# Patient Record
Sex: Female | Born: 1970 | Race: White | Hispanic: No | Marital: Married | Smoking: Never smoker
Health system: Southern US, Community
[De-identification: ages and names within clinical notes are randomized; demographics above are authoritative.]

## PROBLEM LIST (undated history)

## (undated) DIAGNOSIS — R0789 Other chest pain: Secondary | ICD-10-CM

## (undated) DIAGNOSIS — F32A Depression, unspecified: Secondary | ICD-10-CM

## (undated) DIAGNOSIS — Z9889 Other specified postprocedural states: Secondary | ICD-10-CM

## (undated) DIAGNOSIS — I10 Essential (primary) hypertension: Secondary | ICD-10-CM

## (undated) DIAGNOSIS — R112 Nausea with vomiting, unspecified: Secondary | ICD-10-CM

## (undated) DIAGNOSIS — F419 Anxiety disorder, unspecified: Secondary | ICD-10-CM

## (undated) DIAGNOSIS — D649 Anemia, unspecified: Secondary | ICD-10-CM

## (undated) DIAGNOSIS — F329 Major depressive disorder, single episode, unspecified: Secondary | ICD-10-CM

## (undated) HISTORY — DX: Anxiety disorder, unspecified: F41.9

## (undated) HISTORY — DX: Depression, unspecified: F32.A

## (undated) HISTORY — PX: OTHER SURGICAL HISTORY: SHX169

## (undated) HISTORY — DX: Essential (primary) hypertension: I10

## (undated) HISTORY — PX: TUBAL LIGATION: SHX77

## (undated) HISTORY — DX: Major depressive disorder, single episode, unspecified: F32.9

---

## 2006-01-11 ENCOUNTER — Ambulatory Visit: Payer: Self-pay | Admitting: Gastroenterology

## 2006-07-26 ENCOUNTER — Ambulatory Visit: Payer: Self-pay | Admitting: Obstetrics and Gynecology

## 2007-07-23 ENCOUNTER — Ambulatory Visit: Payer: Self-pay | Admitting: Family Medicine

## 2008-10-30 ENCOUNTER — Ambulatory Visit: Payer: Self-pay | Admitting: Unknown Physician Specialty

## 2010-05-25 ENCOUNTER — Other Ambulatory Visit
Admission: RE | Admit: 2010-05-25 | Discharge: 2010-05-25 | Payer: Self-pay | Source: Home / Self Care | Admitting: Obstetrics and Gynecology

## 2010-11-04 ENCOUNTER — Ambulatory Visit: Payer: Self-pay

## 2010-12-16 ENCOUNTER — Telehealth: Payer: Self-pay | Admitting: *Deleted

## 2010-12-16 NOTE — Telephone Encounter (Signed)
ERROR. WRONG PT. NO CONTACT MADE.

## 2011-03-27 ENCOUNTER — Other Ambulatory Visit: Payer: Self-pay | Admitting: Occupational Medicine

## 2011-03-27 ENCOUNTER — Ambulatory Visit
Admission: RE | Admit: 2011-03-27 | Discharge: 2011-03-27 | Disposition: A | Discharge status: Home / Self Care | Payer: Self-pay | Source: Ambulatory Visit | Attending: Occupational Medicine | Admitting: Occupational Medicine

## 2011-03-27 DIAGNOSIS — M25512 Pain in left shoulder: Secondary | ICD-10-CM

## 2011-08-15 ENCOUNTER — Ambulatory Visit: Payer: Self-pay | Admitting: Family Medicine

## 2011-11-28 ENCOUNTER — Other Ambulatory Visit (HOSPITAL_COMMUNITY)
Admission: RE | Admit: 2011-11-28 | Discharge: 2011-11-28 | Disposition: A | Discharge status: Home / Self Care | Payer: BC Managed Care – PPO | Source: Ambulatory Visit | Attending: Obstetrics and Gynecology | Admitting: Obstetrics and Gynecology

## 2011-11-28 DIAGNOSIS — Z01419 Encounter for gynecological examination (general) (routine) without abnormal findings: Secondary | ICD-10-CM | POA: Insufficient documentation

## 2011-11-28 DIAGNOSIS — N760 Acute vaginitis: Secondary | ICD-10-CM | POA: Insufficient documentation

## 2011-11-28 DIAGNOSIS — Z113 Encounter for screening for infections with a predominantly sexual mode of transmission: Secondary | ICD-10-CM | POA: Insufficient documentation

## 2012-11-28 LAB — HM PAP SMEAR

## 2013-01-02 ENCOUNTER — Other Ambulatory Visit: Payer: Self-pay | Admitting: Nurse Practitioner

## 2013-01-02 ENCOUNTER — Other Ambulatory Visit (HOSPITAL_COMMUNITY)
Admission: RE | Admit: 2013-01-02 | Discharge: 2013-01-02 | Disposition: A | Discharge status: Home / Self Care | Payer: BC Managed Care – PPO | Source: Ambulatory Visit | Attending: Obstetrics and Gynecology | Admitting: Obstetrics and Gynecology

## 2013-01-02 DIAGNOSIS — Z01419 Encounter for gynecological examination (general) (routine) without abnormal findings: Secondary | ICD-10-CM | POA: Insufficient documentation

## 2013-01-02 DIAGNOSIS — Z1151 Encounter for screening for human papillomavirus (HPV): Secondary | ICD-10-CM | POA: Insufficient documentation

## 2013-01-02 DIAGNOSIS — Z113 Encounter for screening for infections with a predominantly sexual mode of transmission: Secondary | ICD-10-CM | POA: Insufficient documentation

## 2013-01-02 DIAGNOSIS — R87810 Cervical high risk human papillomavirus (HPV) DNA test positive: Secondary | ICD-10-CM | POA: Insufficient documentation

## 2013-05-08 DIAGNOSIS — R079 Chest pain, unspecified: Secondary | ICD-10-CM

## 2013-05-08 HISTORY — DX: Chest pain, unspecified: R07.9

## 2013-06-25 ENCOUNTER — Ambulatory Visit: Payer: Self-pay | Admitting: Nurse Practitioner

## 2013-07-10 ENCOUNTER — Ambulatory Visit: Payer: Self-pay | Admitting: Nurse Practitioner

## 2013-12-11 ENCOUNTER — Other Ambulatory Visit (HOSPITAL_COMMUNITY)
Admission: RE | Admit: 2013-12-11 | Discharge: 2013-12-11 | Disposition: A | Discharge status: Home / Self Care | Payer: BC Managed Care – PPO | Source: Ambulatory Visit | Attending: Obstetrics and Gynecology | Admitting: Obstetrics and Gynecology

## 2013-12-11 ENCOUNTER — Other Ambulatory Visit: Payer: Self-pay | Admitting: Nurse Practitioner

## 2013-12-11 DIAGNOSIS — Z01419 Encounter for gynecological examination (general) (routine) without abnormal findings: Secondary | ICD-10-CM | POA: Insufficient documentation

## 2013-12-12 LAB — CYTOLOGY - PAP

## 2014-07-10 ENCOUNTER — Ambulatory Visit: Payer: Self-pay | Admitting: Nurse Practitioner

## 2014-09-03 ENCOUNTER — Ambulatory Visit
Admit: 2014-09-03 | Disposition: A | Discharge status: Home / Self Care | Payer: Self-pay | Admitting: Unknown Physician Specialty

## 2014-10-14 ENCOUNTER — Emergency Department
Admission: EM | Admit: 2014-10-14 | Discharge: 2014-10-14 | Disposition: A | Discharge status: Home / Self Care | Payer: BLUE CROSS/BLUE SHIELD | Attending: Emergency Medicine | Admitting: Emergency Medicine

## 2014-10-14 ENCOUNTER — Emergency Department: Payer: BLUE CROSS/BLUE SHIELD

## 2014-10-14 ENCOUNTER — Encounter: Payer: Self-pay | Admitting: Emergency Medicine

## 2014-10-14 DIAGNOSIS — M25562 Pain in left knee: Secondary | ICD-10-CM | POA: Diagnosis present

## 2014-10-14 DIAGNOSIS — M222X2 Patellofemoral disorders, left knee: Secondary | ICD-10-CM | POA: Diagnosis not present

## 2014-10-14 DIAGNOSIS — M1712 Unilateral primary osteoarthritis, left knee: Secondary | ICD-10-CM

## 2014-10-14 MED ORDER — MELOXICAM 15 MG PO TABS: 15.0000 mg | ORAL_TABLET | Freq: Every day | ORAL | Status: DC

## 2014-10-14 MED ORDER — HYDROCODONE-ACETAMINOPHEN 5-325 MG PO TABS: 1.0000 | ORAL_TABLET | Freq: Four times a day (QID) | ORAL | Status: DC | PRN

## 2014-10-14 NOTE — ED Provider Notes (Signed)
[facility] Emergency Department Provider Note  ____________________________________________  Time seen: Approximately 7:26 AM  I have reviewed the triage vital signs and the nursing notes.   HISTORY  Chief Complaint Knee Pain   HPI Alexandra Le is a 44 y.o. female who presents to the emergency department for left knee pain. She states that she developed pain after exercising yesterday morning. She denies having history of knee injury. Pain is worse with flexion. Pain is on the anterior/medial knee.   History reviewed. No pertinent past medical history.  There are no active problems to display for this patient.   History reviewed. No pertinent past surgical history.  Current Outpatient Rx  Name  Route  Sig  Dispense  Refill  . HYDROcodone-acetaminophen (NORCO/VICODIN) 5-325 MG per tablet   Oral   Take 1 tablet by mouth every 6 (six) hours as needed for moderate pain.   12 tablet   0   . meloxicam (MOBIC) 15 MG tablet   Oral   Take 1 tablet (15 mg total) by mouth daily.   30 tablet   2     Allergies Review of patient's allergies indicates no known allergies.  No family history on file.  Social History History  Substance Use Topics  . Smoking status: Never Smoker   . Smokeless tobacco: Not on file  . Alcohol Use: Not on file    Review of Systems Constitutional: No recent illness. Eyes: No visual changes. ENT: No sore throat. Cardiovascular: Denies chest pain or palpitations. Respiratory: Denies shortness of breath. Gastrointestinal: No abdominal pain.  Genitourinary: Negative for dysuria. Musculoskeletal: Pain in left knee Skin: Negative for rash. Neurological: Negative for headaches, focal weakness or numbness. 10-point ROS otherwise negative.  ____________________________________________   PHYSICAL EXAM:  VITAL SIGNS: ED Triage Vitals  Enc Vitals Group     BP 10/14/14 0717 109/73 mmHg     Pulse Rate 10/14/14 0717 78      Resp --      Temp 10/14/14 0717 98.1 F (36.7 C)     Temp Source 10/14/14 0717 Oral     SpO2 10/14/14 0717 98 %     Weight 10/14/14 0717 155 lb (70.308 kg)     Height 10/14/14 0717 5\' 8"  (1.727 m)     Head Cir --      Peak Flow --      Pain Score 10/14/14 0718 9     Pain Loc --      Pain Edu? --      Excl. in [place]? --     Constitutional: Alert and oriented. Well appearing and in no acute distress. Eyes: Conjunctivae are normal. EOMI. Head: Atraumatic. Nose: No congestion/rhinnorhea. Neck: No stridor.  Respiratory: Normal respiratory effort.   Musculoskeletal: mild swelling anterior knee; tenderness along medial joint line;  Neurologic:  Normal speech and language. No gross focal neurologic deficits are appreciated. Speech is normal. No gait instability. Skin:  Skin is warm, dry and intact. Atraumatic. Psychiatric: Mood and affect are normal. Speech and behavior are normal.  ____________________________________________   LABS (all labs ordered are listed, but only abnormal results are displayed)  Labs Reviewed - No data to display ____________________________________________  RADIOLOGY  No acute bony abnormality; degenerative changes ____________________________________________   PROCEDURES  Procedure(s) performed: knee immobilizer applied to left knee by nursing staff   ____________________________________________   INITIAL IMPRESSION / ASSESSMENT AND PLAN / ED COURSE  Pertinent labs & imaging results that were available during my  care of the patient were reviewed by me and considered in my medical decision making (see chart for details).  Patient was advised to call and schedule an appointment with orthopedics.  She was advised to return to the ER for symptoms that change or worsen if unable to see orthopedics. ____________________________________________   FINAL CLINICAL IMPRESSION(S) / ED DIAGNOSES  Final diagnoses:  Patellofemoral arthritis of left  knee      Victorino Dike, FNP 10/14/14 [place], MD 10/19/14 [phone]

## 2014-10-14 NOTE — ED Notes (Signed)
NAD noted at time of D/C. Pt denies questions or concerns. Pt ambulatory to the lobby at this time. Pt refused wheelchair to the lobby.  

## 2014-10-14 NOTE — ED Notes (Signed)
After exercise x1 day , sharp pain , swelling. No injury recalled

## 2014-11-11 ENCOUNTER — Ambulatory Visit: Payer: Self-pay | Admitting: Unknown Physician Specialty

## 2014-11-16 ENCOUNTER — Other Ambulatory Visit: Payer: Self-pay

## 2014-11-16 NOTE — Telephone Encounter (Signed)
Patient was last seen on 05/11/14 for this with no follow-up noted that I saw. Medication was refilled then with 6 refills. Pharmacy is [facility] [address] and practice partner number is [phone].

## 2014-11-17 MED ORDER — SERTRALINE HCL 50 MG PO TABS: 50.0000 mg | ORAL_TABLET | Freq: Every day | ORAL | Status: DC

## 2015-01-25 ENCOUNTER — Other Ambulatory Visit: Payer: Self-pay

## 2015-01-25 MED ORDER — BENAZEPRIL-HYDROCHLOROTHIAZIDE 20-25 MG PO TABS: 1.0000 | ORAL_TABLET | Freq: Every day | ORAL | Status: DC

## 2015-01-25 NOTE — Telephone Encounter (Signed)
Pt needs check further refills 

## 2015-01-25 NOTE — Telephone Encounter (Signed)
Patient was last seen 09/02/14, practice partner number is [phone] and pharmacy is [facility] [address].

## 2015-01-26 NOTE — Telephone Encounter (Signed)
Called and left patient a voicemail asking for her to please return my call and schedule a follow-up appointment.

## 2015-01-27 ENCOUNTER — Other Ambulatory Visit: Payer: Self-pay

## 2015-01-27 MED ORDER — MELOXICAM 15 MG PO TABS: 15.0000 mg | ORAL_TABLET | Freq: Every day | ORAL | Status: DC

## 2015-01-27 NOTE — Telephone Encounter (Signed)
Called and left patient a voicemail asking for her to please return my call and schedule a follow-up visit.

## 2015-01-27 NOTE — Telephone Encounter (Signed)
Pharmacy is [facility] [address]. I have already tried to call this patient to schedule a follow-up visit from a refill we did 2 days ago.

## 2015-01-28 NOTE — Telephone Encounter (Signed)
Called and left patient a voicemail asking for her to please return my call and schedule a follow-up visit.

## 2015-02-03 ENCOUNTER — Telehealth: Payer: Self-pay | Admitting: Unknown Physician Specialty

## 2015-02-03 NOTE — Telephone Encounter (Signed)
Cheryl sent in a 30 day supply of Benazepril on 01/25/15 so I called the pharmacy to make sure they had it and they stated they were going to get it ready. So I then called the patient to let her know that Malachy Mood did write for a 30 day supply so she had plenty to get to her appointment.

## 2015-02-03 NOTE — Telephone Encounter (Signed)
Pt has scheduled an appt for 02/12/15 for med fu. Pt is out of Benazepril, wants to know if enough to last until next Friday can be called into the pharmacy as she is completely out. [facility] [address] in [place]. Thanks.

## 2015-02-11 DIAGNOSIS — F411 Generalized anxiety disorder: Secondary | ICD-10-CM | POA: Insufficient documentation

## 2015-02-11 DIAGNOSIS — I10 Essential (primary) hypertension: Secondary | ICD-10-CM | POA: Insufficient documentation

## 2015-02-11 DIAGNOSIS — F329 Major depressive disorder, single episode, unspecified: Secondary | ICD-10-CM

## 2015-02-11 DIAGNOSIS — F419 Anxiety disorder, unspecified: Principal | ICD-10-CM

## 2015-02-11 DIAGNOSIS — M545 Low back pain, unspecified: Secondary | ICD-10-CM | POA: Insufficient documentation

## 2015-02-11 DIAGNOSIS — F32A Depression, unspecified: Secondary | ICD-10-CM

## 2015-02-11 DIAGNOSIS — G4700 Insomnia, unspecified: Secondary | ICD-10-CM | POA: Insufficient documentation

## 2015-02-12 ENCOUNTER — Ambulatory Visit (INDEPENDENT_AMBULATORY_CARE_PROVIDER_SITE_OTHER): Payer: BLUE CROSS/BLUE SHIELD | Admitting: Unknown Physician Specialty

## 2015-02-12 ENCOUNTER — Encounter: Payer: Self-pay | Admitting: Unknown Physician Specialty

## 2015-02-12 VITALS — BP 135/83 | HR 69 | Temp 98.5°F | Ht 68.0 in | Wt 162.8 lb

## 2015-02-12 DIAGNOSIS — F32A Depression, unspecified: Secondary | ICD-10-CM

## 2015-02-12 DIAGNOSIS — F329 Major depressive disorder, single episode, unspecified: Secondary | ICD-10-CM | POA: Diagnosis not present

## 2015-02-12 DIAGNOSIS — I10 Essential (primary) hypertension: Secondary | ICD-10-CM

## 2015-02-12 MED ORDER — BENAZEPRIL-HYDROCHLOROTHIAZIDE 20-25 MG PO TABS: 1.0000 | ORAL_TABLET | Freq: Every day | ORAL | Status: DC

## 2015-02-12 MED ORDER — SERTRALINE HCL 50 MG PO TABS: 50.0000 mg | ORAL_TABLET | Freq: Every day | ORAL | Status: DC

## 2015-02-12 NOTE — Progress Notes (Signed)
BP 135/83 mmHg  Pulse 69  Temp(Src) 98.5 F (36.9 C)  Ht 5\' 8"  (1.727 m)  Wt 162 lb 12.8 oz (73.846 kg)  BMI 24.76 kg/m2  SpO2 100%  LMP 01/20/2015 (Exact Date)   Subjective:    Patient ID: Alexandra Le, female    DOB: 07/03/1970, 44 y.o.   MRN: [id number]  HPI: Alexandra Le is a 44 y.o. female  Chief Complaint: Medication refill  Hypertension: Stable on current regimen. Compliant with medication, no missed doses. Occasionally monitors blood pressure at home if she feels it is high however this is rare. She denies headaches, chest pain, shortness of breath or dizziness.  Depression: Currently stable with no missed doses. She is concerned weight gain may be related to sertraline and would like to decrease to 1/2 tablet. She is feeling well with no concerns of increased depression with decreasing med. Her PHQ 2 screen is stable, see below.   Depression screen [facility] 2/9 02/12/2015 02/12/2015  Decreased Interest 2 2  Down, Depressed, Hopeless 0 0  PHQ - 2 Score 2 2      Relevant past medical, surgical, family and social history reviewed and updated as indicated. Interim medical history since our last visit reviewed. Allergies and medications reviewed and updated.  Review of Systems  Constitutional: Negative.  Negative for fever, chills, activity change and appetite change.  HENT: Negative.  Negative for postnasal drip, rhinorrhea, sinus pressure, sneezing and sore throat.   Eyes: Negative.  Negative for discharge and redness.  Respiratory: Negative.  Negative for cough, shortness of breath and wheezing.   Cardiovascular: Negative.  Negative for chest pain, palpitations and leg swelling.  Gastrointestinal: Negative.  Negative for nausea, diarrhea and constipation.  Musculoskeletal: Negative.  Negative for myalgias, back pain, joint swelling, arthralgias and gait problem.  Skin: Negative.  Negative for color change, pallor, rash and wound.  Neurological: Negative.  Negative for  dizziness, seizures, weakness, numbness and headaches.  Psychiatric/Behavioral: Positive for sleep disturbance. Negative for confusion, self-injury and agitation. The patient is not nervous/anxious.     Per HPI unless specifically indicated above     Objective:    BP 135/83 mmHg  Pulse 69  Temp(Src) 98.5 F (36.9 C)  Ht 5\' 8"  (1.727 m)  Wt 162 lb 12.8 oz (73.846 kg)  BMI 24.76 kg/m2  SpO2 100%  LMP 01/20/2015 (Exact Date)  Wt Readings from Last 3 Encounters:  02/12/15 162 lb 12.8 oz (73.846 kg)  09/02/14 155 lb (70.308 kg)  10/14/14 155 lb (70.308 kg)    Physical Exam  Constitutional: She is oriented to person, place, and time. She appears well-developed and well-nourished. No distress.  HENT:  Head: Normocephalic and atraumatic.  Eyes: Conjunctivae are normal. Right eye exhibits no discharge. Left eye exhibits no discharge.  Neck: Normal range of motion.  Cardiovascular: Normal rate, regular rhythm and normal heart sounds.  Exam reveals no gallop and no friction rub.   No murmur heard. Pulmonary/Chest: Effort normal and breath sounds normal. No respiratory distress. She has no wheezes. She has no rales. She exhibits no tenderness.  Musculoskeletal: Normal range of motion. She exhibits no edema or tenderness.  Neurological: She is alert and oriented to person, place, and time.  Skin: Skin is warm and dry. No rash noted. She is not diaphoretic. No erythema. No pallor.  Psychiatric: She has a normal mood and affect. Her behavior is normal. Judgment and thought content normal.  Assessment & Plan:   Problem List Items Addressed This Visit      Unprioritized   Hypertension    Stable, continue present medications.       Relevant Medications   benazepril-hydrochlorthiazide (LOTENSIN HCT) 20-25 MG tablet   Depression - Primary    Stable on sertraline. She will decrease to 1/2 tablet.      Relevant Medications   sertraline (ZOLOFT) 50 MG tablet       Follow up  plan: No Follow-up on file.

## 2015-02-12 NOTE — Patient Instructions (Addendum)
.Think you're too busy to work out? We have the workout for you. In minutes, high-intensity interval training (H.I.I.T.) will have you sweating, breathing hard and maximizing the health benefits of exercise without the time commitment. Best of all, it's scientifically proven to work.  What Is H.I.I.T.? SHORT WORKOUTS 101 High-intensity interval training - referred to as H.I.I.T. - is based on the idea that short bursts of strenuous exercise can have a big impact on the body. If moderate exercise - like a 20-minute jog - is good for your heart, lungs and metabolism, H.I.I.T. packs the benefits of that workout and more into a few minutes. It may sound too good to be true, but learning this exercise technique and adapting it to your life can mean saving hours at the gym. If you think you don't have time to exercise, H.I.I.T. may be the workout for you.  You can try it with any aerobic activity you like. The principles of H.I.I.T. can be applied to running, biking, stair climbing, swimming, jumping rope, rowing, even hopping or skipping. (Yes, skipping!)  The downside? Even though H.I.I.T. lasts only minutes, the workouts are tough, requiring you to push your body near its limit.  HOW INTENSE IS HIGH INTENSITY? High-intensity exercise is obviously not a casual stroll down the street, but it's not a run-till-your-lungs-pop explosion, either. Think breathless, not winded. Heart-pounding, not exploding. Legs pumping, but not uncontrolled.  Intermittent fasting  You don't need any fancy heart rate monitors to do these workouts. Use cues from your body as a guide. In the middle of a high-intensity workout you should be able to say single words, but not complete whole sentences. So, if you can keep chatting to your workout partner during this workout, pump it up a few notches.  02-24-29 Training This simple program will help you make the most of a short workout by improving heart health and endurance. Try it  with your favorite cardiovascular activity. The essentials of 02-24-29 training are simple. Run, ride or perhaps row on a rowing machine gently for 30 seconds, accelerate to a moderate pace for 20 seconds, then sprint as hard as you can for 10 seconds. (It should be called 30-20-10 training, obviously, but that is not as catchy.) Repeat.  You don't even need a stopwatch to monitor the 30-, 20-, and 10-second time changes. You can just count to yourself, which seems to make the intervals pass more quickly.  Best of all? The grueling, all-out portion of the workout lasts for only 10 seconds. C'mon, you can do anything for 10 seconds, right?  Got 10 Minutes? A solitary minute of hard work buried in 10 minutes of activity can make a big difference.  The 10-Minute Workout If you like to run, bike, row or swim - just a little bit - this workout is a great option for you. Step 1 Warm up for 2 minutes Step 2 Pedal, run or swim all-out for 20 seconds. Repeat 2 more times Warm down for 3 minutes    GET STARTED To benefit the most from really, really short workouts, you need to build the habit of doing them into your hectic life. Ideally, you'll complete the workout three times a week. The best way to build that habit is to start small and be willing to tweak your schedule where you can to accommodate your new workout.  First set up a spot in your house for your workout, equipped with whatever you need to get the job  done: sneakers, a chair, a towel, etc. Then slot your workout in before you would normally shower. (You can even do it in the bathroom.) Or wake up five minutes earlier and do it first thing in the morning, so you can head off to work feeling accomplished. Or do it during your lunch hour. Run up your office's stairs or grab a private conference room for just a few minutes. Or work it into your commute. If you walk or bike to work, add some heavy intervals on the way home.  GET A BOOST FROM  MUSIC Creating a workout playlist of high-energy tunes you love will not make your workout feel easier, but it may cause you to exercise harder without even realizing it. Best of all, if you are doing a really short workout, you need only one or two great tunes to get you through. If you are willing to try something a bit different, make your own music as you exercise. Sing, hum, clap your hands, whatever you can do to jam along to your playlist. It may give you an extra boost to finish strong.  Find a song or podcast that's the length of your really, really short workout. By the time the song is over, you're done.  Excerpted from the [place] Well column http://www.nytimes.com/well/guides/really-really-short-workouts?smid=fb-nytwell&smtyp=pay  Google Intermittent Fasting

## 2015-02-12 NOTE — Assessment & Plan Note (Signed)
Stable, continue present medications.   

## 2015-02-12 NOTE — Assessment & Plan Note (Signed)
Stable on sertraline. She will decrease to 1/2 tablet.

## 2015-03-11 ENCOUNTER — Other Ambulatory Visit: Payer: Self-pay | Admitting: Nurse Practitioner

## 2015-03-25 ENCOUNTER — Other Ambulatory Visit: Payer: Self-pay | Admitting: Nurse Practitioner

## 2015-04-29 ENCOUNTER — Inpatient Hospital Stay (HOSPITAL_COMMUNITY): Admission: RE | Admit: 2015-04-29 | Payer: BC Managed Care – PPO | Source: Ambulatory Visit

## 2015-04-29 ENCOUNTER — Encounter (HOSPITAL_COMMUNITY): Payer: Self-pay

## 2015-04-29 ENCOUNTER — Other Ambulatory Visit: Payer: Self-pay

## 2015-04-29 ENCOUNTER — Other Ambulatory Visit (HOSPITAL_COMMUNITY): Payer: Self-pay | Admitting: Obstetrics and Gynecology

## 2015-04-29 ENCOUNTER — Encounter (HOSPITAL_COMMUNITY)
Admission: RE | Admit: 2015-04-29 | Discharge: 2015-04-29 | Disposition: A | Discharge status: Home / Self Care | Payer: BLUE CROSS/BLUE SHIELD | Source: Ambulatory Visit | Attending: Obstetrics and Gynecology | Admitting: Obstetrics and Gynecology

## 2015-04-29 DIAGNOSIS — D069 Carcinoma in situ of cervix, unspecified: Secondary | ICD-10-CM | POA: Diagnosis present

## 2015-04-29 DIAGNOSIS — F17210 Nicotine dependence, cigarettes, uncomplicated: Secondary | ICD-10-CM | POA: Diagnosis not present

## 2015-04-29 DIAGNOSIS — I10 Essential (primary) hypertension: Secondary | ICD-10-CM | POA: Diagnosis not present

## 2015-04-29 HISTORY — DX: Anemia, unspecified: D64.9

## 2015-04-29 HISTORY — DX: Other chest pain: R07.89

## 2015-04-29 LAB — CBC
HCT: 42 % (ref 36.0–46.0)
Hemoglobin: 14.5 g/dL (ref 12.0–15.0)
MCH: 30.9 pg (ref 26.0–34.0)
MCHC: 34.5 g/dL (ref 30.0–36.0)
MCV: 89.6 fL (ref 78.0–100.0)
PLATELETS: 278 10*3/uL (ref 150–400)
RBC: 4.69 MIL/uL (ref 3.87–5.11)
RDW: 12.7 % (ref 11.5–15.5)
WBC: 6.8 10*3/uL (ref 4.0–10.5)

## 2015-04-29 LAB — BASIC METABOLIC PANEL
Anion gap: 8 (ref 5–15)
BUN: 13 mg/dL (ref 6–20)
CALCIUM: 9 mg/dL (ref 8.9–10.3)
CO2: 29 mmol/L (ref 22–32)
CREATININE: 0.71 mg/dL (ref 0.44–1.00)
Chloride: 98 mmol/L — ABNORMAL LOW (ref 101–111)
Glucose, Bld: 101 mg/dL — ABNORMAL HIGH (ref 65–99)
Potassium: 3.6 mmol/L (ref 3.5–5.1)
Sodium: 135 mmol/L (ref 135–145)

## 2015-04-29 NOTE — Patient Instructions (Addendum)
Your procedure is scheduled on:04/30/15  Enter through the Main Entrance at :10:00 am Pick up desk phone and dial [phone] and inform us of your arrival.  Please call [phone] if you have any problems the morning of surgery.  Remember: Do not eat food after midnight:tonight Clear liquids are ok until:7am Friday   You may brush your teeth the morning of surgery.  Take these meds the morning of surgery with a sip of water: Sertraline and BP med  DO NOT wear jewelry, eye make-up, lipstick,body lotion, or dark fingernail polish.  (Polished toes are ok) You may wear deodorant.   Patients discharged on the day of surgery will not be allowed to drive home. Wear loose fitting, comfortable clothes for your ride home.

## 2015-04-30 ENCOUNTER — Encounter (HOSPITAL_COMMUNITY)
Admission: RE | Disposition: A | Discharge status: Home / Self Care | Payer: Self-pay | Source: Ambulatory Visit | Attending: Obstetrics and Gynecology

## 2015-04-30 ENCOUNTER — Encounter (HOSPITAL_COMMUNITY): Payer: Self-pay | Admitting: Emergency Medicine

## 2015-04-30 ENCOUNTER — Ambulatory Visit (HOSPITAL_COMMUNITY): Payer: BLUE CROSS/BLUE SHIELD | Admitting: Anesthesiology

## 2015-04-30 ENCOUNTER — Ambulatory Visit (HOSPITAL_COMMUNITY)
Admission: RE | Admit: 2015-04-30 | Discharge: 2015-04-30 | Disposition: A | Discharge status: Home / Self Care | Payer: BLUE CROSS/BLUE SHIELD | Source: Ambulatory Visit | Attending: Obstetrics and Gynecology | Admitting: Obstetrics and Gynecology

## 2015-04-30 DIAGNOSIS — I10 Essential (primary) hypertension: Secondary | ICD-10-CM | POA: Insufficient documentation

## 2015-04-30 DIAGNOSIS — D069 Carcinoma in situ of cervix, unspecified: Secondary | ICD-10-CM | POA: Insufficient documentation

## 2015-04-30 DIAGNOSIS — F17210 Nicotine dependence, cigarettes, uncomplicated: Secondary | ICD-10-CM | POA: Insufficient documentation

## 2015-04-30 HISTORY — DX: Other specified postprocedural states: R11.2

## 2015-04-30 HISTORY — DX: Other specified postprocedural states: Z98.890

## 2015-04-30 HISTORY — PX: CERVICAL CONIZATION W/BX: SHX1330

## 2015-04-30 LAB — PREGNANCY, URINE: PREG TEST UR: NEGATIVE

## 2015-04-30 SURGERY — CONE BIOPSY, CERVIX
Anesthesia: General

## 2015-04-30 MED ORDER — LIDOCAINE-EPINEPHRINE 1 %-1:100000 IJ SOLN
INTRAMUSCULAR | Status: AC
  Filled 2015-04-30: qty 1

## 2015-04-30 MED ORDER — FENTANYL CITRATE (PF) 100 MCG/2ML IJ SOLN
INTRAMUSCULAR | Status: DC | PRN
  Administered 2015-04-30: 50 ug via INTRAVENOUS
  Administered 2015-04-30 (×2): 25 ug via INTRAVENOUS
  Administered 2015-04-30: 50 ug via INTRAVENOUS

## 2015-04-30 MED ORDER — FENTANYL CITRATE (PF) 100 MCG/2ML IJ SOLN: 25.0000 ug | INTRAMUSCULAR | Status: DC | PRN

## 2015-04-30 MED ORDER — IBUPROFEN 800 MG PO TABS: ORAL_TABLET | ORAL | Status: DC

## 2015-04-30 MED ORDER — PHENYLEPHRINE 40 MCG/ML (10ML) SYRINGE FOR IV PUSH (FOR BLOOD PRESSURE SUPPORT)
PREFILLED_SYRINGE | INTRAVENOUS | Status: AC
  Filled 2015-04-30: qty 10

## 2015-04-30 MED ORDER — ONDANSETRON HCL 4 MG/2ML IJ SOLN
INTRAMUSCULAR | Status: AC
  Filled 2015-04-30: qty 2

## 2015-04-30 MED ORDER — SUCCINYLCHOLINE CHLORIDE 20 MG/ML IJ SOLN
INTRAMUSCULAR | Status: AC
  Filled 2015-04-30: qty 1

## 2015-04-30 MED ORDER — SUCCINYLCHOLINE CHLORIDE 20 MG/ML IJ SOLN
INTRAMUSCULAR | Status: DC | PRN
  Administered 2015-04-30: 120 mg via INTRAVENOUS

## 2015-04-30 MED ORDER — PROPOFOL 10 MG/ML IV BOLUS
INTRAVENOUS | Status: AC
  Filled 2015-04-30: qty 20

## 2015-04-30 MED ORDER — SCOPOLAMINE 1 MG/3DAYS TD PT72
1.0000 | MEDICATED_PATCH | Freq: Once | TRANSDERMAL | Status: DC
Start: 2015-04-30 — End: 2015-04-30
  Administered 2015-04-30: 1.5 mg via TRANSDERMAL

## 2015-04-30 MED ORDER — MIDAZOLAM HCL 2 MG/2ML IJ SOLN
INTRAMUSCULAR | Status: DC | PRN
  Administered 2015-04-30: 2 mg via INTRAVENOUS

## 2015-04-30 MED ORDER — FERRIC SUBSULFATE SOLN
Status: DC | PRN
  Administered 2015-04-30: 2

## 2015-04-30 MED ORDER — ACETIC ACID 5 % SOLN
Status: AC
Start: 2015-04-30 — End: 2015-04-30
  Filled 2015-04-30: qty 500

## 2015-04-30 MED ORDER — ONDANSETRON HCL 4 MG/2ML IJ SOLN: 4.0000 mg | Freq: Once | INTRAMUSCULAR | Status: DC | PRN

## 2015-04-30 MED ORDER — FERRIC SUBSULFATE 259 MG/GM EX SOLN
CUTANEOUS | Status: AC
  Filled 2015-04-30: qty 8

## 2015-04-30 MED ORDER — FENTANYL CITRATE (PF) 100 MCG/2ML IJ SOLN
INTRAMUSCULAR | Status: AC
  Filled 2015-04-30: qty 2

## 2015-04-30 MED ORDER — LIDOCAINE-EPINEPHRINE 1 %-1:100000 IJ SOLN
INTRAMUSCULAR | Status: DC | PRN
Start: 2015-04-30 — End: 2015-04-30
  Administered 2015-04-30: 20 mL

## 2015-04-30 MED ORDER — LIDOCAINE HCL (CARDIAC) 20 MG/ML IV SOLN
INTRAVENOUS | Status: AC
  Filled 2015-04-30: qty 5

## 2015-04-30 MED ORDER — HYDROCODONE-ACETAMINOPHEN 5-325 MG PO TABS: 1.0000 | ORAL_TABLET | Freq: Four times a day (QID) | ORAL | Status: DC | PRN

## 2015-04-30 MED ORDER — DEXAMETHASONE SODIUM PHOSPHATE 4 MG/ML IJ SOLN
INTRAMUSCULAR | Status: AC
  Filled 2015-04-30: qty 1

## 2015-04-30 MED ORDER — ONDANSETRON HCL 4 MG/2ML IJ SOLN
INTRAMUSCULAR | Status: DC | PRN
  Administered 2015-04-30: 4 mg via INTRAVENOUS

## 2015-04-30 MED ORDER — PROPOFOL 10 MG/ML IV BOLUS
INTRAVENOUS | Status: DC | PRN
  Administered 2015-04-30: 150 mg via INTRAVENOUS
  Administered 2015-04-30: 50 mg via INTRAVENOUS

## 2015-04-30 MED ORDER — SCOPOLAMINE 1 MG/3DAYS TD PT72
MEDICATED_PATCH | TRANSDERMAL | Status: AC
  Administered 2015-04-30: 1.5 mg via TRANSDERMAL
  Filled 2015-04-30: qty 1

## 2015-04-30 MED ORDER — LACTATED RINGERS IV SOLN
INTRAVENOUS | Status: DC
  Administered 2015-04-30 (×2): via INTRAVENOUS

## 2015-04-30 MED ORDER — KETOROLAC TROMETHAMINE 30 MG/ML IJ SOLN
INTRAMUSCULAR | Status: AC
  Filled 2015-04-30: qty 1

## 2015-04-30 MED ORDER — DEXAMETHASONE SODIUM PHOSPHATE 10 MG/ML IJ SOLN
INTRAMUSCULAR | Status: DC | PRN
  Administered 2015-04-30: 4 mg via INTRAVENOUS

## 2015-04-30 MED ORDER — LIDOCAINE HCL (CARDIAC) 20 MG/ML IV SOLN
INTRAVENOUS | Status: DC | PRN
  Administered 2015-04-30: 60 mg via INTRAVENOUS

## 2015-04-30 MED ORDER — IODINE STRONG (LUGOLS) 5 % PO SOLN
ORAL | Status: AC
  Filled 2015-04-30: qty 1

## 2015-04-30 MED ORDER — IODINE STRONG (LUGOLS) 5 % PO SOLN
ORAL | Status: DC | PRN
  Administered 2015-04-30: 0.1 mL via ORAL

## 2015-04-30 MED ORDER — MIDAZOLAM HCL 2 MG/2ML IJ SOLN
INTRAMUSCULAR | Status: AC
  Filled 2015-04-30: qty 2

## 2015-04-30 MED ORDER — KETOROLAC TROMETHAMINE 30 MG/ML IJ SOLN
INTRAMUSCULAR | Status: DC | PRN
  Administered 2015-04-30: 30 mg via INTRAVENOUS

## 2015-04-30 MED ORDER — PHENYLEPHRINE HCL 10 MG/ML IJ SOLN
INTRAMUSCULAR | Status: DC | PRN
  Administered 2015-04-30 (×2): 40 ug via INTRAVENOUS

## 2015-04-30 SURGICAL SUPPLY — 29 items
BLADE SURG 11 STRL SS (BLADE) ×2 IMPLANT
CATH ROBINSON RED A/P 16FR (CATHETERS) ×2 IMPLANT
CLOTH BEACON ORANGE TIMEOUT ST (SAFETY) ×2 IMPLANT
CONTAINER PREFILL 10% NBF 60ML (FORM) ×2 IMPLANT
COUNTER NEEDLE 1200 MAGNETIC (NEEDLE) ×2 IMPLANT
ELECT BALL LEEP 5MM RED (ELECTRODE) ×2 IMPLANT
ELECT REM PT RETURN 9FT ADLT (ELECTROSURGICAL) ×2
ELECTRODE REM PT RTRN 9FT ADLT (ELECTROSURGICAL) ×1 IMPLANT
GAUZE SPONGE 4X4 16PLY XRAY LF (GAUZE/BANDAGES/DRESSINGS) ×2 IMPLANT
GLOVE BIOGEL M 6.5 STRL (GLOVE) ×6 IMPLANT
GLOVE BIOGEL PI IND STRL 6.5 (GLOVE) ×1 IMPLANT
GLOVE BIOGEL PI IND STRL 7.0 (GLOVE) ×1 IMPLANT
GLOVE BIOGEL PI INDICATOR 6.5 (GLOVE) ×1
GLOVE BIOGEL PI INDICATOR 7.0 (GLOVE) ×1
GOWN STRL REUS W/TWL LRG LVL3 (GOWN DISPOSABLE) ×4 IMPLANT
HEMOSTAT SURGICEL 2X3 (HEMOSTASIS) ×2 IMPLANT
NS IRRIG 1000ML POUR BTL (IV SOLUTION) ×2 IMPLANT
PACK VAGINAL MINOR WOMEN LF (CUSTOM PROCEDURE TRAY) ×2 IMPLANT
PAD OB MATERNITY 4.3X12.25 (PERSONAL CARE ITEMS) ×2 IMPLANT
PENCIL BUTTON HOLSTER BLD 10FT (ELECTRODE) ×2 IMPLANT
SCOPETTES 8  STERILE (MISCELLANEOUS) ×2
SCOPETTES 8 STERILE (MISCELLANEOUS) ×2 IMPLANT
SPONGE SURGIFOAM ABS GEL 12-7 (HEMOSTASIS) IMPLANT
SUT VIC AB 0 CT1 27 (SUTURE) ×4
SUT VIC AB 0 CT1 27XBRD ANBCTR (SUTURE) ×4 IMPLANT
TOWEL OR 17X24 6PK STRL BLUE (TOWEL DISPOSABLE) ×4 IMPLANT
TUBING NON-CON 1/4 X 20 CONN (TUBING) ×2 IMPLANT
WATER STERILE IRR 1000ML POUR (IV SOLUTION) ×2 IMPLANT
YANKAUER SUCT BULB TIP NO VENT (SUCTIONS) ×2 IMPLANT

## 2015-04-30 NOTE — Anesthesia Procedure Notes (Signed)
Procedure Name: Intubation Date/Time: 04/30/2015 11:42 AM Performed by: Raenette Rover Pre-anesthesia Checklist: Patient identified, Emergency Drugs available, Suction available and Patient being monitored Patient Re-evaluated:Patient Re-evaluated prior to inductionOxygen Delivery Method: Circle system utilized Preoxygenation: Pre-oxygenation with 100% oxygen Intubation Type: IV induction Ventilation: Mask ventilation without difficulty Laryngoscope Size: 3 and Mac Grade View: Grade I Tube type: Oral Tube size: 7.0 mm Number of attempts: 1 Airway Equipment and Method: Stylet Placement Confirmation: ETT inserted through vocal cords under direct vision,  positive ETCO2,  CO2 detector and breath sounds checked- equal and bilateral Secured at: 21 cm Tube secured with: Tape Dental Injury: Teeth and Oropharynx as per pre-operative assessment  Comments: LMA 4 Unique placement attempted x2 by CRNA with no success; easy mask ventilation; easy DL with MAC 3 and intubation by CRNA; gums and dental structures remain unchanged.

## 2015-04-30 NOTE — Transfer of Care (Signed)
Immediate Anesthesia Transfer of Care Note  Patient: Alexandra Le  Procedure(s) Performed: Procedure(s): CONIZATION CERVIX WITH BIOPSY (N/A)  Patient Location: PACU  Anesthesia Type:General  Level of Consciousness: awake, alert , oriented and patient cooperative  Airway & Oxygen Therapy: Patient Spontanous Breathing and Patient connected to nasal cannula oxygen  Post-op Assessment: Report given to RN and Post -op Vital signs reviewed and stable  Post vital signs: Reviewed and stable  Last Vitals:  Filed Vitals:   04/30/15 1008  BP: 126/87  Pulse: 76  Temp: 36.7 C  Resp: 18    Complications: No apparent anesthesia complications

## 2015-04-30 NOTE — H&P (Signed)
for Appointment  1. discuss colpo results & possible cone Bx   History of Present Illness  General:  44 y/o patient of NP Kess Thongteum presents to discuss management of CIN II and III. she had a cervical polyp removed by NP on 03/11/2015 that revealed CIN II. SHe had a colposcopy on 11/71/2016 that revealed CIN II at 01/16/11 oclock and CIN III at 7 oclock . HEr ecc was benign.  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 she had a pap smear 12/2013 that was negative.  she has a remote h/o LEEP procedure.     Current Medications  Taking   MVI as directed   Benazepril-Hydrochlorothiazide 10-12.5 MG Tablet 1 tablet Once a day   Sertraline HCl 100 MG Tablet 1 tablet Once a day   Medication List reviewed and reconciled with the patient    Past Medical History  Anxiety  Depression  Pyloric stenosis  Hypertension   Surgical History  BTL    Family History  Father: deceased  Mother: alive  Son(s): alive, 3 sons, one is adopted  denies any GYN family cancer hx, Pt is adpoted 3 sons, one is adopted.   Social History  General:  Tobacco use cigarettes: Never smoked, Tobacco history last updated 04/12/2015. no EXPOSURE TO PASSIVE SMOKE. Alcohol: yes, occasionally. Caffeine: yes, coffee, tea, occasionally. no Recreational drug use. Exercise: yes, intermittent, trying to get back into it. Marital Status: widowed 2008, currently committed relationship. Children: Boys, 3, one is adopted. OCCUPATION: employed, Museum/gallery exhibitions officer. Tobacco Exposure: Passive smoking.    Gyn History  Sexual activity currently sexually active- one partners.  Periods : regular.  Birth control BTL.  Last pap smear date 8.6.15 - WNL.  Last mammogram date beginning of yr 2016 mammogram & u/s, September 2016- benign & showed dense breast.  Abnormal pap smear 01/02/13, pap neg, + HPV yes - follow up uncertain - LEEP hx.  STD HPV.  GYN procedures 03/11/15 -polyp removed - HGSIL/CIN II.     OB History  OB History G2 P2.  Pregnancy # 1 live birth, vaginal delivery, boy.  Pregnancy # 2 live birth, vaginal delivery, boy.    Allergies  N.K.D.A.   Hospitalization/Major Diagnostic Procedure  child birth x 2 vaginal    Review of Systems  CONSTITUTIONAL:  Fatigue none. Fever none today.  SKIN:  Rash no. Suspicious lesions no.  CARDIOLOGY:  Chest pain none.  RESPIRATORY:  Shortness of breath no. Cough no.  GASTROENTEROLOGY:  Appetite change none. Change in bowel habits no.  FEMALE REPRODUCTIVE:  Breast lumps or discharge no. Breast pain none. Dyspareunia none. Dysuria no. Pelvic pain none. Regular menses yes. Unusual vaginal discharge no. Vaginal itching no. Vulvar/labial lesion no.  NEUROLOGY:  Migraines none. Tingling/numbness none. Visual changes none.  PSYCHOLOGY:  Depression no.  ENDOCRINOLOGY:  Hot flashes none. Weight gain no unintentional. Weight loss none.  HEMATOLOGY/LYMPH:  Anemia no.      Vital Signs  Wt 168, Wt change 8 lb, Pulse sitting 61, BP sitting 124/78.   Physical Examination  GENERAL:  Patient appears in NAD, pleasant. Build: well developed. General Appearance: overweight. Race: caucasian.  LUNGS:  Breath sounds: clear to auscultation. Dyspnea: no.  HEART:  Murmurs: none. Rate: normal. Rhythm: regular.  ABDOMEN:  General: no masses,tenderness,organomegaly, no CVAT.  FEMALE GENITOURINARY:  Adnexa: no mass, non tender. Anus/perineum: normal, no lesions. Cervix/ cuff: normal appearance , no lesions/discharge/bleeding, , good pelvic support , external  os normal . External genitalia: normal, no lesions, no skin discoloration, no lymphadenopathy. Urethra: normal external meatus. Uterus: normal size/shape/consistency, freely mobile, non tender. Rectum: deferred. Vagina: pink/moist mucosa, no lesions, no abnormal discharge, odorless. Vulva: normal, no lesions, no skin discoloration, non tender.  EXTREMITIES:  Extremities FROM of all  extremities.  NEUROLOGICAL:  Gait: normal. Orientation: alert and oriented x 3.     Assessments   1. 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 CIN III (cervical intraepithelial neoplasia grade III) with severe dysplasia  Notes: recommend cold knife conization. r/b/a of surgery were discussed including but not limited to infection bleeding damage to surrounding organs with the need for further surgery. pt voiced understanding and desires to proceed.  Referral II:[id number] [facility]necology Reason:check insurance coverage of cold knife conization.     2. CIN II (cervical intraepithelial neoplasia II)  Referral II:[id number] [facility] Reason:check insurance coverage of cold knife conization.

## 2015-04-30 NOTE — Op Note (Signed)
04/30/2015  12:45 PM  PATIENT:  Alexandra Le  44 y.o. female  PRE-OPERATIVE DIAGNOSIS:  CERVICAL INTRAEPITHELIAL NEOPLASM 11 AND 111  POST-OPERATIVE DIAGNOSIS:  CERVICAL INTRAEPITHELIAL NEOPLASM 11 AND 111  PROCEDURE:  Procedure(s): CONIZATION CERVIX WITH BIOPSY (N/A)  SURGEON:  Surgeon(s) and Role:    * Christophe Louis, MD - Primary  PHYSICIAN ASSISTANT: None  ASSISTANTS: none   ANESTHESIA:   general  EBL:  Total I/O In: 1500 [I.V.:1500] Out: 250 [Urine:200; Blood:50]  BLOOD ADMINISTERED:none  DRAINS: none   LOCAL MEDICATIONS USED:  LIDOCAINE   SPECIMEN:  Source of Specimen:  cervical cone biopsy   DISPOSITION OF SPECIMEN:  PATHOLOGY  COUNTS:  YES  TOURNIQUET:  * No tourniquets in log *  DICTATION: .Dragon Dictation  PLAN OF CARE: Discharge to home after PACU  PATIENT DISPOSITION:  PACU - hemodynamically stable.   Delay start of Pharmacological VTE agent (>24 hrs) due to surgical blood loss or risk of bleeding: not applicable  Findings: Aceto-white area noted at 12  to 7 o'clock position...   Procedure: Pt was taken to the operating room where she was placed under general anesthesia. Time out was performed. She was prepped and draped in a sterile fashion. Speculum was placed. The anterior lip of the cervix was grasped with single toothed tenaculum.  10 cc of 1% lidocaine with epi was placed at the 4 and 8 o'clock positions. A suture of 0 vicryl was placed at the 3 and 9 o'clock positions and used for retraction. The single toothed tenaculum was removed. Lugol's  Was placed on the cervix with the findings noted above. Scalpel was used to excise a cone portion of the cervix. The specimen was marked with a suture at the 12 o'clock position and sent to pathology. Hemostasis was obtained with roller ball and Monsel. surgicel was placed in the wound bed.  Excellent hemostasis was noted.   Sponge lap and needle counts were correct x 2. Pt was awakened from anesthesia and taken  to the recovery room in stable condition.

## 2015-04-30 NOTE — H&P (Signed)
Date of Initial H&P: 04/30/2015   she had surgery for pyloric stenosis   History reviewed, patient examined, no change in status, stable for surgery.

## 2015-04-30 NOTE — Discharge Instructions (Signed)

## 2015-04-30 NOTE — Anesthesia Postprocedure Evaluation (Signed)
Anesthesia Post Note  Patient: Alexandra Le  Procedure(s) Performed: Procedure(s) (LRB): CONIZATION CERVIX WITH BIOPSY (N/A)  Patient location during evaluation: PACU Anesthesia Type: General Level of consciousness: awake and alert Pain management: pain level controlled Vital Signs Assessment: post-procedure vital signs reviewed and stable Respiratory status: spontaneous breathing, nonlabored ventilation, respiratory function stable and patient connected to nasal cannula oxygen Cardiovascular status: blood pressure returned to baseline and stable Postop Assessment: no signs of nausea or vomiting Anesthetic complications: no    Last Vitals:  Filed Vitals:   04/30/15 1400 04/30/15 1415  BP: 99/71 99/71  Pulse: 77 71  Temp:  36.8 C  Resp: 14 15    Last Pain: There were no vitals filed for this visit.               Josephus Harriger JENNETTE

## 2015-04-30 NOTE — Anesthesia Preprocedure Evaluation (Signed)
Anesthesia Evaluation  Patient identified by MRN, date of birth, ID band Patient awake    Reviewed: Allergy & Precautions, NPO status , Patient's Chart, lab work & pertinent test results  History of Anesthesia Complications Negative for: history of anesthetic complications  Airway Mallampati: II  TM Distance: >3 FB Neck ROM: Full    Dental no notable dental hx. (+) Dental Advisory Given   Pulmonary neg pulmonary ROS,    Pulmonary exam normal breath sounds clear to auscultation       Cardiovascular hypertension, Pt. on medications Normal cardiovascular exam Rhythm:Regular Rate:Normal     Neuro/Psych PSYCHIATRIC DISORDERS Anxiety Depression negative neurological ROS     GI/Hepatic negative GI ROS, Neg liver ROS,   Endo/Other  negative endocrine ROS  Renal/GU negative Renal ROS  negative genitourinary   Musculoskeletal negative musculoskeletal ROS (+)   Abdominal   Peds negative pediatric ROS (+)  Hematology  (+) anemia ,   Anesthesia Other Findings   Reproductive/Obstetrics negative OB ROS                             Anesthesia Physical Anesthesia Plan  ASA: II  Anesthesia Plan: General   Post-op Pain Management:    Induction: Intravenous  Airway Management Planned: LMA  Additional Equipment:   Intra-op Plan:   Post-operative Plan: Extubation in OR  Informed Consent: I have reviewed the patients History and Physical, chart, labs and discussed the procedure including the risks, benefits and alternatives for the proposed anesthesia with the patient or authorized representative who has indicated his/her understanding and acceptance.   Dental advisory given  Plan Discussed with: CRNA  Anesthesia Plan Comments:         Anesthesia Quick Evaluation

## 2015-05-03 ENCOUNTER — Encounter (HOSPITAL_COMMUNITY): Payer: Self-pay | Admitting: Obstetrics and Gynecology

## 2015-05-30 ENCOUNTER — Other Ambulatory Visit: Payer: Self-pay | Admitting: Unknown Physician Specialty

## 2015-06-15 ENCOUNTER — Other Ambulatory Visit: Payer: Self-pay | Admitting: Unknown Physician Specialty

## 2015-09-15 ENCOUNTER — Other Ambulatory Visit: Payer: Self-pay | Admitting: Unknown Physician Specialty

## 2015-10-11 ENCOUNTER — Other Ambulatory Visit: Payer: Self-pay

## 2015-10-11 MED ORDER — BENAZEPRIL-HYDROCHLOROTHIAZIDE 20-25 MG PO TABS: 1.0000 | ORAL_TABLET | Freq: Every day | ORAL | Status: DC

## 2015-10-11 MED ORDER — SERTRALINE HCL 100 MG PO TABS: 100.0000 mg | ORAL_TABLET | Freq: Every day | ORAL | Status: DC

## 2015-10-11 NOTE — Telephone Encounter (Signed)
Due for follow up

## 2015-10-11 NOTE — Telephone Encounter (Signed)
Called and spoke to patient because [facility] was not listed in her chart. She states she is now having to get her prescriptions from them per insurance.

## 2015-10-12 NOTE — Telephone Encounter (Signed)
Called and left patient a voicemail asking for her to please return my call.  

## 2015-10-12 NOTE — Telephone Encounter (Signed)
Patient called and left me a voicemail asking for me to call her back on her work number. So I did, and I scheduled her an appointment for 10/26/15.

## 2015-10-14 ENCOUNTER — Other Ambulatory Visit: Payer: Self-pay | Admitting: Obstetrics and Gynecology

## 2015-10-14 DIAGNOSIS — N6489 Other specified disorders of breast: Secondary | ICD-10-CM

## 2015-10-19 ENCOUNTER — Other Ambulatory Visit: Payer: Self-pay | Admitting: Nurse Practitioner

## 2015-10-19 ENCOUNTER — Other Ambulatory Visit (HOSPITAL_COMMUNITY)
Admission: RE | Admit: 2015-10-19 | Discharge: 2015-10-19 | Disposition: A | Discharge status: Home / Self Care | Payer: BLUE CROSS/BLUE SHIELD | Source: Ambulatory Visit | Attending: Nurse Practitioner | Admitting: Nurse Practitioner

## 2015-10-19 DIAGNOSIS — Z1151 Encounter for screening for human papillomavirus (HPV): Secondary | ICD-10-CM | POA: Insufficient documentation

## 2015-10-19 DIAGNOSIS — Z01419 Encounter for gynecological examination (general) (routine) without abnormal findings: Secondary | ICD-10-CM | POA: Diagnosis present

## 2015-10-20 LAB — CYTOLOGY - PAP

## 2015-10-22 ENCOUNTER — Ambulatory Visit
Admission: RE | Admit: 2015-10-22 | Discharge: 2015-10-22 | Disposition: A | Discharge status: Home / Self Care | Payer: BLUE CROSS/BLUE SHIELD | Source: Ambulatory Visit | Attending: Obstetrics and Gynecology | Admitting: Obstetrics and Gynecology

## 2015-10-22 DIAGNOSIS — N6489 Other specified disorders of breast: Secondary | ICD-10-CM

## 2015-10-26 ENCOUNTER — Ambulatory Visit (INDEPENDENT_AMBULATORY_CARE_PROVIDER_SITE_OTHER): Payer: BLUE CROSS/BLUE SHIELD | Admitting: Unknown Physician Specialty

## 2015-10-26 ENCOUNTER — Encounter: Payer: Self-pay | Admitting: Unknown Physician Specialty

## 2015-10-26 VITALS — BP 137/87 | HR 65 | Temp 98.3°F | Ht 67.3 in | Wt 169.4 lb

## 2015-10-26 DIAGNOSIS — F329 Major depressive disorder, single episode, unspecified: Secondary | ICD-10-CM

## 2015-10-26 DIAGNOSIS — I450 Right fascicular block: Secondary | ICD-10-CM | POA: Diagnosis not present

## 2015-10-26 DIAGNOSIS — M545 Low back pain: Secondary | ICD-10-CM

## 2015-10-26 DIAGNOSIS — F32A Depression, unspecified: Secondary | ICD-10-CM

## 2015-10-26 DIAGNOSIS — Z6829 Body mass index (BMI) 29.0-29.9, adult: Secondary | ICD-10-CM | POA: Insufficient documentation

## 2015-10-26 DIAGNOSIS — R635 Abnormal weight gain: Secondary | ICD-10-CM

## 2015-10-26 DIAGNOSIS — I4510 Unspecified right bundle-branch block: Secondary | ICD-10-CM

## 2015-10-26 MED ORDER — BUPROPION HCL ER (SR) 150 MG PO TB12: 150.0000 mg | ORAL_TABLET | Freq: Two times a day (BID) | ORAL | Status: DC

## 2015-10-26 MED ORDER — SERTRALINE HCL 100 MG PO TABS: 100.0000 mg | ORAL_TABLET | Freq: Every day | ORAL | Status: DC

## 2015-10-26 MED ORDER — TRAMADOL HCL 50 MG PO TABS: 50.0000 mg | ORAL_TABLET | Freq: Four times a day (QID) | ORAL | Status: DC | PRN

## 2015-10-26 NOTE — Assessment & Plan Note (Signed)
Reassurance given.  

## 2015-10-26 NOTE — Assessment & Plan Note (Signed)
Refill Tramadol 

## 2015-10-26 NOTE — Progress Notes (Signed)
BP 137/87 mmHg  Pulse 65  Temp(Src) 98.3 F (36.8 C)  Ht 5' 7.3" (1.709 m)  Wt 169 lb 6.4 oz (76.839 kg)  BMI 26.31 kg/m2  SpO2 99%  LMP 10/17/2015 (Exact Date)   Subjective:    Patient ID: [name], female    DOB: 1971-04-28, 45 y.o.   MRN: [id number]  HPI: Alexandra Le is a 45 y.o. female  Chief Complaint  Patient presents with  . Medication Refill    pt states she needs tramadol refilled  . Abnormal ECG    pt states she has had 2 abnormal EKG's in the past and would like it checked   . Weight Loss    pt states she would like to talk about losing weight    Depression Taking Zoloft but still with a lot of anxiety and stress eating a lot Depression screen [name] 2/9 10/26/2015 02/12/2015 02/12/2015  Decreased Interest 1 2 2   Down, Depressed, Hopeless 1 0 0  PHQ - 2 Score 2 2 2     Weight Gain Frustrated with weight gain. She states she is stress eating.  Doesn't even want to go to the gym  Abnormal EKG Pt was told that she had an abnormal EKG.  This was once last year when at urgent care and 6 months ago before surgery.    Back pain Takes Tramadol on occasion.  A prescription lasts about a year  Relevant past medical, surgical, family and social history reviewed and updated as indicated. Interim medical history since our last visit reviewed.   Allergies and medications reviewed and updated.  Review of Systems  Per HPI unless specifically indicated above     Objective:    BP 137/87 mmHg  Pulse 65  Temp(Src) 98.3 F (36.8 C)  Ht 5' 7.3" (1.709 m)  Wt 169 lb 6.4 oz (76.839 kg)  BMI 26.31 kg/m2  SpO2 99%  LMP 10/17/2015 (Exact Date)  Wt Readings from Last 3 Encounters:  10/26/15 169 lb 6.4 oz (76.839 kg)  04/29/15 162 lb (73.483 kg)  02/12/15 162 lb 12.8 oz (73.846 kg)    Physical Exam  Constitutional: She is oriented to person, place, and time. She appears well-developed and well-nourished. No distress.  HENT:  Head: Normocephalic and atraumatic.   Eyes: Conjunctivae and lids are normal. Right eye exhibits no discharge. Left eye exhibits no discharge. No scleral icterus.  Cardiovascular: Normal rate.   Pulmonary/Chest: Effort normal.  Abdominal: Normal appearance. There is no splenomegaly or hepatomegaly.  Musculoskeletal: Normal range of motion.  Neurological: She is alert and oriented to person, place, and time.  Skin: Skin is intact. No rash noted. No pallor.  Psychiatric: She has a normal mood and affect. Her behavior is normal. Judgment and thought content normal.   Reviewed last EKG and shows IRBBB.  Reviewed with Dr. Wynetta Emery and is a normal variant  Results for orders placed or performed in visit on 10/19/15  Cytology - PAP  Result Value Ref Range   CYTOLOGY - PAP PAP RESULT       Assessment & Plan:   Problem List Items Addressed This Visit      Unprioritized   Depression - Primary   Relevant Medications   sertraline (ZOLOFT) 100 MG tablet   buPROPion (WELLBUTRIN SR) 150 MG 12 hr tablet   Incomplete right bundle branch block    Reassurance given      Lumbago    Refill Tramadol  Relevant Medications   traMADol (ULTRAM) 50 MG tablet   Weight gain    Add Buproprion 150 twice a day          Follow up plan: Return in about 6 months (around 04/26/2016).

## 2015-10-26 NOTE — Assessment & Plan Note (Signed)
Add Buproprion 150 twice a day

## 2015-10-27 LAB — COMPREHENSIVE METABOLIC PANEL
A/G RATIO: 1.7 (ref 1.2–2.2)
ALK PHOS: 75 IU/L (ref 39–117)
ALT: 9 IU/L (ref 0–32)
AST: 15 IU/L (ref 0–40)
Albumin: 4.7 g/dL (ref 3.5–5.5)
BUN/Creatinine Ratio: 18 (ref 9–23)
BUN: 13 mg/dL (ref 6–24)
Bilirubin Total: 0.5 mg/dL (ref 0.0–1.2)
CO2: 26 mmol/L (ref 18–29)
Calcium: 9.7 mg/dL (ref 8.7–10.2)
Chloride: 99 mmol/L (ref 96–106)
Creatinine, Ser: 0.72 mg/dL (ref 0.57–1.00)
GFR calc Af Amer: 116 mL/min/{1.73_m2} (ref >59)
GFR calc non Af Amer: 101 mL/min/{1.73_m2} (ref >59)
GLOBULIN, TOTAL: 2.8 g/dL (ref 1.5–4.5)
Glucose: 92 mg/dL (ref 65–99)
POTASSIUM: 4.5 mmol/L (ref 3.5–5.2)
SODIUM: 140 mmol/L (ref 134–144)
Total Protein: 7.5 g/dL (ref 6.0–8.5)

## 2015-10-27 LAB — VITAMIN D 25 HYDROXY (VIT D DEFICIENCY, FRACTURES): VIT D 25 HYDROXY: 30.2 ng/mL (ref 30.0–100.0)

## 2015-10-27 LAB — TSH: TSH: 1.58 u[IU]/mL (ref 0.450–4.500)

## 2015-12-10 ENCOUNTER — Ambulatory Visit: Payer: BLUE CROSS/BLUE SHIELD | Admitting: Unknown Physician Specialty

## 2015-12-10 ENCOUNTER — Encounter: Payer: Self-pay | Admitting: Unknown Physician Specialty

## 2015-12-10 ENCOUNTER — Ambulatory Visit (INDEPENDENT_AMBULATORY_CARE_PROVIDER_SITE_OTHER): Payer: BLUE CROSS/BLUE SHIELD | Admitting: Unknown Physician Specialty

## 2015-12-10 DIAGNOSIS — F418 Other specified anxiety disorders: Secondary | ICD-10-CM | POA: Diagnosis not present

## 2015-12-10 DIAGNOSIS — F419 Anxiety disorder, unspecified: Principal | ICD-10-CM

## 2015-12-10 DIAGNOSIS — M545 Low back pain: Secondary | ICD-10-CM

## 2015-12-10 DIAGNOSIS — F329 Major depressive disorder, single episode, unspecified: Secondary | ICD-10-CM

## 2015-12-10 DIAGNOSIS — I10 Essential (primary) hypertension: Secondary | ICD-10-CM | POA: Diagnosis not present

## 2015-12-10 DIAGNOSIS — F32A Depression, unspecified: Secondary | ICD-10-CM

## 2015-12-10 MED ORDER — TRAMADOL HCL 50 MG PO TABS: 50.0000 mg | ORAL_TABLET | Freq: Four times a day (QID) | ORAL | 0 refills | Status: DC | PRN

## 2015-12-10 NOTE — Progress Notes (Signed)
BP (!) 141/93 (BP Location: Left Arm, Cuff Size: Normal)   Pulse 67   Temp 97.6 F (36.4 C)   Ht 5' 8.7" (1.745 m)   Wt 168 lb 9.6 oz (76.5 kg)   LMP 12/06/2015 (Exact Date)   SpO2 98%   BMI 25.12 kg/m    Subjective:    Patient ID: Alexandra Le, female    DOB: 1971-03-22, 45 y.o.   MRN: [id number]  HPI: Alexandra Le is a 45 y.o. female  Chief Complaint  Patient presents with  . Follow-up  . Medication Refill    pt states last tramadol rx she was given was distroyed in her car so she never filled it and would like it refilled if possible   Back pain Would like a refill of Tramadol that she takes prn.  Never did get it filled.    Depression/anxiety Pt having a lot of stress with taking custody of her grandchildren and trying to find another place to live.  She does think the Buproprion has been a benefit.    Relevant past medical, surgical, family and social history reviewed and updated as indicated. Interim medical history since our last visit reviewed. Allergies and medications reviewed and updated.  Review of Systems  Per HPI unless specifically indicated above     Objective:    BP (!) 141/93 (BP Location: Left Arm, Cuff Size: Normal)   Pulse 67   Temp 97.6 F (36.4 C)   Ht 5' 8.7" (1.745 m)   Wt 168 lb 9.6 oz (76.5 kg)   LMP 12/06/2015 (Exact Date)   SpO2 98%   BMI 25.12 kg/m   Wt Readings from Last 3 Encounters:  12/10/15 168 lb 9.6 oz (76.5 kg)  10/26/15 169 lb 6.4 oz (76.8 kg)  04/29/15 162 lb (73.5 kg)    Physical Exam  Constitutional: She is oriented to person, place, and time. She appears well-developed and well-nourished. No distress.  HENT:  Head: Normocephalic and atraumatic.  Eyes: Conjunctivae and lids are normal. Right eye exhibits no discharge. Left eye exhibits no discharge. No scleral icterus.  Neck: Normal range of motion. Neck supple. No JVD present. Carotid bruit is not present.  Cardiovascular: Normal rate, regular rhythm and normal  heart sounds.   Pulmonary/Chest: Effort normal and breath sounds normal.  Abdominal: Normal appearance. There is no splenomegaly or hepatomegaly.  Musculoskeletal: Normal range of motion.  Neurological: She is alert and oriented to person, place, and time.  Skin: Skin is warm, dry and intact. No rash noted. No pallor.  Psychiatric: She has a normal mood and affect. Her behavior is normal. Judgment and thought content normal.    Results for orders placed or performed in visit on 10/26/15  Comprehensive metabolic panel  Result Value Ref Range   Glucose 92 65 - 99 mg/dL   BUN 13 6 - 24 mg/dL   Creatinine, Ser 0.72 0.57 - 1.00 mg/dL   GFR calc non Af Amer 101 >59 mL/min/1.73   GFR calc Af Amer 116 >59 mL/min/1.73   BUN/Creatinine Ratio 18 9 - 23   Sodium 140 134 - 144 mmol/L   Potassium 4.5 3.5 - 5.2 mmol/L   Chloride 99 96 - 106 mmol/L   CO2 26 18 - 29 mmol/L   Calcium 9.7 8.7 - 10.2 mg/dL   Total Protein 7.5 6.0 - 8.5 g/dL   Albumin 4.7 3.5 - 5.5 g/dL   Globulin, Total 2.8 1.5 - 4.5 g/dL  Albumin/Globulin Ratio 1.7 1.2 - 2.2   Bilirubin Total 0.5 0.0 - 1.2 mg/dL   Alkaline Phosphatase 75 39 - 117 IU/L   AST 15 0 - 40 IU/L   ALT 9 0 - 32 IU/L  VITAMIN D 25 Hydroxy (Vit-D Deficiency, Fractures)  Result Value Ref Range   Vit D, 25-Hydroxy 30.2 30.0 - 100.0 ng/mL  TSH  Result Value Ref Range   TSH 1.580 0.450 - 4.500 uIU/mL      Assessment & Plan:   Problem List Items Addressed This Visit      Unprioritized   Anxiety and depression    Stable despite many stressors.        Hypertension    Stable, continue present medications.        Lumbago    OK for refill of Tramadol      Relevant Medications   traMADol (ULTRAM) 50 MG tablet    Other Visit Diagnoses   None.      Follow up plan: Return in about 6 months (around 06/11/2016).

## 2015-12-10 NOTE — Assessment & Plan Note (Signed)
OK for refill of Tramadol

## 2015-12-10 NOTE — Assessment & Plan Note (Signed)
Stable, continue present medications.   

## 2015-12-10 NOTE — Assessment & Plan Note (Signed)
Stable despite many stressors.

## 2016-01-19 ENCOUNTER — Other Ambulatory Visit: Payer: Self-pay | Admitting: Unknown Physician Specialty

## 2016-04-25 ENCOUNTER — Other Ambulatory Visit: Payer: Self-pay | Admitting: Family Medicine

## 2016-06-16 ENCOUNTER — Encounter: Payer: Self-pay | Admitting: Unknown Physician Specialty

## 2016-06-16 ENCOUNTER — Ambulatory Visit (INDEPENDENT_AMBULATORY_CARE_PROVIDER_SITE_OTHER): Payer: BLUE CROSS/BLUE SHIELD | Admitting: Unknown Physician Specialty

## 2016-06-16 VITALS — BP 114/77 | HR 78 | Temp 98.6°F | Ht 68.8 in | Wt 171.6 lb

## 2016-06-16 DIAGNOSIS — Z0000 Encounter for general adult medical examination without abnormal findings: Secondary | ICD-10-CM

## 2016-06-16 DIAGNOSIS — Z114 Encounter for screening for human immunodeficiency virus [HIV]: Secondary | ICD-10-CM

## 2016-06-16 DIAGNOSIS — F419 Anxiety disorder, unspecified: Secondary | ICD-10-CM

## 2016-06-16 DIAGNOSIS — F329 Major depressive disorder, single episode, unspecified: Secondary | ICD-10-CM

## 2016-06-16 DIAGNOSIS — I10 Essential (primary) hypertension: Secondary | ICD-10-CM

## 2016-06-16 DIAGNOSIS — Z86010 Personal history of colonic polyps: Secondary | ICD-10-CM | POA: Diagnosis not present

## 2016-06-16 DIAGNOSIS — R197 Diarrhea, unspecified: Secondary | ICD-10-CM

## 2016-06-16 DIAGNOSIS — F32A Depression, unspecified: Secondary | ICD-10-CM

## 2016-06-16 DIAGNOSIS — F418 Other specified anxiety disorders: Secondary | ICD-10-CM | POA: Diagnosis not present

## 2016-06-16 MED ORDER — BUPROPION HCL ER (SR) 100 MG PO TB12: 100.0000 mg | ORAL_TABLET | Freq: Two times a day (BID) | ORAL | 1 refills | Status: DC

## 2016-06-16 MED ORDER — SERTRALINE HCL 50 MG PO TABS: 50.0000 mg | ORAL_TABLET | Freq: Every day | ORAL | 3 refills | Status: DC

## 2016-06-16 NOTE — Assessment & Plan Note (Signed)
Stable, continue present medications.   

## 2016-06-16 NOTE — Progress Notes (Signed)
BP 114/77 (BP Location: Left Arm, Patient Position: Sitting, Cuff Size: Large)   Pulse 78   Temp 98.6 F (37 C)   Ht 5' 8.8" (1.748 m)   Wt 171 lb 9.6 oz (77.8 kg)   LMP 05/24/2016 (Exact Date)   SpO2 98%   BMI 25.49 kg/m    Subjective:    Patient ID: Alexandra Le, female    DOB: [birth year], 46 y.o.   MRN: [id number]  HPI: Alexandra Le is a 46 y.o. female  Chief Complaint  Patient presents with  . Depression  . Hypertension  . Labs Only    pt states she would like labs checked, states she feels like her iron may be low   Depression/anxiety She is doing well on present medication. She is caring for her grandchildren.  Weaning off medications.  Taking 1/2 Zoloft every other day and one Buproprion per day.   Depression screen [facility] 2/9 06/16/2016 10/26/2015 02/12/2015 02/12/2015  Decreased Interest 0 1 2 2   Down, Depressed, Hopeless 0 1 0 0  PHQ - 2 Score 0 2 2 2    Hypertension Using medications without difficulty Average home BPs doing well outside the office  No problems or lightheadedness No chest pain with exertion or shortness of breath No Edema  Pt is having a lot of nausea and diarrhea.  History of tubal ligation.  Pt is wondering if if she should get a colonoscopy due to a history of polyps.  Last time was in 2008.    Relevant past medical, surgical, family and social history reviewed and updated as indicated. Interim medical history since our last visit reviewed. Allergies and medications reviewed and updated.  Review of Systems  Per HPI unless specifically indicated above     Objective:    BP 114/77 (BP Location: Left Arm, Patient Position: Sitting, Cuff Size: Large)   Pulse 78   Temp 98.6 F (37 C)   Ht 5' 8.8" (1.748 m)   Wt 171 lb 9.6 oz (77.8 kg)   LMP 05/24/2016 (Exact Date)   SpO2 98%   BMI 25.49 kg/m   Wt Readings from Last 3 Encounters:  06/16/16 171 lb 9.6 oz (77.8 kg)  12/10/15 168 lb 9.6 oz (76.5 kg)  10/26/15 169 lb 6.4 oz (76.8 kg)      Physical Exam  Constitutional: She is oriented to person, place, and time. She appears well-developed and well-nourished. No distress.  HENT:  Head: Normocephalic and atraumatic.  Eyes: Conjunctivae and lids are normal. Right eye exhibits no discharge. Left eye exhibits no discharge. No scleral icterus.  Neck: Normal range of motion. Neck supple. No JVD present. Carotid bruit is not present.  Cardiovascular: Normal rate, regular rhythm and normal heart sounds.   Pulmonary/Chest: Effort normal and breath sounds normal.  Abdominal: Normal appearance. There is no splenomegaly or hepatomegaly.  Musculoskeletal: Normal range of motion.  Neurological: She is alert and oriented to person, place, and time.  Skin: Skin is warm, dry and intact. No rash noted. No pallor.  Psychiatric: She has a normal mood and affect. Her behavior is normal. Judgment and thought content normal.      Assessment & Plan:   Problem List Items Addressed This Visit      Unprioritized   Anxiety and depression    Continue to taper as tolerated.         Relevant Orders   VITAMIN D 25 Hydroxy (Vit-D Deficiency, Fractures)   Hypertension  Stable, continue present medications.        Relevant Orders   Comprehensive metabolic panel    Other Visit Diagnoses    Health care maintenance    -  Primary   Screening for HIV without presence of risk factors       Relevant Orders   HIV antibody   History of colon polyps       Relevant Orders   Ambulatory referral to Gastroenterology   Diarrhea, unspecified type       Relevant Orders   CBC with Differential/Platelet       Follow up plan: Return in about 6 months (around 12/14/2016).

## 2016-06-16 NOTE — Assessment & Plan Note (Signed)
Continue to taper as tolerated.

## 2016-06-17 LAB — COMPREHENSIVE METABOLIC PANEL
ALBUMIN: 4.2 g/dL (ref 3.5–5.5)
ALK PHOS: 67 IU/L (ref 39–117)
ALT: 11 IU/L (ref 0–32)
AST: 14 IU/L (ref 0–40)
Albumin/Globulin Ratio: 1.5 (ref 1.2–2.2)
BUN/Creatinine Ratio: 18 (ref 9–23)
BUN: 16 mg/dL (ref 6–24)
Bilirubin Total: 0.4 mg/dL (ref 0.0–1.2)
CALCIUM: 9.4 mg/dL (ref 8.7–10.2)
CO2: 21 mmol/L (ref 18–29)
CREATININE: 0.88 mg/dL (ref 0.57–1.00)
Chloride: 97 mmol/L (ref 96–106)
GFR calc Af Amer: 90 mL/min/{1.73_m2} (ref >59)
GFR, EST NON AFRICAN AMERICAN: 78 mL/min/{1.73_m2} (ref >59)
GLUCOSE: 78 mg/dL (ref 65–99)
Globulin, Total: 2.8 g/dL (ref 1.5–4.5)
Potassium: 4.5 mmol/L (ref 3.5–5.2)
Sodium: 139 mmol/L (ref 134–144)
Total Protein: 7 g/dL (ref 6.0–8.5)

## 2016-06-17 LAB — CBC WITH DIFFERENTIAL/PLATELET
BASOS ABS: 0 10*3/uL (ref 0.0–0.2)
Basos: 0 %
EOS (ABSOLUTE): 0.3 10*3/uL (ref 0.0–0.4)
EOS: 3 %
HEMOGLOBIN: 13.5 g/dL (ref 11.1–15.9)
Hematocrit: 40.5 % (ref 34.0–46.6)
IMMATURE GRANULOCYTES: 0 %
Immature Grans (Abs): 0 10*3/uL (ref 0.0–0.1)
LYMPHS ABS: 1.9 10*3/uL (ref 0.7–3.1)
LYMPHS: 26 %
MCH: 30.1 pg (ref 26.6–33.0)
MCHC: 33.3 g/dL (ref 31.5–35.7)
MCV: 90 fL (ref 79–97)
MONOCYTES: 8 %
Monocytes Absolute: 0.6 10*3/uL (ref 0.1–0.9)
NEUTROS PCT: 63 %
Neutrophils Absolute: 4.7 10*3/uL (ref 1.4–7.0)
Platelets: 333 10*3/uL (ref 150–379)
RBC: 4.48 x10E6/uL (ref 3.77–5.28)
RDW: 13 % (ref 12.3–15.4)
WBC: 7.6 10*3/uL (ref 3.4–10.8)

## 2016-06-17 LAB — HIV ANTIBODY (ROUTINE TESTING W REFLEX): HIV Screen 4th Generation wRfx: NONREACTIVE

## 2016-06-17 LAB — VITAMIN D 25 HYDROXY (VIT D DEFICIENCY, FRACTURES): VIT D 25 HYDROXY: 24.2 ng/mL — AB (ref 30.0–100.0)

## 2016-06-22 ENCOUNTER — Other Ambulatory Visit: Payer: Self-pay

## 2016-06-22 ENCOUNTER — Telehealth: Payer: Self-pay

## 2016-06-27 NOTE — Telephone Encounter (Signed)
Gastroenterology Pre-Procedure Review  Request Date:  Requesting Physician: Dr.   PATIENT REVIEW QUESTIONS: The patient responded to the following health history questions as indicated:    1. Are you having any GI issues? no 2. Do you have a personal history of Polyps? yes (yes) 3. Do you have a family history of Colon Cancer or Polyps? Pt adopted 4. Diabetes Mellitus? no 5. Joint replacements in the past 12 months?no 6. Major health problems in the past 3 months?no 7. Any artificial heart valves, MVP, or defibrillator?no    MEDICATIONS & ALLERGIES:    Patient reports the following regarding taking any anticoagulation/antiplatelet therapy:   Plavix, Coumadin, Eliquis, Xarelto, Lovenox, Pradaxa, Brilinta, or Effient? no Aspirin? no  Patient confirms/reports the following medications:  Current Outpatient Prescriptions  Medication Sig Dispense Refill  . benazepril-hydrochlorthiazide (LOTENSIN HCT) 20-25 MG tablet TAKE 1 TABLET DAILY 90 tablet 0  . buPROPion (WELLBUTRIN SR) 100 MG 12 hr tablet Take 1 tablet (100 mg total) by mouth 2 (two) times daily. 30 tablet 1  . sertraline (ZOLOFT) 50 MG tablet Take 1 tablet (50 mg total) by mouth daily. 30 tablet 3  . traMADol (ULTRAM) 50 MG tablet Take 1 tablet (50 mg total) by mouth every 6 (six) hours as needed. 30 tablet 0   No current facility-administered medications for this visit.     Patient confirms/reports the following allergies:  No Known Allergies  No orders of the defined types were placed in this encounter.   AUTHORIZATION INFORMATION Primary Insurance: 1D#: Group #:  Secondary Insurance: 1D#: Group #:  SCHEDULE INFORMATION: Date: 07/21/16 Time: Location: [place]

## 2016-07-13 ENCOUNTER — Other Ambulatory Visit (HOSPITAL_COMMUNITY)
Admission: RE | Admit: 2016-07-13 | Discharge: 2016-07-13 | Disposition: A | Discharge status: Home / Self Care | Payer: BLUE CROSS/BLUE SHIELD | Source: Ambulatory Visit | Attending: Nurse Practitioner | Admitting: Nurse Practitioner

## 2016-07-13 ENCOUNTER — Other Ambulatory Visit: Payer: Self-pay | Admitting: Nurse Practitioner

## 2016-07-13 DIAGNOSIS — Z1151 Encounter for screening for human papillomavirus (HPV): Secondary | ICD-10-CM | POA: Diagnosis not present

## 2016-07-13 DIAGNOSIS — Z01419 Encounter for gynecological examination (general) (routine) without abnormal findings: Secondary | ICD-10-CM | POA: Diagnosis present

## 2016-07-18 ENCOUNTER — Encounter: Payer: Self-pay | Admitting: *Deleted

## 2016-07-18 LAB — CYTOLOGY - PAP
Adequacy: ABSENT
DIAGNOSIS: NEGATIVE
HPV: NOT DETECTED

## 2016-07-20 NOTE — Discharge Instructions (Signed)

## 2016-07-21 ENCOUNTER — Ambulatory Visit: Payer: BLUE CROSS/BLUE SHIELD | Admitting: Anesthesiology

## 2016-07-21 ENCOUNTER — Encounter
Admission: RE | Disposition: A | Discharge status: Home / Self Care | Payer: Self-pay | Source: Ambulatory Visit | Attending: Gastroenterology

## 2016-07-21 ENCOUNTER — Ambulatory Visit
Admission: RE | Admit: 2016-07-21 | Discharge: 2016-07-21 | Disposition: A | Discharge status: Home / Self Care | Payer: BLUE CROSS/BLUE SHIELD | Source: Ambulatory Visit | Attending: Gastroenterology | Admitting: Gastroenterology

## 2016-07-21 DIAGNOSIS — F329 Major depressive disorder, single episode, unspecified: Secondary | ICD-10-CM | POA: Diagnosis not present

## 2016-07-21 DIAGNOSIS — Z1211 Encounter for screening for malignant neoplasm of colon: Secondary | ICD-10-CM

## 2016-07-21 DIAGNOSIS — Z86010 Personal history of colonic polyps: Secondary | ICD-10-CM | POA: Diagnosis not present

## 2016-07-21 DIAGNOSIS — I10 Essential (primary) hypertension: Secondary | ICD-10-CM | POA: Diagnosis not present

## 2016-07-21 DIAGNOSIS — K635 Polyp of colon: Secondary | ICD-10-CM

## 2016-07-21 DIAGNOSIS — K640 First degree hemorrhoids: Secondary | ICD-10-CM | POA: Insufficient documentation

## 2016-07-21 DIAGNOSIS — F419 Anxiety disorder, unspecified: Secondary | ICD-10-CM | POA: Diagnosis not present

## 2016-07-21 DIAGNOSIS — I4510 Unspecified right bundle-branch block: Secondary | ICD-10-CM | POA: Insufficient documentation

## 2016-07-21 DIAGNOSIS — Z79899 Other long term (current) drug therapy: Secondary | ICD-10-CM | POA: Insufficient documentation

## 2016-07-21 DIAGNOSIS — D125 Benign neoplasm of sigmoid colon: Secondary | ICD-10-CM | POA: Insufficient documentation

## 2016-07-21 DIAGNOSIS — D649 Anemia, unspecified: Secondary | ICD-10-CM | POA: Diagnosis not present

## 2016-07-21 HISTORY — PX: BIOPSY: SHX5522

## 2016-07-21 HISTORY — PX: COLONOSCOPY WITH PROPOFOL: SHX5780

## 2016-07-21 SURGERY — COLONOSCOPY WITH PROPOFOL
Anesthesia: Monitor Anesthesia Care | Site: Rectum | Wound class: Contaminated

## 2016-07-21 MED ORDER — STERILE WATER FOR IRRIGATION IR SOLN
Status: DC | PRN
  Administered 2016-07-21: 08:00:00

## 2016-07-21 MED ORDER — LIDOCAINE HCL (CARDIAC) 20 MG/ML IV SOLN
INTRAVENOUS | Status: DC | PRN
Start: 2016-07-21 — End: 2016-07-21
  Administered 2016-07-21: 50 mg via INTRAVENOUS

## 2016-07-21 MED ORDER — LACTATED RINGERS IV SOLN
INTRAVENOUS | Status: DC | PRN
  Administered 2016-07-21: 07:00:00 via INTRAVENOUS

## 2016-07-21 MED ORDER — PROPOFOL 10 MG/ML IV BOLUS
INTRAVENOUS | Status: DC | PRN
  Administered 2016-07-21 (×2): 50 mg via INTRAVENOUS
  Administered 2016-07-21: 100 mg via INTRAVENOUS
  Administered 2016-07-21 (×3): 50 mg via INTRAVENOUS

## 2016-07-21 SURGICAL SUPPLY — 23 items
CANISTER SUCT 1200ML W/VALVE (MISCELLANEOUS) ×2 IMPLANT
CLIP HMST 235XBRD CATH ROT (MISCELLANEOUS) IMPLANT
CLIP RESOLUTION 360 11X235 (MISCELLANEOUS)
FCP ESCP3.2XJMB 240X2.8X (MISCELLANEOUS)
FORCEPS BIOP RAD 4 LRG CAP 4 (CUTTING FORCEPS) IMPLANT
FORCEPS BIOP RJ4 240 W/NDL (MISCELLANEOUS)
FORCEPS ESCP3.2XJMB 240X2.8X (MISCELLANEOUS) IMPLANT
GOWN CVR UNV OPN BCK APRN NK (MISCELLANEOUS) ×2 IMPLANT
GOWN ISOL THUMB LOOP REG UNIV (MISCELLANEOUS) ×2
INJECTOR VARIJECT VIN23 (MISCELLANEOUS) IMPLANT
KIT DEFENDO VALVE AND CONN (KITS) IMPLANT
KIT ENDO PROCEDURE OLY (KITS) ×2 IMPLANT
MARKER SPOT ENDO TATTOO 5ML (MISCELLANEOUS) IMPLANT
PAD GROUND ADULT SPLIT (MISCELLANEOUS) IMPLANT
PROBE APC STR FIRE (PROBE) IMPLANT
RETRIEVER NET ROTH 2.5X230 LF (MISCELLANEOUS) IMPLANT
SNARE SHORT THROW 13M SML OVAL (MISCELLANEOUS) ×2 IMPLANT
SNARE SHORT THROW 30M LRG OVAL (MISCELLANEOUS) IMPLANT
SNARE SNG USE RND 15MM (INSTRUMENTS) IMPLANT
SPOT EX ENDOSCOPIC TATTOO (MISCELLANEOUS)
TRAP ETRAP POLY (MISCELLANEOUS) ×2 IMPLANT
VARIJECT INJECTOR VIN23 (MISCELLANEOUS)
WATER STERILE IRR 250ML POUR (IV SOLUTION) ×2 IMPLANT

## 2016-07-21 NOTE — H&P (Signed)
Alexandra Lame, MD [facility] [address] [place], [place] [address] Phone: [phone] Fax : [phone]  Primary Care Physician:  Alexandra Haddock, NP Primary Gastroenterologist:  Dr. Allen Le  Pre-Procedure History & Physical: HPI:  Alexandra Le is a 46 y.o. female is here for an colonoscopy.   Past Medical History:  Diagnosis Date  . Anemia    history of anemai due to AUB  . Anxiety   . Chest pain of unknown etiology 2015   [place] not dx'd and no follow up  . Depression   . Hypertension   . PONV (postoperative nausea and vomiting)     Past Surgical History:  Procedure Laterality Date  . CERVICAL CONIZATION W/BX N/A 04/30/2015   Procedure: CONIZATION CERVIX WITH BIOPSY;  Surgeon: Alexandra Louis, MD;  Location: [facility];  Service: Gynecology;  Laterality: N/A;  . pyloric stenosis    . TUBAL LIGATION      Prior to Admission medications   Medication Sig Start Date End Date Taking? Authorizing Provider  benazepril-hydrochlorthiazide (LOTENSIN HCT) 20-25 MG tablet TAKE 1 TABLET DAILY 04/26/16  Yes Alexandra Haddock, NP  buPROPion (WELLBUTRIN SR) 100 MG 12 hr tablet Take 1 tablet (100 mg total) by mouth 2 (two) times daily. 06/16/16  Yes Alexandra Haddock, NP  Cholecalciferol (VITAMIN D PO) Take by mouth daily.   Yes Historical Provider, MD  Multiple Vitamin (MULTIVITAMIN) tablet Take 1 tablet by mouth daily.   Yes Historical Provider, MD  sertraline (ZOLOFT) 50 MG tablet Take 1 tablet (50 mg total) by mouth daily. 06/16/16  Yes Alexandra Haddock, NP  traMADol (ULTRAM) 50 MG tablet Take 1 tablet (50 mg total) by mouth every 6 (six) hours as needed. 12/10/15  Yes Alexandra Haddock, NP    Allergies as of 06/22/2016  . (No Known Allergies)    Family History  Problem Relation Age of Onset  . Adopted: Yes  . Breast cancer Neg Hx     Social History   Social History  . Marital status: Widowed    Spouse name: N/A  . Number of children: N/A  . Years of education: N/A    Occupational History  . Not on file.   Social History Main Topics  . Smoking status: Never Smoker  . Smokeless tobacco: Never Used  . Alcohol use 0.6 oz/week    1 Glasses of wine per week     Comment: occasional  . Drug use: No  . Sexual activity: Yes   Other Topics Concern  . Not on file   Social History Narrative  . No narrative on file    Review of Systems: See HPI, otherwise negative ROS  Physical Exam: BP (!) 145/91   Pulse 79   Temp 97.3 F (36.3 C) (Temporal)   Ht 5' 8.8" (1.748 m)   Wt 166 lb (75.3 kg)   LMP 07/14/2016 Comment: Preg test negative  SpO2 100%   BMI 24.66 kg/m  General:   Alert,  pleasant and cooperative in NAD Head:  Normocephalic and atraumatic. Neck:  Supple; no masses or thyromegaly. Lungs:  Clear throughout to auscultation.    Heart:  Regular rate and rhythm. Abdomen:  Soft, nontender and nondistended. Normal bowel sounds, without guarding, and without rebound.   Neurologic:  Alert and  oriented x4;  grossly normal neurologically.  Impression/Plan: Alexandra Le is here for an colonoscopy to be performed for history of colon polyps  Risks, benefits, limitations, and alternatives regarding  colonoscopy have been reviewed with the patient.  Questions have been answered.  All parties agreeable.   Alexandra Lame, MD  07/21/2016, 7:28 AM

## 2016-07-21 NOTE — Op Note (Signed)
[facility] Gastroenterology Patient Name: Alexandra Le Procedure Date: 07/21/2016 7:27 AM MRN: [id number] Account #: [id number] Date of Birth: [birth year] Admit Type: Outpatient Age: 46 Room: [facility] OR ROOM 01 Gender: Female Note Status: Finalized Procedure:            Colonoscopy Indications:          High risk colon cancer surveillance: Personal history                        of colonic polyps Providers:            Lucilla Lame MD, MD Referring MD:         Kathrine Haddock (Referring MD) Medicines:            Propofol per Anesthesia Complications:        No immediate complications. Procedure:            Pre-Anesthesia Assessment:                       - Prior to the procedure, a History and Physical was                        performed, and patient medications and allergies were                        reviewed. The patient's tolerance of previous                        anesthesia was also reviewed. The risks and benefits of                        the procedure and the sedation options and risks were                        discussed with the patient. All questions were                        answered, and informed consent was obtained. Prior                        Anticoagulants: The patient has taken no previous                        anticoagulant or antiplatelet agents. ASA Grade                        Assessment: II - A patient with mild systemic disease.                        After reviewing the risks and benefits, the patient was                        deemed in satisfactory condition to undergo the                        procedure.                       After obtaining informed consent, the colonoscope was  passed under direct vision. Throughout the procedure,                        the patient's blood pressure, pulse, and oxygen                        saturations were monitored continuously. The Olympus CF                        H180AL  Colonoscope (S#: [id number]) was introduced through                        the anus and advanced to the the cecum, identified by                        appendiceal orifice and ileocecal valve. The                        colonoscopy was performed without difficulty. The                        patient tolerated the procedure well. The quality of                        the bowel preparation was excellent. Findings:      The perianal and digital rectal examinations were normal.      Non-bleeding internal hemorrhoids were found during retroflexion. The       hemorrhoids were Grade I (internal hemorrhoids that do not prolapse).      A 3 mm polyp was found in the sigmoid colon. The polyp was sessile. The       polyp was removed with a cold biopsy forceps. Resection and retrieval       were complete.      A 6 mm polyp was found in the sigmoid colon. The polyp was sessile. The       polyp was removed with a cold snare. Resection and retrieval were       complete. Impression:           - Non-bleeding internal hemorrhoids.                       - One 3 mm polyp in the sigmoid colon, removed with a                        cold biopsy forceps. Resected and retrieved.                       - One 6 mm polyp in the sigmoid colon, removed with a                        cold snare. Resected and retrieved. Recommendation:       - Discharge patient to home.                       - Resume previous diet.                       - Continue present medications.                       -  Await pathology results.                       - Repeat colonoscopy in 5 years for surveillance. Procedure Code(s):    --- Professional ---                       [phone], Colonoscopy, flexible; with removal of tumor(s),                        polyp(s), or other lesion(s) by snare technique                       45380, 49, Colonoscopy, flexible; with biopsy, single                        or multiple Diagnosis Code(s):    --- Professional  ---                       Z86.010, Personal history of colonic polyps                       D12.5, Benign neoplasm of sigmoid colon CPT copyright 2016 American Medical Association. All rights reserved. The codes documented in this report are preliminary and upon coder review may  be revised to meet current compliance requirements. Lucilla Lame MD, MD 07/21/2016 7:57:33 AM This report has been signed electronically. Number of Addenda: 0 Note Initiated On: 07/21/2016 7:27 AM Scope Withdrawal Time: 0 hours 8 minutes 40 seconds  Total Procedure Duration: 0 hours 14 minutes 47 seconds       [facility]

## 2016-07-21 NOTE — Transfer of Care (Signed)
Immediate Anesthesia Transfer of Care Note  Patient: Alexandra Le  Procedure(s) Performed: Procedure(s): COLONOSCOPY WITH PROPOFOL (N/A)  Patient Location: PACU  Anesthesia Type: MAC  Level of Consciousness: awake, alert  and patient cooperative  Airway and Oxygen Therapy: Patient Spontanous Breathing and Patient connected to supplemental oxygen  Post-op Assessment: Post-op Vital signs reviewed, Patient's Cardiovascular Status Stable, Respiratory Function Stable, Patent Airway and No signs of Nausea or vomiting  Post-op Vital Signs: Reviewed and stable  Complications: No apparent anesthesia complications

## 2016-07-21 NOTE — Anesthesia Preprocedure Evaluation (Signed)
Anesthesia Evaluation  Patient identified by MRN, date of birth, ID band  Reviewed: NPO status   History of Anesthesia Complications (+) PONV and history of anesthetic complications  Airway Mallampati: II  TM Distance: >3 FB Neck ROM: full    Dental no notable dental hx.    Pulmonary neg pulmonary ROS,    Pulmonary exam normal        Cardiovascular Exercise Tolerance: Good hypertension, Normal cardiovascular exam+ dysrhythmias (Incomplete right bundle branch block)      Neuro/Psych Anxiety Depression negative neurological ROS  negative psych ROS   GI/Hepatic negative GI ROS, Neg liver ROS,   Endo/Other  negative endocrine ROS  Renal/GU negative Renal ROS  negative genitourinary   Musculoskeletal LBPain   Abdominal   Peds  Hematology negative hematology ROS (+)   Anesthesia Other Findings   Reproductive/Obstetrics negative OB ROS                             Anesthesia Physical Anesthesia Plan  ASA: II  Anesthesia Plan: MAC   Post-op Pain Management:    Induction:   Airway Management Planned:   Additional Equipment:   Intra-op Plan:   Post-operative Plan:   Informed Consent: I have reviewed the patients History and Physical, chart, labs and discussed the procedure including the risks, benefits and alternatives for the proposed anesthesia with the patient or authorized representative who has indicated his/her understanding and acceptance.     Plan Discussed with: CRNA  Anesthesia Plan Comments:         Anesthesia Quick Evaluation

## 2016-07-21 NOTE — Anesthesia Postprocedure Evaluation (Signed)
Anesthesia Post Note  Patient: Alexandra Le  Procedure(s) Performed: Procedure(s) (LRB): COLONOSCOPY WITH PROPOFOL (N/A) BIOPSY (N/A)  Patient location during evaluation: PACU Anesthesia Type: MAC Level of consciousness: awake and alert Pain management: pain level controlled Vital Signs Assessment: post-procedure vital signs reviewed and stable Respiratory status: spontaneous breathing, nonlabored ventilation, respiratory function stable and patient connected to nasal cannula oxygen Cardiovascular status: stable and blood pressure returned to baseline Anesthetic complications: no    Yassmin Binegar

## 2016-07-21 NOTE — Anesthesia Procedure Notes (Signed)
Procedure Name: MAC Performed by: Paislea Hatton Pre-anesthesia Checklist: Patient identified, Emergency Drugs available, Suction available, Timeout performed and Patient being monitored Patient Re-evaluated:Patient Re-evaluated prior to inductionOxygen Delivery Method: Nasal cannula Placement Confirmation: positive ETCO2     

## 2016-07-24 ENCOUNTER — Encounter: Payer: Self-pay | Admitting: Gastroenterology

## 2016-07-25 ENCOUNTER — Encounter: Payer: Self-pay | Admitting: Gastroenterology

## 2016-08-29 ENCOUNTER — Other Ambulatory Visit: Payer: Self-pay | Admitting: Unknown Physician Specialty

## 2016-12-11 ENCOUNTER — Other Ambulatory Visit: Payer: Self-pay | Admitting: Unknown Physician Specialty

## 2017-04-03 ENCOUNTER — Telehealth: Payer: Self-pay | Admitting: Unknown Physician Specialty

## 2017-04-03 ENCOUNTER — Other Ambulatory Visit: Payer: Self-pay

## 2017-04-03 MED ORDER — BENAZEPRIL-HYDROCHLOROTHIAZIDE 20-25 MG PO TABS: 1.0000 | ORAL_TABLET | Freq: Every day | ORAL | 0 refills | Status: DC

## 2017-04-03 NOTE — Telephone Encounter (Signed)
Copied from [place]. Topic: Quick Communication - See Telephone Encounter >> Apr 03, 2017  1:23 PM Hewitt Shorts wrote: CRM for notification. See Telephone encounter for: 04/03/17. Pt is needing a refill on her blood pressure medication she did not know the name of meds -she states that she only takes one type of blood pressure meds  Best number [phone]  [facility] [place]

## 2017-04-03 NOTE — Telephone Encounter (Signed)
Refilled Lotensin x 1 month only. Pt. Needs to schedule an appointment.

## 2017-04-11 ENCOUNTER — Other Ambulatory Visit: Payer: Self-pay | Admitting: Nurse Practitioner

## 2017-04-11 DIAGNOSIS — Z1231 Encounter for screening mammogram for malignant neoplasm of breast: Secondary | ICD-10-CM

## 2017-04-17 ENCOUNTER — Ambulatory Visit
Admission: RE | Admit: 2017-04-17 | Discharge: 2017-04-17 | Disposition: A | Discharge status: Home / Self Care | Payer: BLUE CROSS/BLUE SHIELD | Source: Ambulatory Visit | Attending: Nurse Practitioner | Admitting: Nurse Practitioner

## 2017-04-17 ENCOUNTER — Ambulatory Visit: Payer: BLUE CROSS/BLUE SHIELD

## 2017-04-17 DIAGNOSIS — Z1231 Encounter for screening mammogram for malignant neoplasm of breast: Secondary | ICD-10-CM

## 2017-04-30 ENCOUNTER — Other Ambulatory Visit: Payer: Self-pay | Admitting: Unknown Physician Specialty

## 2017-05-09 ENCOUNTER — Ambulatory Visit: Payer: BC Managed Care – PPO

## 2017-05-09 ENCOUNTER — Telehealth: Payer: Self-pay | Admitting: Unknown Physician Specialty

## 2017-05-10 NOTE — Telephone Encounter (Signed)
Left VM for patient to call to schedule appointment with Kathrine Haddock

## 2017-05-10 NOTE — Telephone Encounter (Signed)
Pt needs seen.

## 2017-05-14 NOTE — Telephone Encounter (Signed)
Copied from [place] [id number]. Topic: General - Other >> May 14, 2017  4:35 PM Alexandra Le wrote: Pt is needing Benazepril HCTZ 20-25mg  refilled.  She has an appt w/ Dr. Julian Hy on Wed. 05-16-17  [facility]  [address] -  [phone]

## 2017-05-14 NOTE — Telephone Encounter (Signed)
Left message for pt advising pt that mediation refill was sent on 1/3 to the requested pharmacy and to contact pharmacy to see if refill is ready for pickup.

## 2017-05-16 ENCOUNTER — Encounter: Payer: Self-pay | Admitting: Unknown Physician Specialty

## 2017-05-16 ENCOUNTER — Ambulatory Visit (INDEPENDENT_AMBULATORY_CARE_PROVIDER_SITE_OTHER): Payer: BLUE CROSS/BLUE SHIELD | Admitting: Unknown Physician Specialty

## 2017-05-16 DIAGNOSIS — F332 Major depressive disorder, recurrent severe without psychotic features: Secondary | ICD-10-CM

## 2017-05-16 DIAGNOSIS — I10 Essential (primary) hypertension: Secondary | ICD-10-CM | POA: Diagnosis not present

## 2017-05-16 MED ORDER — VENLAFAXINE HCL ER 75 MG PO CP24
75.0000 mg | ORAL_CAPSULE | Freq: Every day | ORAL | 1 refills | Status: DC
Start: 2017-05-16 — End: 2017-06-20

## 2017-05-16 MED ORDER — VENLAFAXINE HCL ER 75 MG PO CP24: 75.0000 mg | ORAL_CAPSULE | Freq: Every day | ORAL | 1 refills | Status: DC

## 2017-05-16 MED ORDER — BENAZEPRIL-HYDROCHLOROTHIAZIDE 20-25 MG PO TABS: 1.0000 | ORAL_TABLET | Freq: Every day | ORAL | 1 refills | Status: DC

## 2017-05-16 NOTE — Assessment & Plan Note (Addendum)
Not controlled.  Switch from Sertraline to Venlafaxine XR 75 mg daily.  Recommended counseling which pt will initiate through work

## 2017-05-16 NOTE — Assessment & Plan Note (Addendum)
Borderline high. Out of meds over the weekend.   Continue present medications.

## 2017-05-16 NOTE — Progress Notes (Signed)
BP 138/88   Pulse 75   Temp 98.8 F (37.1 C) (Oral)   Ht 5' 8.2" (1.732 m)   Wt 180 lb 14.4 oz (82.1 kg)   LMP 05/15/2017   SpO2 98%   BMI 27.34 kg/m    Subjective:    Patient ID: Alexandra Le, female    DOB: [birth year], 47 y.o.   MRN: [id number]  HPI: Alexandra Le is a 47 y.o. female  Chief Complaint  Patient presents with  . Depression  . Hypertension   Depression Last visit she was tapering her depression medication.  She has a lot going on.  Working a lot.  Has restarted her medication.  Currently taking Buproprion 150 mg daily and Sertraline 100 mg.       Depression screen [facility] 2/9 05/16/2017 06/16/2016 10/26/2015 02/12/2015 02/12/2015  Decreased Interest 1 0 1 2 2   Down, Depressed, Hopeless 1 0 1 0 0  PHQ - 2 Score 2 0 2 2 2   Altered sleeping 3 - - - -  Tired, decreased energy 3 - - - -  Change in appetite 3 - - - -  Feeling bad or failure about yourself  1 - - - -  Trouble concentrating 2 - - - -  Moving slowly or fidgety/restless 2 - - - -  Suicidal thoughts 0 - - - -  PHQ-9 Score 16 - - - -   Hypertension Using medications without difficulty Average home BPs "goes up and down"   No problems or lightheadedness No chest pain with exertion or shortness of breath No Edema   Relevant past medical, surgical, family and social history reviewed and updated as indicated. Interim medical history since our last visit reviewed. Allergies and medications reviewed and updated.  Review of Systems  Constitutional: Negative.   HENT: Negative.   Eyes: Negative.   Respiratory: Negative.   Cardiovascular: Negative.   Gastrointestinal: Negative.   Endocrine: Negative.   Genitourinary: Negative.   Musculoskeletal: Negative.   Skin: Negative.   Allergic/Immunologic: Negative.   Neurological: Negative.   Hematological: Negative.   Psychiatric/Behavioral: Negative.     Per HPI unless specifically indicated above     Objective:    BP 138/88   Pulse  75   Temp 98.8 F (37.1 C) (Oral)   Ht 5' 8.2" (1.732 m)   Wt 180 lb 14.4 oz (82.1 kg)   LMP 05/15/2017   SpO2 98%   BMI 27.34 kg/m   Wt Readings from Last 3 Encounters:  05/16/17 180 lb 14.4 oz (82.1 kg)  07/21/16 166 lb (75.3 kg)  06/16/16 171 lb 9.6 oz (77.8 kg)    Physical Exam  Constitutional: She is oriented to person, place, and time. She appears well-developed and well-nourished. No distress.  HENT:  Head: Normocephalic and atraumatic.  Eyes: Conjunctivae and lids are normal. Right eye exhibits no discharge. Left eye exhibits no discharge. No scleral icterus.  Neck: Normal range of motion. Neck supple. No JVD present. Carotid bruit is not present.  Cardiovascular: Normal rate, regular rhythm and normal heart sounds.  Pulmonary/Chest: Effort normal and breath sounds normal.  Abdominal: Normal appearance. There is no splenomegaly or hepatomegaly.  Musculoskeletal: Normal range of motion.  Neurological: She is alert and oriented to person, place, and time.  Skin: Skin is warm, dry and intact. No rash noted. No pallor.  Psychiatric: She has a normal mood and affect. Her behavior is normal. Judgment and thought content normal.  Results for orders placed or performed in visit on 07/13/16  Cytology - PAP  Result Value Ref Range   Adequacy      Satisfactory for evaluation  endocervical/transformation zone component ABSENT.   Diagnosis      NEGATIVE FOR INTRAEPITHELIAL LESIONS OR MALIGNANCY.   HPV NOT DETECTED    Material Submitted CervicoVaginal Pap [ThinPrep Imaged]    CYTOLOGY - PAP PAP RESULT       Assessment & Plan:   Problem List Items Addressed This Visit      Unprioritized   Depression    Not controlled.  Switch from Sertraline to Venlafaxine XR 75 mg daily.  Recommended counseling which pt will initiate through work      Relevant Medications   venlafaxine XR (EFFEXOR-XR) 75 MG 24 hr capsule   Hypertension    Borderline high. Out of meds over the weekend.    Continue present medications.        Relevant Medications   benazepril-hydrochlorthiazide (LOTENSIN HCT) 20-25 MG tablet       Follow up plan: Return in about 4 weeks (around 06/13/2017).

## 2017-06-10 ENCOUNTER — Other Ambulatory Visit: Payer: Self-pay | Admitting: Unknown Physician Specialty

## 2017-06-14 ENCOUNTER — Other Ambulatory Visit: Payer: Self-pay | Admitting: Unknown Physician Specialty

## 2017-06-20 ENCOUNTER — Encounter: Payer: Self-pay | Admitting: Unknown Physician Specialty

## 2017-06-20 ENCOUNTER — Ambulatory Visit (INDEPENDENT_AMBULATORY_CARE_PROVIDER_SITE_OTHER): Payer: BLUE CROSS/BLUE SHIELD | Admitting: Unknown Physician Specialty

## 2017-06-20 DIAGNOSIS — F419 Anxiety disorder, unspecified: Secondary | ICD-10-CM | POA: Diagnosis not present

## 2017-06-20 DIAGNOSIS — I10 Essential (primary) hypertension: Secondary | ICD-10-CM

## 2017-06-20 DIAGNOSIS — F329 Major depressive disorder, single episode, unspecified: Secondary | ICD-10-CM | POA: Diagnosis not present

## 2017-06-20 DIAGNOSIS — M545 Low back pain: Secondary | ICD-10-CM | POA: Diagnosis not present

## 2017-06-20 DIAGNOSIS — F32A Depression, unspecified: Secondary | ICD-10-CM

## 2017-06-20 MED ORDER — BUPROPION HCL ER (SR) 150 MG PO TB12: 150.0000 mg | ORAL_TABLET | Freq: Two times a day (BID) | ORAL | 3 refills | Status: DC

## 2017-06-20 MED ORDER — BENAZEPRIL-HYDROCHLOROTHIAZIDE 20-25 MG PO TABS: 1.0000 | ORAL_TABLET | Freq: Every day | ORAL | 1 refills | Status: DC

## 2017-06-20 MED ORDER — TRAMADOL HCL 50 MG PO TABS: 50.0000 mg | ORAL_TABLET | Freq: Four times a day (QID) | ORAL | 0 refills | Status: DC | PRN

## 2017-06-20 MED ORDER — VENLAFAXINE HCL ER 75 MG PO CP24: 75.0000 mg | ORAL_CAPSULE | Freq: Every day | ORAL | 12 refills | Status: DC

## 2017-06-20 NOTE — Assessment & Plan Note (Signed)
OK for occasional Tramadol use.  Discussed yoga for back pain

## 2017-06-20 NOTE — Assessment & Plan Note (Signed)
Good control considering off of cholesterol medications.  Continue present treatment

## 2017-06-20 NOTE — Assessment & Plan Note (Signed)
Stable, continue present medications.   

## 2017-06-20 NOTE — Progress Notes (Signed)
BP 137/89 (BP Location: Left Arm, Patient Position: Sitting, Cuff Size: Normal)   Pulse 89   Temp 98.5 F (36.9 C) (Oral)   Ht 5\' 8"  (1.727 m)   Wt 181 lb 9.6 oz (82.4 kg)   SpO2 98%   BMI 27.61 kg/m    Subjective:    Patient ID: Alexandra Le, female    DOB: [birth year], 47 y.o.   MRN: [id number]  HPI: Alexandra Le is a 47 y.o. female  Chief Complaint  Patient presents with  . Follow-up  . Depression  . Hypertension  . Back Pain   Hypertension Using medications without difficulty except out of medications for 7 days.   Average home BPs below 130/80   No problems or lightheadedness No chest pain with exertion or shortness of breath No Edema  Depression States she is doing better once started Venlafaxine.  States she can feel a difference.  States she feels her appetite is variable though gaining weight.   Depression screen [facility] 2/9 06/20/2017 05/16/2017 06/16/2016 10/26/2015 02/12/2015  Decreased Interest 0 1 0 1 2  Down, Depressed, Hopeless 0 1 0 1 0  PHQ - 2 Score 0 2 0 2 2  Altered sleeping 1 3 - - -  Tired, decreased energy 1 3 - - -  Change in appetite 2 3 - - -  Feeling bad or failure about yourself  0 1 - - -  Trouble concentrating 0 2 - - -  Moving slowly or fidgety/restless 0 2 - - -  Suicidal thoughts 0 0 - - -  PHQ-9 Score 4 16 - - -   Lumbago Back pain comes and goes.  In general getting better.  She would like #30 Tramadol that lasts at least a year.    Relevant past medical, surgical, family and social history reviewed and updated as indicated. Interim medical history since our last visit reviewed. Allergies and medications reviewed and updated.  Review of Systems  Per HPI unless specifically indicated above     Objective:    BP 137/89 (BP Location: Left Arm, Patient Position: Sitting, Cuff Size: Normal)   Pulse 89   Temp 98.5 F (36.9 C) (Oral)   Ht 5\' 8"  (1.727 m)   Wt 181 lb 9.6 oz (82.4 kg)   SpO2 98%   BMI 27.61 kg/m     Wt Readings from Last 3 Encounters:  06/20/17 181 lb 9.6 oz (82.4 kg)  05/16/17 180 lb 14.4 oz (82.1 kg)  07/21/16 166 lb (75.3 kg)    Physical Exam  Constitutional: She is oriented to person, place, and time. She appears well-developed and well-nourished. No distress.  HENT:  Head: Normocephalic and atraumatic.  Eyes: Conjunctivae and lids are normal. Right eye exhibits no discharge. Left eye exhibits no discharge. No scleral icterus.  Neck: Normal range of motion. Neck supple. No JVD present. Carotid bruit is not present.  Cardiovascular: Normal rate, regular rhythm and normal heart sounds.  Pulmonary/Chest: Effort normal and breath sounds normal.  Abdominal: Normal appearance. There is no splenomegaly or hepatomegaly.  Musculoskeletal: Normal range of motion.  Neurological: She is alert and oriented to person, place, and time.  Skin: Skin is warm, dry and intact. No rash noted. No pallor.  Psychiatric: She has a normal mood and affect. Her behavior is normal. Judgment and thought content normal.       Assessment & Plan:   Problem List Items Addressed This Visit  Unprioritized   Anxiety and depression    Stable, continue present medications.        Relevant Medications   venlafaxine XR (EFFEXOR-XR) 75 MG 24 hr capsule   buPROPion (WELLBUTRIN SR) 150 MG 12 hr tablet   Other Relevant Orders   TSH   Hypertension    Good control considering off of cholesterol medications.  Continue present treatment      Relevant Medications   benazepril-hydrochlorthiazide (LOTENSIN HCT) 20-25 MG tablet   Other Relevant Orders   CBC with Differential/Platelet   Comprehensive metabolic panel   Lipid Panel w/o Chol/HDL Ratio   Lumbago    OK for occasional Tramadol use.  Discussed yoga for back pain      Relevant Medications   traMADol (ULTRAM) 50 MG tablet       Follow up plan: Return in about 6 months (around 12/18/2017), or if symptoms worsen or fail to improve.

## 2017-06-21 LAB — CBC WITH DIFFERENTIAL/PLATELET
BASOS ABS: 0 10*3/uL (ref 0.0–0.2)
BASOS: 0 %
EOS (ABSOLUTE): 0.2 10*3/uL (ref 0.0–0.4)
Eos: 2 %
Hematocrit: 38.7 % (ref 34.0–46.6)
Hemoglobin: 12.7 g/dL (ref 11.1–15.9)
Immature Grans (Abs): 0 10*3/uL (ref 0.0–0.1)
Immature Granulocytes: 0 %
Lymphocytes Absolute: 1.5 10*3/uL (ref 0.7–3.1)
Lymphs: 21 %
MCH: 30.2 pg (ref 26.6–33.0)
MCHC: 32.8 g/dL (ref 31.5–35.7)
MCV: 92 fL (ref 79–97)
MONOS ABS: 0.4 10*3/uL (ref 0.1–0.9)
Monocytes: 5 %
NEUTROS ABS: 5.3 10*3/uL (ref 1.4–7.0)
Neutrophils: 72 %
PLATELETS: 342 10*3/uL (ref 150–379)
RBC: 4.21 x10E6/uL (ref 3.77–5.28)
RDW: 13.3 % (ref 12.3–15.4)
WBC: 7.4 10*3/uL (ref 3.4–10.8)

## 2017-06-21 LAB — COMPREHENSIVE METABOLIC PANEL
A/G RATIO: 1.7 (ref 1.2–2.2)
ALK PHOS: 85 IU/L (ref 39–117)
ALT: 16 IU/L (ref 0–32)
AST: 17 IU/L (ref 0–40)
Albumin: 4.4 g/dL (ref 3.5–5.5)
BILIRUBIN TOTAL: 0.2 mg/dL (ref 0.0–1.2)
BUN/Creatinine Ratio: 10 (ref 9–23)
BUN: 8 mg/dL (ref 6–24)
CHLORIDE: 104 mmol/L (ref 96–106)
CO2: 23 mmol/L (ref 20–29)
Calcium: 9.5 mg/dL (ref 8.7–10.2)
Creatinine, Ser: 0.82 mg/dL (ref 0.57–1.00)
GFR calc non Af Amer: 85 mL/min/{1.73_m2} (ref >59)
GFR, EST AFRICAN AMERICAN: 98 mL/min/{1.73_m2} (ref >59)
GLUCOSE: 81 mg/dL (ref 65–99)
Globulin, Total: 2.6 g/dL (ref 1.5–4.5)
POTASSIUM: 4.7 mmol/L (ref 3.5–5.2)
Sodium: 143 mmol/L (ref 134–144)
Total Protein: 7 g/dL (ref 6.0–8.5)

## 2017-06-21 LAB — LIPID PANEL W/O CHOL/HDL RATIO
Cholesterol, Total: 178 mg/dL (ref 100–199)
HDL: 74 mg/dL (ref >39)
LDL Calculated: 78 mg/dL (ref 0–99)
TRIGLYCERIDES: 132 mg/dL (ref 0–149)
VLDL CHOLESTEROL CAL: 26 mg/dL (ref 5–40)

## 2017-06-21 LAB — TSH: TSH: 1.53 u[IU]/mL (ref 0.450–4.500)

## 2017-06-21 NOTE — Progress Notes (Signed)
Notified pt by mychart

## 2017-07-10 DIAGNOSIS — L570 Actinic keratosis: Secondary | ICD-10-CM | POA: Diagnosis not present

## 2017-07-10 DIAGNOSIS — D2271 Melanocytic nevi of right lower limb, including hip: Secondary | ICD-10-CM | POA: Diagnosis not present

## 2017-07-10 DIAGNOSIS — D225 Melanocytic nevi of trunk: Secondary | ICD-10-CM | POA: Diagnosis not present

## 2017-07-10 DIAGNOSIS — L821 Other seborrheic keratosis: Secondary | ICD-10-CM | POA: Diagnosis not present

## 2017-07-10 DIAGNOSIS — D2261 Melanocytic nevi of right upper limb, including shoulder: Secondary | ICD-10-CM | POA: Diagnosis not present

## 2017-07-20 ENCOUNTER — Other Ambulatory Visit: Payer: Self-pay | Admitting: Unknown Physician Specialty

## 2017-08-04 DIAGNOSIS — H1089 Other conjunctivitis: Secondary | ICD-10-CM | POA: Diagnosis not present

## 2017-08-04 DIAGNOSIS — W5501XA Bitten by cat, initial encounter: Secondary | ICD-10-CM | POA: Diagnosis not present

## 2017-08-04 DIAGNOSIS — L03818 Cellulitis of other sites: Secondary | ICD-10-CM | POA: Diagnosis not present

## 2017-09-28 ENCOUNTER — Other Ambulatory Visit: Payer: Self-pay | Admitting: Unknown Physician Specialty

## 2017-10-02 NOTE — Telephone Encounter (Signed)
Benazepril-HCTZ refill Last OV: 06/20/17 Last Refill:06/20/17 #90 tab 1 RF [facility] [address]  PCP: Kathrine Haddock NP

## 2017-10-03 ENCOUNTER — Other Ambulatory Visit: Payer: Self-pay | Admitting: Unknown Physician Specialty

## 2017-10-04 ENCOUNTER — Other Ambulatory Visit: Payer: Self-pay | Admitting: Unknown Physician Specialty

## 2017-10-04 NOTE — Telephone Encounter (Signed)
sent 

## 2017-10-05 MED ORDER — BENAZEPRIL-HYDROCHLOROTHIAZIDE 20-25 MG PO TABS: 1.0000 | ORAL_TABLET | Freq: Every day | ORAL | 1 refills | Status: DC

## 2017-10-23 ENCOUNTER — Other Ambulatory Visit: Payer: Self-pay | Admitting: Unknown Physician Specialty

## 2017-10-25 MED ORDER — BENAZEPRIL-HYDROCHLOROTHIAZIDE 20-25 MG PO TABS: 1.0000 | ORAL_TABLET | Freq: Every day | ORAL | 1 refills | Status: DC

## 2017-10-25 NOTE — Telephone Encounter (Signed)
venlafaxine refill Last Refill:06/20/17 # 30 12 RF Last OV: 06/20/17 PCP: Kathrine Haddock NP Pharmacy:[facility]  Benazepril-HCTZ refill Last Refill:10/05/17 # 90 tab 1 RF Last OV: 06/20/17 [facility]

## 2017-11-01 DIAGNOSIS — R300 Dysuria: Secondary | ICD-10-CM | POA: Diagnosis not present

## 2017-11-01 DIAGNOSIS — N898 Other specified noninflammatory disorders of vagina: Secondary | ICD-10-CM | POA: Diagnosis not present

## 2017-11-01 DIAGNOSIS — Z01419 Encounter for gynecological examination (general) (routine) without abnormal findings: Secondary | ICD-10-CM | POA: Diagnosis not present

## 2017-11-01 DIAGNOSIS — N926 Irregular menstruation, unspecified: Secondary | ICD-10-CM | POA: Diagnosis not present

## 2017-11-15 DIAGNOSIS — N926 Irregular menstruation, unspecified: Secondary | ICD-10-CM | POA: Diagnosis not present

## 2017-11-15 DIAGNOSIS — R300 Dysuria: Secondary | ICD-10-CM | POA: Diagnosis not present

## 2017-11-30 ENCOUNTER — Other Ambulatory Visit: Payer: Self-pay | Admitting: Unknown Physician Specialty

## 2017-12-19 ENCOUNTER — Other Ambulatory Visit: Payer: Self-pay

## 2017-12-19 ENCOUNTER — Ambulatory Visit (INDEPENDENT_AMBULATORY_CARE_PROVIDER_SITE_OTHER): Payer: BLUE CROSS/BLUE SHIELD | Admitting: Physician Assistant

## 2017-12-19 ENCOUNTER — Encounter: Payer: BLUE CROSS/BLUE SHIELD | Admitting: Unknown Physician Specialty

## 2017-12-19 ENCOUNTER — Encounter: Payer: Self-pay | Admitting: Physician Assistant

## 2017-12-19 VITALS — BP 137/85 | HR 79 | Temp 98.5°F | Ht 67.5 in | Wt 180.0 lb

## 2017-12-19 DIAGNOSIS — Z0000 Encounter for general adult medical examination without abnormal findings: Secondary | ICD-10-CM | POA: Diagnosis not present

## 2017-12-19 DIAGNOSIS — F419 Anxiety disorder, unspecified: Secondary | ICD-10-CM

## 2017-12-19 DIAGNOSIS — F329 Major depressive disorder, single episode, unspecified: Secondary | ICD-10-CM | POA: Diagnosis not present

## 2017-12-19 DIAGNOSIS — I10 Essential (primary) hypertension: Secondary | ICD-10-CM | POA: Diagnosis not present

## 2017-12-19 DIAGNOSIS — F32A Depression, unspecified: Secondary | ICD-10-CM

## 2017-12-19 MED ORDER — BENAZEPRIL-HYDROCHLOROTHIAZIDE 20-25 MG PO TABS: 1.0000 | ORAL_TABLET | Freq: Every day | ORAL | 0 refills | Status: DC

## 2017-12-19 MED ORDER — VENLAFAXINE HCL ER 75 MG PO CP24: 75.0000 mg | ORAL_CAPSULE | Freq: Every day | ORAL | 0 refills | Status: DC

## 2017-12-19 NOTE — Patient Instructions (Signed)

## 2017-12-19 NOTE — Progress Notes (Signed)
Subjective:    Patient ID: Alexandra Le, female    DOB: [birth year], 47 y.o.   MRN: [id number]  Cockrell Hill is a 47 y.o. female presenting on 12/19/2017 for Annual Exam and URI (pt states she has had congestion, pressure and a cough for the past 3 days)   HPI  Works as a Soil scientist.   PAP: 2019 at [facility], pelvic ultrasound to look for "internal polyps." Returning in October.  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  Depression and Anxiety: Wellbutrin 150 mg BID and Effexor XR 75 mg daily, tolerating well, pleased with outcome of effexor.   Social History   Tobacco Use  . Smoking status: Never Smoker  . Smokeless tobacco: Never Used  Substance Use Topics  . Alcohol use: Yes    Alcohol/week: 1.0 standard drinks    Types: 1 Glasses of wine per week    Comment: occasional  . Drug use: No    Review of Systems Per HPI unless specifically indicated above     Objective:    BP 137/85   Pulse 79   Temp 98.5 F (36.9 C) (Oral)   Ht 5' 7.5" (1.715 m)   Wt 180 lb (81.6 kg)   SpO2 99%   BMI 27.78 kg/m   Wt Readings from Last 3 Encounters:  12/19/17 180 lb (81.6 kg)  06/20/17 181 lb 9.6 oz (82.4 kg)  05/16/17 180 lb 14.4 oz (82.1 kg)    Physical Exam  Constitutional: She is oriented to person, place, and time. She appears well-developed and well-nourished.  HENT:  Right Ear: External ear normal.  Left Ear: External ear normal.  Mouth/Throat: Oropharynx is clear and moist. No oropharyngeal exudate.  Eyes: Conjunctivae are normal. Right eye exhibits no discharge. Left eye exhibits no discharge.  Cardiovascular: Normal rate and regular rhythm.  Pulmonary/Chest: Effort normal and breath sounds normal.  Abdominal: Soft. Bowel sounds are normal.  Neurological: She is alert and oriented to person,  place, and time.  Skin: Skin is warm and dry.  Psychiatric: She has a normal mood and affect. Her behavior is normal.   Results for orders placed or performed in visit on 06/20/17  CBC with Differential/Platelet  Result Value Ref Range   WBC 7.4 3.4 - 10.8 x10E3/uL   RBC 4.21 3.77 - 5.28 x10E6/uL   Hemoglobin 12.7 11.1 - 15.9 g/dL   Hematocrit 38.7 34.0 - 46.6 %   MCV 92 79 - 97 fL   MCH 30.2 26.6 - 33.0 pg   MCHC 32.8 31.5 - 35.7 g/dL   RDW 13.3 12.3 - 15.4 %   Platelets 342 150 - 379 x10E3/uL   Neutrophils 72 Not Estab. %   Lymphs 21 Not Estab. %   Monocytes 5 Not Estab. %   Eos 2 Not Estab. %   Basos 0 Not Estab. %   Neutrophils Absolute 5.3 1.4 - 7.0 x10E3/uL   Lymphocytes Absolute 1.5 0.7 - 3.1 x10E3/uL   Monocytes Absolute 0.4 0.1 - 0.9 x10E3/uL   EOS (ABSOLUTE) 0.2 0.0 - 0.4 x10E3/uL   Basophils Absolute 0.0 0.0 - 0.2 x10E3/uL   Immature Granulocytes 0 Not Estab. %   Immature Grans (Abs) 0.0 0.0 - 0.1 x10E3/uL  Comprehensive metabolic panel  Result Value Ref Range   Glucose 81 65 - 99 mg/dL   BUN  8 6 - 24 mg/dL   Creatinine, Ser 0.82 0.57 - 1.00 mg/dL   GFR calc non Af Amer 85 >59 mL/min/1.73   GFR calc Af Amer 98 >59 mL/min/1.73   BUN/Creatinine Ratio 10 9 - 23   Sodium 143 134 - 144 mmol/L   Potassium 4.7 3.5 - 5.2 mmol/L   Chloride 104 96 - 106 mmol/L   CO2 23 20 - 29 mmol/L   Calcium 9.5 8.7 - 10.2 mg/dL   Total Protein 7.0 6.0 - 8.5 g/dL   Albumin 4.4 3.5 - 5.5 g/dL   Globulin, Total 2.6 1.5 - 4.5 g/dL   Albumin/Globulin Ratio 1.7 1.2 - 2.2   Bilirubin Total 0.2 0.0 - 1.2 mg/dL   Alkaline Phosphatase 85 39 - 117 IU/L   AST 17 0 - 40 IU/L   ALT 16 0 - 32 IU/L  Lipid Panel w/o Chol/HDL Ratio  Result Value Ref Range   Cholesterol, Total 178 100 - 199 mg/dL   Triglycerides 132 0 - 149 mg/dL   HDL 74 >39 mg/dL   VLDL Cholesterol Cal 26 5 - 40 mg/dL   LDL Calculated 78 0 - 99 mg/dL  TSH  Result Value Ref Range   TSH 1.530 0.450 - 4.500 uIU/mL        Assessment & Plan:  1. Annual physical exam   2. Anxiety and depression  Continue medications.  - venlafaxine XR (EFFEXOR-XR) 75 MG 24 hr capsule; Take 1 capsule (75 mg total) by mouth daily with breakfast.  Dispense: 90 capsule; Refill: 0  3. Essential hypertension  - benazepril-hydrochlorthiazide (LOTENSIN HCT) 20-25 MG tablet; Take 1 tablet by mouth daily.  Dispense: 90 tablet; Refill: 0   Follow up plan: Return in about 6 months (around 06/21/2018) for htn.  Carles Collet, PA-C [facility] 12/19/2017, 8:38 AM

## 2018-02-28 DIAGNOSIS — N83202 Unspecified ovarian cyst, left side: Secondary | ICD-10-CM | POA: Diagnosis not present

## 2018-03-26 ENCOUNTER — Other Ambulatory Visit: Payer: Self-pay

## 2018-03-26 DIAGNOSIS — F329 Major depressive disorder, single episode, unspecified: Secondary | ICD-10-CM

## 2018-03-26 DIAGNOSIS — F32A Depression, unspecified: Secondary | ICD-10-CM

## 2018-03-26 DIAGNOSIS — F419 Anxiety disorder, unspecified: Principal | ICD-10-CM

## 2018-03-26 MED ORDER — VENLAFAXINE HCL ER 75 MG PO CP24: 75.0000 mg | ORAL_CAPSULE | Freq: Every day | ORAL | 0 refills | Status: DC

## 2018-03-26 NOTE — Telephone Encounter (Signed)
Refill request approved, has f/u with provider 06/21/18.

## 2018-05-10 ENCOUNTER — Other Ambulatory Visit: Payer: Self-pay | Admitting: Nurse Practitioner

## 2018-05-10 DIAGNOSIS — Z1231 Encounter for screening mammogram for malignant neoplasm of breast: Secondary | ICD-10-CM

## 2018-05-29 ENCOUNTER — Ambulatory Visit
Admission: RE | Admit: 2018-05-29 | Discharge: 2018-05-29 | Disposition: A | Discharge status: Home / Self Care | Payer: BLUE CROSS/BLUE SHIELD | Source: Ambulatory Visit | Attending: Nurse Practitioner | Admitting: Nurse Practitioner

## 2018-05-29 ENCOUNTER — Other Ambulatory Visit: Payer: Self-pay | Admitting: Nurse Practitioner

## 2018-05-29 DIAGNOSIS — N6310 Unspecified lump in the right breast, unspecified quadrant: Secondary | ICD-10-CM

## 2018-05-29 DIAGNOSIS — Z1231 Encounter for screening mammogram for malignant neoplasm of breast: Secondary | ICD-10-CM | POA: Insufficient documentation

## 2018-06-05 ENCOUNTER — Ambulatory Visit
Admission: RE | Admit: 2018-06-05 | Discharge: 2018-06-05 | Disposition: A | Discharge status: Home / Self Care | Payer: BLUE CROSS/BLUE SHIELD | Source: Ambulatory Visit | Attending: Nurse Practitioner | Admitting: Nurse Practitioner

## 2018-06-05 DIAGNOSIS — N6310 Unspecified lump in the right breast, unspecified quadrant: Secondary | ICD-10-CM

## 2018-06-05 DIAGNOSIS — N6489 Other specified disorders of breast: Secondary | ICD-10-CM | POA: Diagnosis not present

## 2018-06-05 DIAGNOSIS — R922 Inconclusive mammogram: Secondary | ICD-10-CM | POA: Diagnosis not present

## 2018-06-21 ENCOUNTER — Ambulatory Visit: Payer: BLUE CROSS/BLUE SHIELD | Admitting: Nurse Practitioner

## 2018-07-05 ENCOUNTER — Ambulatory Visit (INDEPENDENT_AMBULATORY_CARE_PROVIDER_SITE_OTHER): Payer: BLUE CROSS/BLUE SHIELD | Admitting: Nurse Practitioner

## 2018-07-05 ENCOUNTER — Encounter: Payer: Self-pay | Admitting: Nurse Practitioner

## 2018-07-05 VITALS — BP 128/84 | HR 90 | Temp 97.7°F | Ht 68.0 in | Wt 185.2 lb

## 2018-07-05 DIAGNOSIS — I10 Essential (primary) hypertension: Secondary | ICD-10-CM | POA: Diagnosis not present

## 2018-07-05 DIAGNOSIS — M545 Low back pain, unspecified: Secondary | ICD-10-CM

## 2018-07-05 DIAGNOSIS — F419 Anxiety disorder, unspecified: Secondary | ICD-10-CM

## 2018-07-05 DIAGNOSIS — F332 Major depressive disorder, recurrent severe without psychotic features: Secondary | ICD-10-CM

## 2018-07-05 DIAGNOSIS — Z13220 Encounter for screening for lipoid disorders: Secondary | ICD-10-CM | POA: Diagnosis not present

## 2018-07-05 DIAGNOSIS — Z1329 Encounter for screening for other suspected endocrine disorder: Secondary | ICD-10-CM

## 2018-07-05 DIAGNOSIS — F329 Major depressive disorder, single episode, unspecified: Secondary | ICD-10-CM

## 2018-07-05 DIAGNOSIS — F32A Depression, unspecified: Secondary | ICD-10-CM

## 2018-07-05 MED ORDER — BENAZEPRIL-HYDROCHLOROTHIAZIDE 20-25 MG PO TABS: 1.0000 | ORAL_TABLET | Freq: Every day | ORAL | 0 refills | Status: DC

## 2018-07-05 MED ORDER — TRAMADOL HCL 50 MG PO TABS: 50.0000 mg | ORAL_TABLET | Freq: Four times a day (QID) | ORAL | 0 refills | Status: DC | PRN

## 2018-07-05 MED ORDER — VENLAFAXINE HCL ER 75 MG PO CP24: 75.0000 mg | ORAL_CAPSULE | Freq: Every day | ORAL | 0 refills | Status: DC

## 2018-07-05 MED ORDER — BUPROPION HCL ER (SR) 150 MG PO TB12: 150.0000 mg | ORAL_TABLET | Freq: Two times a day (BID) | ORAL | 3 refills | Status: DC

## 2018-07-05 NOTE — Assessment & Plan Note (Signed)
Chronic, ongoing.  Continue current medication regimen.   

## 2018-07-05 NOTE — Assessment & Plan Note (Signed)
OK for occasional Tramadol use.  Recommend frequent stretching and yoga at home.

## 2018-07-05 NOTE — Assessment & Plan Note (Addendum)
Chronic, ongoing.  Continue current medication regimen.  Recommend therapy, patient is going to look into cost and alert provider if referral requested.  She is interested in therapy.

## 2018-07-05 NOTE — Patient Instructions (Signed)
DASH Eating Plan  DASH stands for "Dietary Approaches to Stop Hypertension." The DASH eating plan is a healthy eating plan that has been shown to reduce high blood pressure (hypertension). It may also reduce your risk for type 2 diabetes, heart disease, and stroke. The DASH eating plan may also help with weight loss.  What are tips for following this plan?    General guidelines   Avoid eating more than 2,300 mg (milligrams) of salt (sodium) a day. If you have hypertension, you may need to reduce your sodium intake to 1,500 mg a day.   Limit alcohol intake to no more than 1 drink a day for nonpregnant women and 2 drinks a day for men. One drink equals 12 oz of beer, 5 oz of wine, or 1 oz of hard liquor.   Work with your health care provider to maintain a healthy body weight or to lose weight. Ask what an ideal weight is for you.   Get at least 30 minutes of exercise that causes your heart to beat faster (aerobic exercise) most days of the week. Activities may include walking, swimming, or biking.   Work with your health care provider or diet and nutrition specialist (dietitian) to adjust your eating plan to your individual calorie needs.  Reading food labels     Check food labels for the amount of sodium per serving. Choose foods with less than 5 percent of the Daily Value of sodium. Generally, foods with less than 300 mg of sodium per serving fit into this eating plan.   To find whole grains, look for the word "whole" as the first word in the ingredient list.  Shopping   Buy products labeled as "low-sodium" or "no salt added."   Buy fresh foods. Avoid canned foods and premade or frozen meals.  Cooking   Avoid adding salt when cooking. Use salt-free seasonings or herbs instead of table salt or sea salt. Check with your health care provider or pharmacist before using salt substitutes.   Do not fry foods. Cook foods using healthy methods such as baking, boiling, grilling, and broiling instead.   Cook with  heart-healthy oils, such as olive, canola, soybean, or sunflower oil.  Meal planning   Eat a balanced diet that includes:  ? 5 or more servings of fruits and vegetables each day. At each meal, try to fill half of your plate with fruits and vegetables.  ? Up to 6-8 servings of whole grains each day.  ? Less than 6 oz of lean meat, poultry, or fish each day. A 3-oz serving of meat is about the same size as a deck of cards. One egg equals 1 oz.  ? 2 servings of low-fat dairy each day.  ? A serving of nuts, seeds, or beans 5 times each week.  ? Heart-healthy fats. Healthy fats called Omega-3 fatty acids are found in foods such as flaxseeds and coldwater fish, like sardines, salmon, and mackerel.   Limit how much you eat of the following:  ? Canned or prepackaged foods.  ? Food that is high in trans fat, such as fried foods.  ? Food that is high in saturated fat, such as fatty meat.  ? Sweets, desserts, sugary drinks, and other foods with added sugar.  ? Full-fat dairy products.   Do not salt foods before eating.   Try to eat at least 2 vegetarian meals each week.   Eat more home-cooked food and less restaurant, buffet, and fast food.     When eating at a restaurant, ask that your food be prepared with less salt or no salt, if possible.  What foods are recommended?  The items listed may not be a complete list. Talk with your dietitian about what dietary choices are best for you.  Grains  Whole-grain or whole-wheat bread. Whole-grain or whole-wheat pasta. Brown rice. Oatmeal. Quinoa. Bulgur. Whole-grain and low-sodium cereals. Pita bread. Low-fat, low-sodium crackers. Whole-wheat flour tortillas.  Vegetables  Fresh or frozen vegetables (raw, steamed, roasted, or grilled). Low-sodium or reduced-sodium tomato and vegetable juice. Low-sodium or reduced-sodium tomato sauce and tomato paste. Low-sodium or reduced-sodium canned vegetables.  Fruits  All fresh, dried, or frozen fruit. Canned fruit in natural juice (without  added sugar).  Meat and other protein foods  Skinless chicken or turkey. Ground chicken or turkey. Pork with fat trimmed off. Fish and seafood. Egg whites. Dried beans, peas, or lentils. Unsalted nuts, nut butters, and seeds. Unsalted canned beans. Lean cuts of beef with fat trimmed off. Low-sodium, lean deli meat.  Dairy  Low-fat (1%) or fat-free (skim) milk. Fat-free, low-fat, or reduced-fat cheeses. Nonfat, low-sodium ricotta or cottage cheese. Low-fat or nonfat yogurt. Low-fat, low-sodium cheese.  Fats and oils  Soft margarine without trans fats. Vegetable oil. Low-fat, reduced-fat, or light mayonnaise and salad dressings (reduced-sodium). Canola, safflower, olive, soybean, and sunflower oils. Avocado.  Seasoning and other foods  Herbs. Spices. Seasoning mixes without salt. Unsalted popcorn and pretzels. Fat-free sweets.  What foods are not recommended?  The items listed may not be a complete list. Talk with your dietitian about what dietary choices are best for you.  Grains  Baked goods made with fat, such as croissants, muffins, or some breads. Dry pasta or rice meal packs.  Vegetables  Creamed or fried vegetables. Vegetables in a cheese sauce. Regular canned vegetables (not low-sodium or reduced-sodium). Regular canned tomato sauce and paste (not low-sodium or reduced-sodium). Regular tomato and vegetable juice (not low-sodium or reduced-sodium). Pickles. Olives.  Fruits  Canned fruit in a light or heavy syrup. Fried fruit. Fruit in cream or butter sauce.  Meat and other protein foods  Fatty cuts of meat. Ribs. Fried meat. Bacon. Sausage. Bologna and other processed lunch meats. Salami. Fatback. Hotdogs. Bratwurst. Salted nuts and seeds. Canned beans with added salt. Canned or smoked fish. Whole eggs or egg yolks. Chicken or turkey with skin.  Dairy  Whole or 2% milk, cream, and half-and-half. Whole or full-fat cream cheese. Whole-fat or sweetened yogurt. Full-fat cheese. Nondairy creamers. Whipped toppings.  Processed cheese and cheese spreads.  Fats and oils  Butter. Stick margarine. Lard. Shortening. Ghee. Bacon fat. Tropical oils, such as coconut, palm kernel, or palm oil.  Seasoning and other foods  Salted popcorn and pretzels. Onion salt, garlic salt, seasoned salt, table salt, and sea salt. Worcestershire sauce. Tartar sauce. Barbecue sauce. Teriyaki sauce. Soy sauce, including reduced-sodium. Steak sauce. Canned and packaged gravies. Fish sauce. Oyster sauce. Cocktail sauce. Horseradish that you find on the shelf. Ketchup. Mustard. Meat flavorings and tenderizers. Bouillon cubes. Hot sauce and Tabasco sauce. Premade or packaged marinades. Premade or packaged taco seasonings. Relishes. Regular salad dressings.  Where to find more information:   National Heart, Lung, and Blood Institute: www.nhlbi.nih.gov   American Heart Association: www.heart.org  Summary   The DASH eating plan is a healthy eating plan that has been shown to reduce high blood pressure (hypertension). It may also reduce your risk for type 2 diabetes, heart disease, and stroke.   With the   DASH eating plan, you should limit salt (sodium) intake to 2,300 mg a day. If you have hypertension, you may need to reduce your sodium intake to 1,500 mg a day.   When on the DASH eating plan, aim to eat more fresh fruits and vegetables, whole grains, lean proteins, low-fat dairy, and heart-healthy fats.   Work with your health care provider or diet and nutrition specialist (dietitian) to adjust your eating plan to your individual calorie needs.  This information is not intended to replace advice given to you by your health care provider. Make sure you discuss any questions you have with your health care provider.  Document Released: 04/13/2011 Document Revised: 04/17/2016 Document Reviewed: 04/17/2016  Elsevier Interactive Patient Education  2019 [employer]

## 2018-07-05 NOTE — Assessment & Plan Note (Signed)
Chronic, initial BP elevated and not checking at home.  Repeat below goal.  Recommend checking BP at home 3 times a week.  Continue current medication regimen.  CBC and CMP today.

## 2018-07-05 NOTE — Progress Notes (Signed)
BP 128/84 (BP Location: Left Arm, Patient Position: Sitting)   Pulse 90   Temp 97.7 F (36.5 C) (Oral)   Ht 5\' 8"  (1.727 m)   Wt 185 lb 3.2 oz (84 kg)   SpO2 100%   BMI 28.16 kg/m    Subjective:    Patient ID: Alexandra Le, female    DOB: 11-Dec-1970, 48 y.o.   MRN: [id number]  HPI: Alexandra Le is a 48 y.o. female  Chief Complaint  Patient presents with  . Hypertension    6 month F/U. No complaints.  . Anxiety  . Depression  . Medication Refill    Tramadol   HYPERTENSION Hypertension status: stable  Satisfied with current treatment? yes Duration of hypertension: chronic BP monitoring frequency:  not checking BP range:  BP medication side effects:  no Medication compliance: good compliance Aspirin: no Recurrent headaches: no Visual changes: no Palpitations: no Dyspnea: no Chest pain: no Lower extremity edema: no Dizzy/lightheaded: no   ANXIETY/DEPRESSION Duration:stable Anxious mood: no  Excessive worrying: sometimes Irritability: yes , a couple days a week Sweating: no Nausea: no Palpitations:occasionally, when she gets more anxious Hyperventilation: no Panic attacks: no Agoraphobia: no  Obscessions/compulsions: no Depressed mood: occasional Depression screen [facility] 2/9 07/05/2018 12/19/2017 06/20/2017 05/16/2017 06/16/2016  Decreased Interest 0 0 0 1 0  Down, Depressed, Hopeless 1 0 0 1 0  PHQ - 2 Score 1 0 0 2 0  Altered sleeping 3 2 1 3  -  Tired, decreased energy 1 1 1 3  -  Change in appetite 1 1 2 3  -  Feeling bad or failure about yourself  0 0 0 1 -  Trouble concentrating 1 1 0 2 -  Moving slowly or fidgety/restless 0 0 0 2 -  Suicidal thoughts 0 0 0 0 -  PHQ-9 Score 7 5 4 16  -  Difficult doing work/chores Not difficult at all - - - -   Anhedonia: no Weight changes: has some gain, now is exercising and had some loss Insomnia: yes hard to stay asleep  Hypersomnia: no Fatigue/loss of energy: yes Feelings of worthlessness:  no Feelings of guilt: no Impaired concentration/indecisiveness: no Suicidal ideations: no  Crying spells: occasional Recent Stressors/Life Changes: yes   Relationship problems: no   Family stress: yes     Financial stress: yes    Job stress: yes    Recent death/loss: no  LOWER BACK PAIN: Uses Tramadol for lower back, was placed on it years ago.  Uses minimally, approx twice a month.  This assists in relieving back pain and her ability to function.  She has ongoing back pain for several years.  30 tablet Tramadol lasted one year.  Relevant past medical, surgical, family and social history reviewed and updated as indicated. Interim medical history since our last visit reviewed. Allergies and medications reviewed and updated.  Review of Systems  Constitutional: Negative for activity change, appetite change, diaphoresis, fatigue and fever.  Respiratory: Negative for cough, chest tightness and shortness of breath.   Cardiovascular: Negative for chest pain, palpitations and leg swelling.  Gastrointestinal: Negative for abdominal distention, abdominal pain, constipation, diarrhea, nausea and vomiting.  Endocrine: Negative for cold intolerance, heat intolerance, polydipsia, polyphagia and polyuria.  Neurological: Negative for dizziness, syncope, weakness, light-headedness, numbness and headaches.  Psychiatric/Behavioral: Negative.     Per HPI unless specifically indicated above     Objective:    BP 128/84 (BP Location: Left Arm, Patient Position: Sitting)   Pulse 90  Temp 97.7 F (36.5 C) (Oral)   Ht 5\' 8"  (1.727 m)   Wt 185 lb 3.2 oz (84 kg)   SpO2 100%   BMI 28.16 kg/m   Wt Readings from Last 3 Encounters:  07/05/18 185 lb 3.2 oz (84 kg)  12/19/17 180 lb (81.6 kg)  06/20/17 181 lb 9.6 oz (82.4 kg)    Physical Exam Vitals signs and nursing note reviewed.  Constitutional:      Appearance: She is well-developed.  HENT:     Head: Normocephalic.  Eyes:     General:         Right eye: No discharge.        Left eye: No discharge.     Conjunctiva/sclera: Conjunctivae normal.     Pupils: Pupils are equal, round, and reactive to light.  Neck:     Musculoskeletal: Normal range of motion and neck supple.     Thyroid: No thyromegaly.     Vascular: No carotid bruit or JVD.  Cardiovascular:     Rate and Rhythm: Normal rate and regular rhythm.     Heart sounds: Normal heart sounds. No murmur. No gallop.   Pulmonary:     Effort: Pulmonary effort is normal.     Breath sounds: Normal breath sounds.  Abdominal:     General: Bowel sounds are normal.     Palpations: Abdomen is soft.  Musculoskeletal:     Right lower leg: No edema.     Left lower leg: No edema.  Lymphadenopathy:     Cervical: No cervical adenopathy.  Skin:    General: Skin is warm and dry.  Neurological:     Mental Status: She is alert and oriented to person, place, and time.  Psychiatric:        Mood and Affect: Mood normal.        Behavior: Behavior normal.        Thought Content: Thought content normal.        Judgment: Judgment normal.     Results for orders placed or performed in visit on 06/20/17  CBC with Differential/Platelet  Result Value Ref Range   WBC 7.4 3.4 - 10.8 x10E3/uL   RBC 4.21 3.77 - 5.28 x10E6/uL   Hemoglobin 12.7 11.1 - 15.9 g/dL   Hematocrit 38.7 34.0 - 46.6 %   MCV 92 79 - 97 fL   MCH 30.2 26.6 - 33.0 pg   MCHC 32.8 31.5 - 35.7 g/dL   RDW 13.3 12.3 - 15.4 %   Platelets 342 150 - 379 x10E3/uL   Neutrophils 72 Not Estab. %   Lymphs 21 Not Estab. %   Monocytes 5 Not Estab. %   Eos 2 Not Estab. %   Basos 0 Not Estab. %   Neutrophils Absolute 5.3 1.4 - 7.0 x10E3/uL   Lymphocytes Absolute 1.5 0.7 - 3.1 x10E3/uL   Monocytes Absolute 0.4 0.1 - 0.9 x10E3/uL   EOS (ABSOLUTE) 0.2 0.0 - 0.4 x10E3/uL   Basophils Absolute 0.0 0.0 - 0.2 x10E3/uL   Immature Granulocytes 0 Not Estab. %   Immature Grans (Abs) 0.0 0.0 - 0.1 x10E3/uL  Comprehensive metabolic panel  Result  Value Ref Range   Glucose 81 65 - 99 mg/dL   BUN 8 6 - 24 mg/dL   Creatinine, Ser 0.82 0.57 - 1.00 mg/dL   GFR calc non Af Amer 85 >59 mL/min/1.73   GFR calc Af Amer 98 >59 mL/min/1.73   BUN/Creatinine Ratio 10 9 - 23  Sodium 143 134 - 144 mmol/L   Potassium 4.7 3.5 - 5.2 mmol/L   Chloride 104 96 - 106 mmol/L   CO2 23 20 - 29 mmol/L   Calcium 9.5 8.7 - 10.2 mg/dL   Total Protein 7.0 6.0 - 8.5 g/dL   Albumin 4.4 3.5 - 5.5 g/dL   Globulin, Total 2.6 1.5 - 4.5 g/dL   Albumin/Globulin Ratio 1.7 1.2 - 2.2   Bilirubin Total 0.2 0.0 - 1.2 mg/dL   Alkaline Phosphatase 85 39 - 117 IU/L   AST 17 0 - 40 IU/L   ALT 16 0 - 32 IU/L  Lipid Panel w/o Chol/HDL Ratio  Result Value Ref Range   Cholesterol, Total 178 100 - 199 mg/dL   Triglycerides 132 0 - 149 mg/dL   HDL 74 >39 mg/dL   VLDL Cholesterol Cal 26 5 - 40 mg/dL   LDL Calculated 78 0 - 99 mg/dL  TSH  Result Value Ref Range   TSH 1.530 0.450 - 4.500 uIU/mL      Assessment & Plan:   Problem List Items Addressed This Visit      Cardiovascular and Mediastinum   Hypertension    Chronic, initial BP elevated and not checking at home.  Repeat below goal.  Recommend checking BP at home 3 times a week.  Continue current medication regimen.  CBC and CMP today.      Relevant Medications   benazepril-hydrochlorthiazide (LOTENSIN HCT) 20-25 MG tablet   Other Relevant Orders   CBC with Differential/Platelet   Comprehensive metabolic panel     Other   Anxiety and depression    Chronic, ongoing.  Continue current medication regimen.        Relevant Medications   venlafaxine XR (EFFEXOR-XR) 75 MG 24 hr capsule   buPROPion (WELLBUTRIN SR) 150 MG 12 hr tablet   benazepril-hydrochlorthiazide (LOTENSIN HCT) 20-25 MG tablet   Depression - Primary    Chronic, ongoing.  Continue current medication regimen.  Recommend therapy, patient is going to look into cost and alert provider if referral requested.  She is interested in therapy.       Relevant Medications   venlafaxine XR (EFFEXOR-XR) 75 MG 24 hr capsule   buPROPion (WELLBUTRIN SR) 150 MG 12 hr tablet   Lumbago    OK for occasional Tramadol use.  Recommend frequent stretching and yoga at home.      Relevant Medications   traMADol (ULTRAM) 50 MG tablet    Other Visit Diagnoses    Screening cholesterol level       Relevant Orders   Lipid Panel w/o Chol/HDL Ratio   Thyroid disorder screen       Relevant Orders   TSH      Controlled substance.  Checked data base and no other refills of controlled substances noted, last refill one year ago on review.  Follow up plan: Return in about 6 months (around 01/03/2019) for HTN and Depression/anxiety.

## 2018-07-06 LAB — CBC WITH DIFFERENTIAL/PLATELET
BASOS ABS: 0.1 10*3/uL (ref 0.0–0.2)
Basos: 1 %
EOS (ABSOLUTE): 0.2 10*3/uL (ref 0.0–0.4)
Eos: 2 %
Hematocrit: 42.8 % (ref 34.0–46.6)
Hemoglobin: 14.6 g/dL (ref 11.1–15.9)
Immature Grans (Abs): 0.1 10*3/uL (ref 0.0–0.1)
Immature Granulocytes: 1 %
Lymphocytes Absolute: 1.7 10*3/uL (ref 0.7–3.1)
Lymphs: 19 %
MCH: 31.4 pg (ref 26.6–33.0)
MCHC: 34.1 g/dL (ref 31.5–35.7)
MCV: 92 fL (ref 79–97)
Monocytes Absolute: 0.5 10*3/uL (ref 0.1–0.9)
Monocytes: 6 %
Neutrophils Absolute: 6.5 10*3/uL (ref 1.4–7.0)
Neutrophils: 71 %
Platelets: 406 10*3/uL (ref 150–450)
RBC: 4.65 x10E6/uL (ref 3.77–5.28)
RDW: 12.7 % (ref 11.7–15.4)
WBC: 9 10*3/uL (ref 3.4–10.8)

## 2018-07-06 LAB — COMPREHENSIVE METABOLIC PANEL
ALT: 22 IU/L (ref 0–32)
AST: 24 IU/L (ref 0–40)
Albumin/Globulin Ratio: 2 (ref 1.2–2.2)
Albumin: 4.7 g/dL (ref 3.8–4.8)
Alkaline Phosphatase: 93 IU/L (ref 39–117)
BUN/Creatinine Ratio: 12 (ref 9–23)
BUN: 12 mg/dL (ref 6–24)
Bilirubin Total: 0.4 mg/dL (ref 0.0–1.2)
CHLORIDE: 98 mmol/L (ref 96–106)
CO2: 24 mmol/L (ref 20–29)
CREATININE: 0.98 mg/dL (ref 0.57–1.00)
Calcium: 9.7 mg/dL (ref 8.7–10.2)
GFR calc Af Amer: 78 mL/min/{1.73_m2} (ref >59)
GFR calc non Af Amer: 68 mL/min/{1.73_m2} (ref >59)
GLUCOSE: 56 mg/dL — AB (ref 65–99)
Globulin, Total: 2.4 g/dL (ref 1.5–4.5)
POTASSIUM: 5.1 mmol/L (ref 3.5–5.2)
Sodium: 140 mmol/L (ref 134–144)
Total Protein: 7.1 g/dL (ref 6.0–8.5)

## 2018-07-06 LAB — LIPID PANEL W/O CHOL/HDL RATIO
Cholesterol, Total: 207 mg/dL — ABNORMAL HIGH (ref 100–199)
HDL: 82 mg/dL (ref >39)
LDL Calculated: 100 mg/dL — ABNORMAL HIGH (ref 0–99)
Triglycerides: 127 mg/dL (ref 0–149)
VLDL Cholesterol Cal: 25 mg/dL (ref 5–40)

## 2018-07-06 LAB — TSH: TSH: 2.01 u[IU]/mL (ref 0.450–4.500)

## 2018-10-15 ENCOUNTER — Other Ambulatory Visit: Payer: Self-pay | Admitting: Nurse Practitioner

## 2018-10-15 DIAGNOSIS — F329 Major depressive disorder, single episode, unspecified: Secondary | ICD-10-CM

## 2018-10-15 DIAGNOSIS — F419 Anxiety disorder, unspecified: Secondary | ICD-10-CM

## 2018-10-15 DIAGNOSIS — F32A Depression, unspecified: Secondary | ICD-10-CM

## 2018-10-17 ENCOUNTER — Other Ambulatory Visit: Payer: Self-pay

## 2018-10-17 MED ORDER — TRAMADOL HCL 50 MG PO TABS: 50.0000 mg | ORAL_TABLET | Freq: Four times a day (QID) | ORAL | 0 refills | Status: DC | PRN

## 2018-10-17 NOTE — Telephone Encounter (Signed)
Patient last seen 07/05/18 and has appointment 01/03/19.

## 2018-10-28 ENCOUNTER — Ambulatory Visit (INDEPENDENT_AMBULATORY_CARE_PROVIDER_SITE_OTHER): Payer: BC Managed Care – PPO | Admitting: Family Medicine

## 2018-10-28 ENCOUNTER — Other Ambulatory Visit: Payer: Self-pay

## 2018-10-28 ENCOUNTER — Encounter: Payer: Self-pay | Admitting: Family Medicine

## 2018-10-28 VITALS — Temp 97.8°F

## 2018-10-28 DIAGNOSIS — R21 Rash and other nonspecific skin eruption: Secondary | ICD-10-CM | POA: Diagnosis not present

## 2018-10-28 MED ORDER — PREDNISONE 10 MG PO TABS: ORAL_TABLET | ORAL | 0 refills | Status: DC

## 2018-10-28 NOTE — Progress Notes (Signed)
Temp 97.8 F (36.6 C)    Subjective:    Patient ID: Alexandra Le, female    DOB: 04-27-1971, 48 y.o.   MRN: [id number]  HPI: Alexandra Le is a 48 y.o. female  Chief Complaint  Patient presents with  . Rash    all over body, has been taking Benadryl since yesterday. Swelling in lips    . This visit was completed via WebEx due to the restrictions of the COVID-19 pandemic. All issues as above were discussed and addressed. Physical exam was done as above through visual confirmation on WebEx. If it was felt that the patient should be evaluated in the office, they were directed there. The patient verbally consented to this visit. . Location of the patient: home . Location of the provider: home . Those involved with this call:  . Provider: Merrie Roof, PA-C . CMA: Tiffany Reel, CMA . Front Desk/Registration: Jill Side  . Time spent on call: 15 minutes with patient face to face via video conference. More than 50% of this time was spent in counseling and coordination of care. 5 minutes total spent in review of patient's record and preparation of their chart. I verified patient identity using two factors (patient name and date of birth). Patient consents verbally to being seen via telemedicine visit today.   Generalized rash that has been worsening since yesterday, and now some swelling in lips. Rash is itchy and sore. Benadryl is not helping. Only new thing lately has been some crawfish that she ate for the first time on Saturday. Denies throat itching or swelling, difficulty breathing, wheezing, chest tightness, fevers.   Relevant past medical, surgical, family and social history reviewed and updated as indicated. Interim medical history since our last visit reviewed. Allergies and medications reviewed and updated.  Review of Systems  Per HPI unless specifically indicated above     Objective:    Temp 97.8 F (36.6 C)   Wt Readings from Last 3 Encounters:   07/05/18 185 lb 3.2 oz (84 kg)  12/19/17 180 lb (81.6 kg)  06/20/17 181 lb 9.6 oz (82.4 kg)    Physical Exam Vitals signs and nursing note reviewed.  Constitutional:      General: She is not in acute distress.    Appearance: Normal appearance.  HENT:     Head: Atraumatic.     Right Ear: External ear normal.     Left Ear: External ear normal.     Nose: Nose normal. No congestion.     Mouth/Throat:     Mouth: Mucous membranes are moist.     Pharynx: Oropharynx is clear. No posterior oropharyngeal erythema.  Eyes:     Extraocular Movements: Extraocular movements intact.     Conjunctiva/sclera: Conjunctivae normal.  Neck:     Musculoskeletal: Normal range of motion.  Cardiovascular:     Comments: Unable to assess via virtual visit Pulmonary:     Effort: Pulmonary effort is normal. No respiratory distress.  Musculoskeletal: Normal range of motion.  Skin:    General: Skin is dry.     Findings: Rash (mild diffuse urticarial rash) present.  Neurological:     Mental Status: She is alert and oriented to person, place, and time.  Psychiatric:        Mood and Affect: Mood normal.        Thought Content: Thought content normal.        Judgment: Judgment normal.     Results for orders placed or  performed in visit on 07/05/18  CBC with Differential/Platelet  Result Value Ref Range   WBC 9.0 3.4 - 10.8 x10E3/uL   RBC 4.65 3.77 - 5.28 x10E6/uL   Hemoglobin 14.6 11.1 - 15.9 g/dL   Hematocrit 42.8 34.0 - 46.6 %   MCV 92 79 - 97 fL   MCH 31.4 26.6 - 33.0 pg   MCHC 34.1 31.5 - 35.7 g/dL   RDW 12.7 11.7 - 15.4 %   Platelets 406 150 - 450 x10E3/uL   Neutrophils 71 Not Estab. %   Lymphs 19 Not Estab. %   Monocytes 6 Not Estab. %   Eos 2 Not Estab. %   Basos 1 Not Estab. %   Neutrophils Absolute 6.5 1.4 - 7.0 x10E3/uL   Lymphocytes Absolute 1.7 0.7 - 3.1 x10E3/uL   Monocytes Absolute 0.5 0.1 - 0.9 x10E3/uL   EOS (ABSOLUTE) 0.2 0.0 - 0.4 x10E3/uL   Basophils Absolute 0.1 0.0 - 0.2  x10E3/uL   Immature Granulocytes 1 Not Estab. %   Immature Grans (Abs) 0.1 0.0 - 0.1 x10E3/uL  Comprehensive metabolic panel  Result Value Ref Range   Glucose 56 (L) 65 - 99 mg/dL   BUN 12 6 - 24 mg/dL   Creatinine, Ser 0.98 0.57 - 1.00 mg/dL   GFR calc non Af Amer 68 >59 mL/min/1.73   GFR calc Af Amer 78 >59 mL/min/1.73   BUN/Creatinine Ratio 12 9 - 23   Sodium 140 134 - 144 mmol/L   Potassium 5.1 3.5 - 5.2 mmol/L   Chloride 98 96 - 106 mmol/L   CO2 24 20 - 29 mmol/L   Calcium 9.7 8.7 - 10.2 mg/dL   Total Protein 7.1 6.0 - 8.5 g/dL   Albumin 4.7 3.8 - 4.8 g/dL   Globulin, Total 2.4 1.5 - 4.5 g/dL   Albumin/Globulin Ratio 2.0 1.2 - 2.2   Bilirubin Total 0.4 0.0 - 1.2 mg/dL   Alkaline Phosphatase 93 39 - 117 IU/L   AST 24 0 - 40 IU/L   ALT 22 0 - 32 IU/L  Lipid Panel w/o Chol/HDL Ratio  Result Value Ref Range   Cholesterol, Total 207 (H) 100 - 199 mg/dL   Triglycerides 127 0 - 149 mg/dL   HDL 82 >39 mg/dL   VLDL Cholesterol Cal 25 5 - 40 mg/dL   LDL Calculated 100 (H) 0 - 99 mg/dL  TSH  Result Value Ref Range   TSH 2.010 0.450 - 4.500 uIU/mL      Assessment & Plan:   Problem List Items Addressed This Visit    None    Visit Diagnoses    Rash    -  Primary   Suspect allergic reaction, possibly to crawfish. Extended prednisone taper, continue benadryl. Return precautions reviewed at length.        Follow up plan: Return if symptoms worsen or fail to improve.

## 2018-12-26 ENCOUNTER — Other Ambulatory Visit: Payer: Self-pay | Admitting: Nurse Practitioner

## 2018-12-26 DIAGNOSIS — F32A Depression, unspecified: Secondary | ICD-10-CM

## 2018-12-26 DIAGNOSIS — F329 Major depressive disorder, single episode, unspecified: Secondary | ICD-10-CM

## 2018-12-26 DIAGNOSIS — F419 Anxiety disorder, unspecified: Secondary | ICD-10-CM

## 2018-12-26 NOTE — Telephone Encounter (Signed)
Forwarding medication refill request to PCP for review. 

## 2019-01-03 ENCOUNTER — Other Ambulatory Visit: Payer: Self-pay

## 2019-01-03 ENCOUNTER — Ambulatory Visit (INDEPENDENT_AMBULATORY_CARE_PROVIDER_SITE_OTHER): Payer: BC Managed Care – PPO | Admitting: Nurse Practitioner

## 2019-01-03 ENCOUNTER — Encounter: Payer: Self-pay | Admitting: Nurse Practitioner

## 2019-01-03 DIAGNOSIS — F5101 Primary insomnia: Secondary | ICD-10-CM

## 2019-01-03 DIAGNOSIS — F332 Major depressive disorder, recurrent severe without psychotic features: Secondary | ICD-10-CM

## 2019-01-03 MED ORDER — VENLAFAXINE HCL ER 37.5 MG PO CP24: 37.5000 mg | ORAL_CAPSULE | Freq: Every day | ORAL | 0 refills | Status: DC

## 2019-01-03 MED ORDER — TRAZODONE HCL 50 MG PO TABS: 25.0000 mg | ORAL_TABLET | Freq: Every day | ORAL | 1 refills | Status: DC

## 2019-01-03 NOTE — Assessment & Plan Note (Signed)
Ongoing issue for years.  At this time will reduce Effexor to 37.5MG  and add on Trazodone 25 MG at night + continue current Wellbutrin dose. Plan to have patient return in 2 weeks and at that time will discontinue Effexor if tolerating reduction and increase Trazodone if needed.  Trazodone may benefit both mood and sleep pattern.

## 2019-01-03 NOTE — Assessment & Plan Note (Signed)
Chronic, ongoing with insomnia.  At this time will reduce Effexor to 37.5MG  and add on Trazodone 25 MG at night + continue current Wellbutrin dose. Plan to have patient return in 2 weeks and at that time will discontinue Effexor if tolerating reduction and increase Trazodone if needed.

## 2019-01-03 NOTE — Patient Instructions (Signed)

## 2019-01-03 NOTE — Progress Notes (Signed)
There were no vitals taken for this visit.   Subjective:    Patient ID: Alexandra Le, female    DOB: Apr 23, 1971, 48 y.o.   MRN: [id number]  HPI: Alexandra Le is a 48 y.o. female  Chief Complaint  Patient presents with  . Depression    . This visit was completed via Doximity due to the restrictions of the COVID-19 pandemic. All issues as above were discussed and addressed. Physical exam was done as above through visual confirmation on Doximity. If it was felt that the patient should be evaluated in the office, they were directed there. The patient verbally consented to this visit. . Location of the patient: home . Location of the provider: home . Those involved with this call:  . Provider: Marnee Guarneri, DNP . CMA: Yvonna Alanis, CMA . Front Desk/Registration: Jill Side  . Time spent on call: 15 minutes with patient face to face via video conference. More than 50% of this time was spent in counseling and coordination of care. 10 minutes total spent in review of patient's record and preparation of their chart.  . I verified patient identity using two factors (patient name and date of birth). Patient consents verbally to being seen via telemedicine visit today.    DEPRESSION Continues on Effexor 75 MG daily and Wellbutrin 150 MG BID.  Reports her biggest issue is insomnia, has been issue for long time.  She cares for two of her grandchildren.  States she has tried Melatonin with no benefit, also has tried having a glass or two of wine on occasion at night, but this does not always help and she does not want this to become a habit.  Discussed with her performing reduction to discontinuation of her Effexor and changing to Trazodone, which may offer sleep and mood benefit.  Educated her on Trazodone risks/benefits.  She would like to try this. Mood status: stable Satisfied with current treatment?: yes Symptom severity: mild  Duration of current treatment : chronic  Side effects: no Medication compliance: good compliance Psychotherapy/counseling: none Previous psychiatric medications: Effexor and Wellbutrin Depressed mood: no Anxious mood: no Anhedonia: no Significant weight loss or gain: no Insomnia: yes hard to stay asleep Fatigue: yes Feelings of worthlessness or guilt: no Impaired concentration/indecisiveness: yes Suicidal ideations: no Hopelessness: no Crying spells: no Depression screen [facility] 2/9 01/03/2019 07/05/2018 12/19/2017 06/20/2017 05/16/2017  Decreased Interest 0 0 0 0 1  Down, Depressed, Hopeless 0 1 0 0 1  PHQ - 2 Score 0 1 0 0 2  Altered sleeping 3 3 2 1 3   Tired, decreased energy 2 1 1 1 3   Change in appetite 1 1 1 2 3   Feeling bad or failure about yourself  0 0 0 0 1  Trouble concentrating 1 1 1  0 2  Moving slowly or fidgety/restless 1 0 0 0 2  Suicidal thoughts 0 0 0 0 0  PHQ-9 Score 8 7 5 4 16   Difficult doing work/chores Somewhat difficult Not difficult at all - - -    Relevant past medical, surgical, family and social history reviewed and updated as indicated. Interim medical history since our last visit reviewed. Allergies and medications reviewed and updated.  Review of Systems  Constitutional: Negative for activity change, appetite change, diaphoresis, fatigue and fever.  Respiratory: Negative for cough, chest tightness and shortness of breath.   Cardiovascular: Negative for chest pain, palpitations and leg swelling.  Gastrointestinal: Negative for abdominal distention, abdominal pain, constipation, diarrhea, nausea  and vomiting.  Neurological: Negative for dizziness, syncope, weakness, light-headedness, numbness and headaches.  Psychiatric/Behavioral: Positive for sleep disturbance. Negative for decreased concentration, self-injury and suicidal ideas. The patient is not nervous/anxious.     Per HPI unless specifically indicated above     Objective:    There were no vitals taken for this visit.  Wt Readings  from Last 3 Encounters:  07/05/18 185 lb 3.2 oz (84 kg)  12/19/17 180 lb (81.6 kg)  06/20/17 181 lb 9.6 oz (82.4 kg)    Physical Exam Vitals signs and nursing note reviewed.  Constitutional:      General: She is awake. She is not in acute distress.    Appearance: She is well-developed. She is not ill-appearing.  HENT:     Head: Normocephalic.     Right Ear: Hearing normal.     Left Ear: Hearing normal.  Eyes:     General: Lids are normal.        Right eye: No discharge.        Left eye: No discharge.     Conjunctiva/sclera: Conjunctivae normal.  Neck:     Musculoskeletal: Normal range of motion.  Cardiovascular:     Comments: Unable to auscultate due to virtual exam only  Pulmonary:     Effort: Pulmonary effort is normal. No accessory muscle usage or respiratory distress.     Comments: Unable to auscultate due to virtual exam only  Neurological:     Mental Status: She is alert and oriented to person, place, and time.  Psychiatric:        Attention and Perception: Attention normal.        Mood and Affect: Mood normal.        Behavior: Behavior normal. Behavior is cooperative.        Thought Content: Thought content normal.        Judgment: Judgment normal.     Results for orders placed or performed in visit on 07/05/18  CBC with Differential/Platelet  Result Value Ref Range   WBC 9.0 3.4 - 10.8 x10E3/uL   RBC 4.65 3.77 - 5.28 x10E6/uL   Hemoglobin 14.6 11.1 - 15.9 g/dL   Hematocrit 42.8 34.0 - 46.6 %   MCV 92 79 - 97 fL   MCH 31.4 26.6 - 33.0 pg   MCHC 34.1 31.5 - 35.7 g/dL   RDW 12.7 11.7 - 15.4 %   Platelets 406 150 - 450 x10E3/uL   Neutrophils 71 Not Estab. %   Lymphs 19 Not Estab. %   Monocytes 6 Not Estab. %   Eos 2 Not Estab. %   Basos 1 Not Estab. %   Neutrophils Absolute 6.5 1.4 - 7.0 x10E3/uL   Lymphocytes Absolute 1.7 0.7 - 3.1 x10E3/uL   Monocytes Absolute 0.5 0.1 - 0.9 x10E3/uL   EOS (ABSOLUTE) 0.2 0.0 - 0.4 x10E3/uL   Basophils Absolute 0.1 0.0  - 0.2 x10E3/uL   Immature Granulocytes 1 Not Estab. %   Immature Grans (Abs) 0.1 0.0 - 0.1 x10E3/uL  Comprehensive metabolic panel  Result Value Ref Range   Glucose 56 (L) 65 - 99 mg/dL   BUN 12 6 - 24 mg/dL   Creatinine, Ser 0.98 0.57 - 1.00 mg/dL   GFR calc non Af Amer 68 >59 mL/min/1.73   GFR calc Af Amer 78 >59 mL/min/1.73   BUN/Creatinine Ratio 12 9 - 23   Sodium 140 134 - 144 mmol/L   Potassium 5.1 3.5 - 5.2 mmol/L  Chloride 98 96 - 106 mmol/L   CO2 24 20 - 29 mmol/L   Calcium 9.7 8.7 - 10.2 mg/dL   Total Protein 7.1 6.0 - 8.5 g/dL   Albumin 4.7 3.8 - 4.8 g/dL   Globulin, Total 2.4 1.5 - 4.5 g/dL   Albumin/Globulin Ratio 2.0 1.2 - 2.2   Bilirubin Total 0.4 0.0 - 1.2 mg/dL   Alkaline Phosphatase 93 39 - 117 IU/L   AST 24 0 - 40 IU/L   ALT 22 0 - 32 IU/L  Lipid Panel w/o Chol/HDL Ratio  Result Value Ref Range   Cholesterol, Total 207 (H) 100 - 199 mg/dL   Triglycerides 127 0 - 149 mg/dL   HDL 82 >39 mg/dL   VLDL Cholesterol Cal 25 5 - 40 mg/dL   LDL Calculated 100 (H) 0 - 99 mg/dL  TSH  Result Value Ref Range   TSH 2.010 0.450 - 4.500 uIU/mL      Assessment & Plan:   Problem List Items Addressed This Visit      Other   Depression    Chronic, ongoing with insomnia.  At this time will reduce Effexor to 37.5MG  and add on Trazodone 25 MG at night + continue current Wellbutrin dose. Plan to have patient return in 2 weeks and at that time will discontinue Effexor if tolerating reduction and increase Trazodone if needed.        Relevant Medications   venlafaxine XR (EFFEXOR XR) 37.5 MG 24 hr capsule   traZODone (DESYREL) 50 MG tablet   Insomnia    Ongoing issue for years.  At this time will reduce Effexor to 37.5MG  and add on Trazodone 25 MG at night + continue current Wellbutrin dose. Plan to have patient return in 2 weeks and at that time will discontinue Effexor if tolerating reduction and increase Trazodone if needed.  Trazodone may benefit both mood and sleep  pattern.           I discussed the assessment and treatment plan with the patient. The patient was provided an opportunity to ask questions and all were answered. The patient agreed with the plan and demonstrated an understanding of the instructions.   The patient was advised to call back or seek an in-person evaluation if the symptoms worsen or if the condition fails to improve as anticipated.   I provided 15 minutes of time during this encounter.  Follow up plan: Return in about 2 weeks (around 01/17/2019) for Mood and Insomnia.

## 2019-01-17 ENCOUNTER — Other Ambulatory Visit: Payer: Self-pay

## 2019-01-17 ENCOUNTER — Ambulatory Visit (INDEPENDENT_AMBULATORY_CARE_PROVIDER_SITE_OTHER): Payer: BC Managed Care – PPO | Admitting: Nurse Practitioner

## 2019-01-17 ENCOUNTER — Encounter: Payer: Self-pay | Admitting: Nurse Practitioner

## 2019-01-17 DIAGNOSIS — F332 Major depressive disorder, recurrent severe without psychotic features: Secondary | ICD-10-CM

## 2019-01-17 DIAGNOSIS — F5101 Primary insomnia: Secondary | ICD-10-CM

## 2019-01-17 MED ORDER — VENLAFAXINE HCL ER 75 MG PO CP24: 75.0000 mg | ORAL_CAPSULE | Freq: Every day | ORAL | 3 refills | Status: DC

## 2019-01-17 NOTE — Progress Notes (Signed)
There were no vitals taken for this visit.   Subjective:    Patient ID: Alexandra Le, female    DOB: 1970-12-08, 48 y.o.   MRN: [id number]  HPI: Alexandra Le is a 48 y.o. female  Chief Complaint  Patient presents with  . Depression    2 week f/up- pt states she started back taking 75 mg of effexor  . Insomnia    2 week f/up- pt states she stopped taking the trazodone    . This visit was completed via telephone due to the restrictions of the COVID-19 pandemic. All issues as above were discussed and addressed but no physical exam was performed. If it was felt that the patient should be evaluated in the office, they were directed there. The patient verbally consented to this visit. Patient was unable to complete an audio/visual visit due to Technical difficulties,Lack of internet. Due to the catastrophic nature of the COVID-19 pandemic, this visit was done through audio contact only. . Location of the patient: work . Location of the provider: work . Those involved with this call:  . Provider: Marnee Guarneri, DNP . CMA: Yvonna Alanis, CMA . Front Desk/Registration: Don Perking  . Time spent on call: 15 minutes on the phone discussing health concerns. 10 minutes total spent in review of patient's record and preparation of their chart.  . I verified patient identity using two factors (patient name and date of birth). Patient consents verbally to being seen via telemedicine visit today.    DEPRESSION/INSOMNIA Last visit (01/03/19) we tried switching from Effexor 75 MG, reduced to 37.5MG  and added on Trazodone to benefit sleep and mood.  At this time she has stopped taking Trazodone and started back on her Effexor 75 MG daily.  She started back on Melatonin and using meditation sleep app.  States the Trazodone made her feel more snappy at people. Mood status: stable Satisfied with current treatment?: yes Symptom severity: mild  Duration of current treatment :  chronic Side effects: no Medication compliance: good compliance Psychotherapy/counseling: none Previous psychiatric medications: Trazodone, Effexor, Wellbutrin Depressed mood: no Anxious mood: no Anhedonia: no Significant weight loss or gain: no Insomnia: yes hard to fall asleep Fatigue: no Feelings of worthlessness or guilt: no Impaired concentration/indecisiveness: no Suicidal ideations: no Hopelessness: no Crying spells: no Depression screen [facility] 2/9 01/17/2019 01/03/2019 07/05/2018 12/19/2017 06/20/2017  Decreased Interest 1 0 0 0 0  Down, Depressed, Hopeless 0 0 1 0 0  PHQ - 2 Score 1 0 1 0 0  Altered sleeping 2 3 3 2 1   Tired, decreased energy 1 2 1 1 1   Change in appetite 1 1 1 1 2   Feeling bad or failure about yourself  0 0 0 0 0  Trouble concentrating 1 1 1 1  0  Moving slowly or fidgety/restless 1 1 0 0 0  Suicidal thoughts 0 0 0 0 0  PHQ-9 Score 7 8 7 5 4   Difficult doing work/chores Somewhat difficult Somewhat difficult Not difficult at all - -    Relevant past medical, surgical, family and social history reviewed and updated as indicated. Interim medical history since our last visit reviewed. Allergies and medications reviewed and updated.  Review of Systems  Constitutional: Negative for activity change, appetite change, diaphoresis, fatigue and fever.  Respiratory: Negative for cough, chest tightness and shortness of breath.   Cardiovascular: Negative for chest pain, palpitations and leg swelling.  Gastrointestinal: Negative for abdominal distention, abdominal pain, constipation, diarrhea, nausea and  vomiting.  Neurological: Negative for dizziness, syncope, weakness, light-headedness, numbness and headaches.  Psychiatric/Behavioral: Positive for sleep disturbance. Negative for decreased concentration, self-injury and suicidal ideas. The patient is not nervous/anxious.     Per HPI unless specifically indicated above     Objective:    There were no vitals taken  for this visit.  Wt Readings from Last 3 Encounters:  07/05/18 185 lb 3.2 oz (84 kg)  12/19/17 180 lb (81.6 kg)  06/20/17 181 lb 9.6 oz (82.4 kg)    Physical Exam   Unable to perform, telephone only visit.   Results for orders placed or performed in visit on 07/05/18  CBC with Differential/Platelet  Result Value Ref Range   WBC 9.0 3.4 - 10.8 x10E3/uL   RBC 4.65 3.77 - 5.28 x10E6/uL   Hemoglobin 14.6 11.1 - 15.9 g/dL   Hematocrit 42.8 34.0 - 46.6 %   MCV 92 79 - 97 fL   MCH 31.4 26.6 - 33.0 pg   MCHC 34.1 31.5 - 35.7 g/dL   RDW 12.7 11.7 - 15.4 %   Platelets 406 150 - 450 x10E3/uL   Neutrophils 71 Not Estab. %   Lymphs 19 Not Estab. %   Monocytes 6 Not Estab. %   Eos 2 Not Estab. %   Basos 1 Not Estab. %   Neutrophils Absolute 6.5 1.4 - 7.0 x10E3/uL   Lymphocytes Absolute 1.7 0.7 - 3.1 x10E3/uL   Monocytes Absolute 0.5 0.1 - 0.9 x10E3/uL   EOS (ABSOLUTE) 0.2 0.0 - 0.4 x10E3/uL   Basophils Absolute 0.1 0.0 - 0.2 x10E3/uL   Immature Granulocytes 1 Not Estab. %   Immature Grans (Abs) 0.1 0.0 - 0.1 x10E3/uL  Comprehensive metabolic panel  Result Value Ref Range   Glucose 56 (L) 65 - 99 mg/dL   BUN 12 6 - 24 mg/dL   Creatinine, Ser 0.98 0.57 - 1.00 mg/dL   GFR calc non Af Amer 68 >59 mL/min/1.73   GFR calc Af Amer 78 >59 mL/min/1.73   BUN/Creatinine Ratio 12 9 - 23   Sodium 140 134 - 144 mmol/L   Potassium 5.1 3.5 - 5.2 mmol/L   Chloride 98 96 - 106 mmol/L   CO2 24 20 - 29 mmol/L   Calcium 9.7 8.7 - 10.2 mg/dL   Total Protein 7.1 6.0 - 8.5 g/dL   Albumin 4.7 3.8 - 4.8 g/dL   Globulin, Total 2.4 1.5 - 4.5 g/dL   Albumin/Globulin Ratio 2.0 1.2 - 2.2   Bilirubin Total 0.4 0.0 - 1.2 mg/dL   Alkaline Phosphatase 93 39 - 117 IU/L   AST 24 0 - 40 IU/L   ALT 22 0 - 32 IU/L  Lipid Panel w/o Chol/HDL Ratio  Result Value Ref Range   Cholesterol, Total 207 (H) 100 - 199 mg/dL   Triglycerides 127 0 - 149 mg/dL   HDL 82 >39 mg/dL   VLDL Cholesterol Cal 25 5 - 40 mg/dL   LDL  Calculated 100 (H) 0 - 99 mg/dL  TSH  Result Value Ref Range   TSH 2.010 0.450 - 4.500 uIU/mL      Assessment & Plan:   Problem List Items Addressed This Visit      Other   Depression - Primary    Chronic, ongoing.  Will return to Effexor 75 MG and Wellbutrin 150 MG BID.  Denies SI/HI.  Return to office in 6 months.      Relevant Medications   venlafaxine XR (EFFEXOR XR) 75  MG 24 hr capsule   Insomnia    Chronic, stable with Melatonin and meditation.  Continue this regimen and sleep hygiene methods.  Return to office in 6 months.         I discussed the assessment and treatment plan with the patient. The patient was provided an opportunity to ask questions and all were answered. The patient agreed with the plan and demonstrated an understanding of the instructions.   The patient was advised to call back or seek an in-person evaluation if the symptoms worsen or if the condition fails to improve as anticipated.   I provided 15 minutes of time during this encounter.  Follow up plan: Return in about 6 months (around 07/17/2019) for Mood, HTN, Insomnia.

## 2019-01-17 NOTE — Assessment & Plan Note (Signed)
Chronic, ongoing.  Will return to Effexor 75 MG and Wellbutrin 150 MG BID.  Denies SI/HI.  Return to office in 6 months. °

## 2019-01-17 NOTE — Patient Instructions (Signed)
Living With Depression Everyone experiences occasional disappointment, sadness, and loss in their lives. When you are feeling down, blue, or sad for at least 2 weeks in a row, it may mean that you have depression. Depression can affect your thoughts and feelings, relationships, daily activities, and physical health. It is caused by changes in the way your brain functions. If you receive a diagnosis of depression, your health care provider will tell you which type of depression you have and what treatment options are available to you. If you are living with depression, there are ways to help you recover from it and also ways to prevent it from coming back. How to cope with lifestyle changes Coping with stress     Stress is your body's reaction to life changes and events, both good and bad. Stressful situations may include:  Getting married.  The death of a spouse.  Losing a job.  Retiring.  Having a baby. Stress can last just a few hours or it can be ongoing. Stress can play a major role in depression, so it is important to learn both how to cope with stress and how to think about it differently. Talk with your health care provider or a counselor if you would like to learn more about stress reduction. He or she may suggest some stress reduction techniques, such as:  Music therapy. This can include creating music or listening to music. Choose music that you enjoy and that inspires you.  Mindfulness-based meditation. This kind of meditation can be done while sitting or walking. It involves being aware of your normal breaths, rather than trying to control your breathing.  Centering prayer. This is a kind of meditation that involves focusing on a spiritual word or phrase. Choose a word, phrase, or sacred image that is meaningful to you and that brings you peace.  Deep breathing. To do this, expand your stomach and inhale slowly through your nose. Hold your breath for 3-5 seconds, then exhale  slowly, allowing your stomach muscles to relax.  Muscle relaxation. This involves intentionally tensing muscles then relaxing them. Choose a stress reduction technique that fits your lifestyle and personality. Stress reduction techniques take time and practice to develop. Set aside 5-15 minutes a day to do them. Therapists can offer training in these techniques. The training may be covered by some insurance plans. Other things you can do to manage stress include:  Keeping a stress diary. This can help you learn what triggers your stress and ways to control your response.  Understanding what your limits are and saying no to requests or events that lead to a schedule that is too full.  Thinking about how you respond to certain situations. You may not be able to control everything, but you can control how you react.  Adding humor to your life by watching funny films or TV shows.  Making time for activities that help you relax and not feeling guilty about spending your time this way.  Medicines Your health care provider may suggest certain medicines if he or she feels that they will help improve your condition. Avoid using alcohol and other substances that may prevent your medicines from working properly (may interact). It is also important to:  Talk with your pharmacist or health care provider about all the medicines that you take, their possible side effects, and what medicines are safe to take together.  Make it your goal to take part in all treatment decisions (shared decision-making). This includes giving input on   the side effects of medicines. It is best if shared decision-making with your health care provider is part of your total treatment plan. If your health care provider prescribes a medicine, you may not notice the full benefits of it for 4-8 weeks. Most people who are treated for depression need to be on medicine for at least 6-12 months after they feel better. If you are taking  medicines as part of your treatment, do not stop taking medicines without first talking to your health care provider. You may need to have the medicine slowly decreased (tapered) over time to decrease the risk of harmful side effects. Relationships Your health care provider may suggest family therapy along with individual therapy and drug therapy. While there may not be family problems that are causing you to feel depressed, it is still important to make sure your family learns as much as they can about your mental health. Having your family's support can help make your treatment successful. How to recognize changes in your condition Everyone has a different response to treatment for depression. Recovery from major depression happens when you have not had signs of major depression for two months. This may mean that you will start to:  Have more interest in doing activities.  Feel less hopeless than you did 2 months ago.  Have more energy.  Overeat less often, or have better or improving appetite.  Have better concentration. Your health care provider will work with you to decide the next steps in your recovery. It is also important to recognize when your condition is getting worse. Watch for these signs:  Having fatigue or low energy.  Eating too much or too little.  Sleeping too much or too little.  Feeling restless, agitated, or hopeless.  Having trouble concentrating or making decisions.  Having unexplained physical complaints.  Feeling irritable, angry, or aggressive. Get help as soon as you or your family members notice these symptoms coming back. How to get support and help from others How to talk with friends and family members about your condition  Talking to friends and family members about your condition can provide you with one way to get support and guidance. Reach out to trusted friends or family members, explain your symptoms to them, and let them know that you are  working with a health care provider to treat your depression. Financial resources Not all insurance plans cover mental health care, so it is important to check with your insurance carrier. If paying for co-pays or counseling services is a problem, search for a local or county mental health care center. They may be able to offer public mental health care services at low or no cost when you are not able to see a private health care provider. If you are taking medicine for depression, you may be able to get the generic form, which may be less expensive. Some makers of prescription medicines also offer help to patients who cannot afford the medicines they need. Follow these instructions at home:   Get the right amount and quality of sleep.  Cut down on using caffeine, tobacco, alcohol, and other potentially harmful substances.  Try to exercise, such as walking or lifting small weights.  Take over-the-counter and prescription medicines only as told by your health care provider.  Eat a healthy diet that includes plenty of vegetables, fruits, whole grains, low-fat dairy products, and lean protein. Do not eat a lot of foods that are high in solid fats, added sugars, or salt.    Keep all follow-up visits as told by your health care provider. This is important. Contact a health care provider if:  You stop taking your antidepressant medicines, and you have any of these symptoms: ? Nausea. ? Headache. ? Feeling lightheaded. ? Chills and body aches. ? Not being able to sleep (insomnia).  You or your friends and family think your depression is getting worse. Get help right away if:  You have thoughts of hurting yourself or others. If you ever feel like you may hurt yourself or others, or have thoughts about taking your own life, get help right away. You can go to your nearest emergency department or call:  Your local emergency services (911 in the [place]  A suicide crisis helpline, such as the  National Suicide Prevention Lifeline at 1-800-273-8255. This is open 24-hours a day. Summary  If you are living with depression, there are ways to help you recover from it and also ways to prevent it from coming back.  Work with your health care team to create a management plan that includes counseling, stress management techniques, and healthy lifestyle habits. This information is not intended to replace advice given to you by your health care provider. Make sure you discuss any questions you have with your health care provider. Document Released: 03/27/2016 Document Revised: 08/16/2018 Document Reviewed: 03/27/2016 Elsevier Patient Education  2020 [employer]  

## 2019-01-17 NOTE — Assessment & Plan Note (Signed)
Chronic, stable with Melatonin and meditation.  Continue this regimen and sleep hygiene methods.  Return to office in 6 months.

## 2019-01-30 ENCOUNTER — Other Ambulatory Visit: Payer: Self-pay | Admitting: Nurse Practitioner

## 2019-02-22 ENCOUNTER — Other Ambulatory Visit: Payer: Self-pay | Admitting: Nurse Practitioner

## 2019-03-19 ENCOUNTER — Other Ambulatory Visit: Payer: Self-pay | Admitting: Nurse Practitioner

## 2019-03-19 MED ORDER — BENAZEPRIL HCL 40 MG PO TABS: 40.0000 mg | ORAL_TABLET | Freq: Every day | ORAL | 3 refills | Status: DC

## 2019-03-19 MED ORDER — HYDROCHLOROTHIAZIDE 25 MG PO TABS: 25.0000 mg | ORAL_TABLET | Freq: Every day | ORAL | 3 refills | Status: DC

## 2019-04-10 ENCOUNTER — Encounter: Payer: Self-pay | Admitting: Nurse Practitioner

## 2019-04-10 ENCOUNTER — Other Ambulatory Visit: Payer: Self-pay

## 2019-04-10 ENCOUNTER — Ambulatory Visit (INDEPENDENT_AMBULATORY_CARE_PROVIDER_SITE_OTHER): Payer: BC Managed Care – PPO | Admitting: Nurse Practitioner

## 2019-04-10 VITALS — BP 148/101 | HR 82

## 2019-04-10 DIAGNOSIS — I10 Essential (primary) hypertension: Secondary | ICD-10-CM

## 2019-04-10 DIAGNOSIS — H9203 Otalgia, bilateral: Secondary | ICD-10-CM | POA: Diagnosis not present

## 2019-04-10 MED ORDER — AMOXICILLIN-POT CLAVULANATE 875-125 MG PO TABS: 1.0000 | ORAL_TABLET | Freq: Two times a day (BID) | ORAL | 0 refills | Status: AC

## 2019-04-10 MED ORDER — AMOXICILLIN-POT CLAVULANATE 875-125 MG PO TABS: 1.0000 | ORAL_TABLET | Freq: Two times a day (BID) | ORAL | 0 refills | Status: DC

## 2019-04-10 NOTE — Assessment & Plan Note (Signed)
Chronic, with elevations on home BP, had not been taking medication as instructed. Recommend taking Benazepril 40 MG and Hctz 25 MG daily.  Recommend checking BP at home 3 times a week.  Continue current medication regimen.  Return in 4 weeks for BP check and labs.

## 2019-04-10 NOTE — Assessment & Plan Note (Signed)
Acute, with some improvement and negative Covid testing.  Has ongoing discomfort and sinus pain.  Script for Augmentin sent and recommend regular use of Flonase at this time.  May take OTC Coricidin for symptom relief.  Increase hydration and rest.  Return for worsening or ongoing symptoms.

## 2019-04-10 NOTE — Progress Notes (Signed)
BP (!) 148/101   Pulse 82    Subjective:    Patient ID: Alexandra Le, female    DOB: 04-Jun-1970, 48 y.o.   MRN: [id number]  HPI: Alexandra Le is a 48 y.o. female  Chief Complaint  Patient presents with  . Hypertension  . Ear Pain    pt states 4 days ago, her ears and throat were hurting very bad. Negative COVID result yesterday. States her ears arent hurting as bad today     . This visit was completed via FaceTime due to the restrictions of the COVID-19 pandemic. All issues as above were discussed and addressed. Physical exam was done as above through visual confirmation on Facetime. If it was felt that the patient should be evaluated in the office, they were directed there. The patient verbally consented to this visit. . Location of the patient: home . Location of the provider: work . Those involved with this call:  . Provider: Marnee Guarneri, DNP . CMA: Yvonna Alanis, CMA . Front Desk/Registration: Jill Side  . Time spent on call: 15 minutes with patient face to face via video conference. More than 50% of this time was spent in counseling and coordination of care. 10 minutes total spent in review of patient's record and preparation of their chart.  . I verified patient identity using two factors (patient name and date of birth). Patient consents verbally to being seen via telemedicine visit today.    HYPERTENSION Was on Benazepril 40 MG and HCTZ 25 MG.  Did not realize she was to be taking both pills and was only taking HCTZ, so plans on starting to take both.  We recently increased Benazepril to 40 MG and had to separate combo pill.  Had alerted her of need to take both.  Has been exacerbated by caring for her grandchildren daily for school. Hypertension status: exacerbated Satisfied with current treatment? yes Duration of hypertension: chronic BP monitoring frequency:  daily BP range: 140-150/90-100's BP medication side effects:  no Medication  compliance: good compliance Previous BP meds: Benazepril-HCTZ Aspirin: no Recurrent headaches: no Visual changes: no Palpitations: no Dyspnea: no Chest pain: no Lower extremity edema: no Dizzy/lightheaded: no   EAR PAIN Started on Friday, tested negative for Covid at drive in clinic locally.  Continues to have ear pain bilaterally and sinus pressure/pain.  States she feels like it is improving, but not 100%.  Denies cough, sore throat, rhinorrhea.  Does have some congestion and headache + face pain. Duration: days Involved ear(s): bilateral Severity:  moderate  Quality:  dull and aching Fever: no Otorrhea: no Upper respiratory infection symptoms: yes Pruritus: no Hearing loss: no Water immersion no Using Q-tips: no Recurrent otitis media: no Status: stable Treatments attempted: OTC cold medications  Relevant past medical, surgical, family and social history reviewed and updated as indicated. Interim medical history since our last visit reviewed. Allergies and medications reviewed and updated.  Review of Systems  Constitutional: Positive for fatigue. Negative for activity change, appetite change and fever.  HENT: Positive for congestion, ear pain, sinus pressure and sinus pain. Negative for ear discharge, facial swelling, postnasal drip, rhinorrhea, sneezing, sore throat and voice change.   Eyes: Negative for pain and visual disturbance.  Respiratory: Negative for cough, chest tightness, shortness of breath and wheezing.   Cardiovascular: Negative for chest pain, palpitations and leg swelling.  Gastrointestinal: Negative for abdominal distention, abdominal pain, constipation, diarrhea, nausea and vomiting.  Musculoskeletal: Negative for myalgias.  Neurological: Positive for  headaches. Negative for dizziness and numbness.  Psychiatric/Behavioral: Negative.     Per HPI unless specifically indicated above     Objective:    BP (!) 148/101   Pulse 82   Wt Readings from  Last 3 Encounters:  07/05/18 185 lb 3.2 oz (84 kg)  12/19/17 180 lb (81.6 kg)  06/20/17 181 lb 9.6 oz (82.4 kg)    Physical Exam Vitals signs and nursing note reviewed.  Constitutional:      General: She is awake. She is not in acute distress.    Appearance: She is well-developed. She is not ill-appearing.  HENT:     Head: Normocephalic.     Right Ear: Hearing normal.     Left Ear: Hearing normal.     Nose:     Right Sinus: Frontal sinus tenderness present. No maxillary sinus tenderness.     Left Sinus: Frontal sinus tenderness present. No maxillary sinus tenderness.     Comments: Reports mild tenderness on self palpation of frontal sinus area.    Mouth/Throat:     Comments: Unable to visualize due to poor connection. Eyes:     General: Lids are normal.        Right eye: No discharge.        Left eye: No discharge.     Conjunctiva/sclera: Conjunctivae normal.  Neck:     Musculoskeletal: Normal range of motion.  Pulmonary:     Effort: Pulmonary effort is normal. No accessory muscle usage or respiratory distress.     Comments: Unable to auscultate due to virtual exam only.  No SOB or cough present. Neurological:     Mental Status: She is alert and oriented to person, place, and time.  Psychiatric:        Attention and Perception: Attention normal.        Mood and Affect: Mood normal.        Behavior: Behavior normal. Behavior is cooperative.        Thought Content: Thought content normal.        Judgment: Judgment normal.     Results for orders placed or performed in visit on 07/05/18  CBC with Differential/Platelet  Result Value Ref Range   WBC 9.0 3.4 - 10.8 x10E3/uL   RBC 4.65 3.77 - 5.28 x10E6/uL   Hemoglobin 14.6 11.1 - 15.9 g/dL   Hematocrit 42.8 34.0 - 46.6 %   MCV 92 79 - 97 fL   MCH 31.4 26.6 - 33.0 pg   MCHC 34.1 31.5 - 35.7 g/dL   RDW 12.7 11.7 - 15.4 %   Platelets 406 150 - 450 x10E3/uL   Neutrophils 71 Not Estab. %   Lymphs 19 Not Estab. %   Monocytes  6 Not Estab. %   Eos 2 Not Estab. %   Basos 1 Not Estab. %   Neutrophils Absolute 6.5 1.4 - 7.0 x10E3/uL   Lymphocytes Absolute 1.7 0.7 - 3.1 x10E3/uL   Monocytes Absolute 0.5 0.1 - 0.9 x10E3/uL   EOS (ABSOLUTE) 0.2 0.0 - 0.4 x10E3/uL   Basophils Absolute 0.1 0.0 - 0.2 x10E3/uL   Immature Granulocytes 1 Not Estab. %   Immature Grans (Abs) 0.1 0.0 - 0.1 x10E3/uL  Comprehensive metabolic panel  Result Value Ref Range   Glucose 56 (L) 65 - 99 mg/dL   BUN 12 6 - 24 mg/dL   Creatinine, Ser 0.98 0.57 - 1.00 mg/dL   GFR calc non Af Amer 68 >59 mL/min/1.73   GFR calc Af  Amer 78 >59 mL/min/1.73   BUN/Creatinine Ratio 12 9 - 23   Sodium 140 134 - 144 mmol/L   Potassium 5.1 3.5 - 5.2 mmol/L   Chloride 98 96 - 106 mmol/L   CO2 24 20 - 29 mmol/L   Calcium 9.7 8.7 - 10.2 mg/dL   Total Protein 7.1 6.0 - 8.5 g/dL   Albumin 4.7 3.8 - 4.8 g/dL   Globulin, Total 2.4 1.5 - 4.5 g/dL   Albumin/Globulin Ratio 2.0 1.2 - 2.2   Bilirubin Total 0.4 0.0 - 1.2 mg/dL   Alkaline Phosphatase 93 39 - 117 IU/L   AST 24 0 - 40 IU/L   ALT 22 0 - 32 IU/L  Lipid Panel w/o Chol/HDL Ratio  Result Value Ref Range   Cholesterol, Total 207 (H) 100 - 199 mg/dL   Triglycerides 127 0 - 149 mg/dL   HDL 82 >39 mg/dL   VLDL Cholesterol Cal 25 5 - 40 mg/dL   LDL Calculated 100 (H) 0 - 99 mg/dL  TSH  Result Value Ref Range   TSH 2.010 0.450 - 4.500 uIU/mL      Assessment & Plan:   Problem List Items Addressed This Visit      Cardiovascular and Mediastinum   Hypertension - Primary    Chronic, with elevations on home BP, had not been taking medication as instructed. Recommend taking Benazepril 40 MG and Hctz 25 MG daily.  Recommend checking BP at home 3 times a week.  Continue current medication regimen.  Return in 4 weeks for BP check and labs.        Other   Otalgia of both ears    Acute, with some improvement and negative Covid testing.  Has ongoing discomfort and sinus pain.  Script for Augmentin sent and  recommend regular use of Flonase at this time.  May take OTC Coricidin for symptom relief.  Increase hydration and rest.  Return for worsening or ongoing symptoms.         I discussed the assessment and treatment plan with the patient. The patient was provided an opportunity to ask questions and all were answered. The patient agreed with the plan and demonstrated an understanding of the instructions.   The patient was advised to call back or seek an in-person evaluation if the symptoms worsen or if the condition fails to improve as anticipated.   I provided 15 minutes of time during this encounter.  Follow up plan: Return in about 4 weeks (around 05/08/2019) for HTN follow-up.

## 2019-04-10 NOTE — Patient Instructions (Signed)

## 2019-04-14 ENCOUNTER — Encounter: Payer: Self-pay | Admitting: Nurse Practitioner

## 2019-04-14 ENCOUNTER — Telehealth: Payer: Self-pay | Admitting: Nurse Practitioner

## 2019-04-14 NOTE — Telephone Encounter (Signed)
Called Pt, no answer sent letter.

## 2019-05-10 DIAGNOSIS — Z20828 Contact with and (suspected) exposure to other viral communicable diseases: Secondary | ICD-10-CM | POA: Diagnosis not present

## 2019-06-24 ENCOUNTER — Other Ambulatory Visit: Payer: Self-pay | Admitting: Nurse Practitioner

## 2019-06-27 ENCOUNTER — Encounter: Payer: Self-pay | Admitting: Nurse Practitioner

## 2019-07-01 ENCOUNTER — Telehealth: Payer: Self-pay

## 2019-07-01 ENCOUNTER — Ambulatory Visit (INDEPENDENT_AMBULATORY_CARE_PROVIDER_SITE_OTHER): Payer: BC Managed Care – PPO | Admitting: Nurse Practitioner

## 2019-07-01 ENCOUNTER — Other Ambulatory Visit: Payer: Self-pay

## 2019-07-01 ENCOUNTER — Encounter: Payer: Self-pay | Admitting: Nurse Practitioner

## 2019-07-01 VITALS — BP 123/87 | HR 76 | Temp 98.3°F

## 2019-07-01 DIAGNOSIS — R0602 Shortness of breath: Secondary | ICD-10-CM | POA: Diagnosis not present

## 2019-07-01 DIAGNOSIS — F5101 Primary insomnia: Secondary | ICD-10-CM | POA: Diagnosis not present

## 2019-07-01 DIAGNOSIS — R002 Palpitations: Secondary | ICD-10-CM

## 2019-07-01 MED ORDER — BELSOMRA 10 MG PO TABS: 10.0000 mg | ORAL_TABLET | Freq: Every day | ORAL | 3 refills | Status: DC

## 2019-07-01 NOTE — Progress Notes (Signed)
BP 123/87 (BP Location: Left Arm, Patient Position: Sitting, Cuff Size: Normal)   Pulse 76   Temp 98.3 F (36.8 C) (Oral)   SpO2 97%    Subjective:    Patient ID: Alexandra Le, female    DOB: [birth year], 49 y.o.   MRN: [id number]  HPI: Alexandra Le is a 49 y.o. female  Chief Complaint  Patient presents with  . Follow-up    Heart Palpitations   HEART PALPITATIONS: Has underlying HTN, taking HCTZ 25 MG and Benazepril 40 MG daily.  States that when she tries to exercise begins to feel hot and out of breath, heart beating really fast.  Has had symptoms for at least 6 months.  Her mother-in-law is an NP and told her to get lungs looked at.  Has tightness around her lower rib cage when she breaths, like it will pop.  Does not notice palpitations when sitting.  Does notice this more with strenuous activity, such as sweeping the floor, and has shortness of breath.  When walking small distances, like into office, had no SOB.  Heart feels like it is beating fast.  Does endorse weight gain over past year due to being more sedentary and working at home.  Sleeps on one pillow at night, no increase.  Does not snore at night.  Has a poor sleep pattern, sometimes 1-2 hours of sleep.  Takes Melatonin, gets her to sleep and then wakes up an hour later.  Tried Trazodone, but this affected mood.  Has always been a light sleeper, but has never slept this little.    Her husband is a smoker, does not smoke in the house or car.  Is not a smoker.  She does drink alcohol a few times, a few glasses of wine or wine cooler a week.  She was adopted and is not aware of any family history.  No recent illnesses, has tested positive for Covid a few times and all negative. 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 Interim medical history since our last visit reviewed. Allergies and medications reviewed and updated.  Review of Systems  Constitutional: Positive for fatigue. Negative for activity change, appetite change, diaphoresis and fever.  Respiratory: Positive for shortness of breath (with strenuous activity). Negative for cough, chest tightness and wheezing.   Cardiovascular: Positive for palpitations and leg swelling (intermittent). Negative for chest pain.  Gastrointestinal: Negative.   Endocrine: Negative for cold intolerance, heat intolerance, polydipsia, polyphagia and polyuria.  Neurological: Negative.   Psychiatric/Behavioral: Positive for sleep disturbance. Negative for decreased concentration, self-injury and suicidal ideas. The patient is not nervous/anxious.     Per HPI unless specifically indicated above     Objective:    BP 123/87 (BP Location: Left Arm, Patient Position: Sitting, Cuff Size: Normal)   Pulse 76   Temp 98.3 F (36.8 C) (Oral)   SpO2 97%   Wt Readings from Last 3 Encounters:  07/05/18 185 lb 3.2 oz (84 kg)  12/19/17 180 lb (81.6 kg)  06/20/17 181 lb 9.6 oz (82.4 kg)    Physical Exam Vitals and nursing note reviewed.  Constitutional:  General: She is awake. She is not in acute distress.    Appearance: She is well-developed, well-groomed and overweight. She is not ill-appearing.  HENT:     Head: Normocephalic.     Right Ear: Hearing normal.     Left Ear: Hearing normal.  Eyes:     General: Lids are normal.        Right eye: No discharge.        Left eye: No discharge.     Conjunctiva/sclera: Conjunctivae normal.     Pupils: Pupils are equal, round, and reactive to light.  Neck:     Thyroid: No thyromegaly.     Vascular: No carotid  bruit.  Cardiovascular:     Rate and Rhythm: Normal rate and regular rhythm.     Heart sounds: Normal heart sounds. No murmur. No gallop.      Comments: No edema noted on exam today.   Pulmonary:     Effort: Pulmonary effort is normal. No accessory muscle usage or respiratory distress.     Breath sounds: Normal breath sounds.     Comments: Talkative without SOB Abdominal:     General: Bowel sounds are normal.     Palpations: Abdomen is soft. There is no hepatomegaly or splenomegaly.     Tenderness: There is no abdominal tenderness.  Musculoskeletal:     Cervical back: Normal range of motion and neck supple.     Right lower leg: No edema.     Left lower leg: No edema.  Lymphadenopathy:     Cervical: No cervical adenopathy.  Skin:    General: Skin is warm and dry.  Neurological:     Mental Status: She is alert and oriented to person, place, and time.     Cranial Nerves: Cranial nerves are intact.     Deep Tendon Reflexes: Reflexes are normal and symmetric.     Reflex Scores:      Brachioradialis reflexes are 2+ on the right side and 2+ on the left side.      Patellar reflexes are 2+ on the right side and 2+ on the left side. Psychiatric:        Attention and Perception: Attention normal.        Mood and Affect: Mood normal.        Speech: Speech normal.        Behavior: Behavior normal. Behavior is cooperative.    EKG performed in office, NSR rate 70 with normal axis.  Results for orders placed or performed in visit on 07/05/18  CBC with Differential/Platelet  Result Value Ref Range   WBC 9.0 3.4 - 10.8 x10E3/uL   RBC 4.65 3.77 - 5.28 x10E6/uL   Hemoglobin 14.6 11.1 - 15.9 g/dL   Hematocrit 42.8 34.0 - 46.6 %   MCV 92 79 - 97 fL   MCH 31.4 26.6 - 33.0 pg   MCHC 34.1 31.5 - 35.7 g/dL   RDW 12.7 11.7 - 15.4 %   Platelets 406 150 - 450 x10E3/uL   Neutrophils 71 Not Estab. %   Lymphs 19 Not Estab. %   Monocytes 6 Not Estab. %   Eos 2 Not Estab. %   Basos 1 Not Estab. %     Neutrophils Absolute 6.5 1.4 - 7.0 x10E3/uL   Lymphocytes Absolute 1.7 0.7 - 3.1 x10E3/uL   Monocytes Absolute 0.5 0.1 - 0.9 x10E3/uL   EOS (ABSOLUTE) 0.2 0.0 - 0.4 x10E3/uL   Basophils Absolute 0.1 0.0 - 0.2 x10E3/uL  Immature Granulocytes 1 Not Estab. %   Immature Grans (Abs) 0.1 0.0 - 0.1 x10E3/uL  Comprehensive metabolic panel  Result Value Ref Range   Glucose 56 (L) 65 - 99 mg/dL   BUN 12 6 - 24 mg/dL   Creatinine, Ser 0.98 0.57 - 1.00 mg/dL   GFR calc non Af Amer 68 >59 mL/min/1.73   GFR calc Af Amer 78 >59 mL/min/1.73   BUN/Creatinine Ratio 12 9 - 23   Sodium 140 134 - 144 mmol/L   Potassium 5.1 3.5 - 5.2 mmol/L   Chloride 98 96 - 106 mmol/L   CO2 24 20 - 29 mmol/L   Calcium 9.7 8.7 - 10.2 mg/dL   Total Protein 7.1 6.0 - 8.5 g/dL   Albumin 4.7 3.8 - 4.8 g/dL   Globulin, Total 2.4 1.5 - 4.5 g/dL   Albumin/Globulin Ratio 2.0 1.2 - 2.2   Bilirubin Total 0.4 0.0 - 1.2 mg/dL   Alkaline Phosphatase 93 39 - 117 IU/L   AST 24 0 - 40 IU/L   ALT 22 0 - 32 IU/L  Lipid Panel w/o Chol/HDL Ratio  Result Value Ref Range   Cholesterol, Total 207 (H) 100 - 199 mg/dL   Triglycerides 127 0 - 149 mg/dL   HDL 82 >39 mg/dL   VLDL Cholesterol Cal 25 5 - 40 mg/dL   LDL Calculated 100 (H) 0 - 99 mg/dL  TSH  Result Value Ref Range   TSH 2.010 0.450 - 4.500 uIU/mL      Assessment & Plan:   Problem List Items Addressed This Visit      Other   Insomnia    Chronic, ongoing, suspect this is affecting overall well-being.  Has tried Melatonin and Trazodone with no success.  Will trial Belsomra for sleep, discussed with patient and educated her on medication.  If any issues obtaining medication, will change to Seroquel.  Recommend focus on healthy sleep hygiene.  Return in 4 weeks.      Relevant Orders   VITAMIN D 25 Hydroxy (Vit-D Deficiency, Fractures)   Palpitations - Primary    Suspect multifactorial related to poor sleep pattern and deconditioning with less physical activity and  more sedentary lifestyle.  EKG NSR today.   - Will order medication for sleep, review insomnia - Labs today CBC, CMP, Vit D, TSH - For patient reassurance will place referral to cardiology for further assessment and work-up if necessary, as patient does not know family history and has concerns in regard to this - Recommend increased hydration at home and avoidance of caffeine drinks + focus on DASH diet      Relevant Orders   Comprehensive metabolic panel   CBC with Differential/Platelet   Lipid Panel w/o Chol/HDL Ratio   DG Chest 2 View   TSH   Shortness of breath    Suspect multifactorial related to poor sleep pattern and deconditioning with less physical activity and more sedentary lifestyle.  EKG NSR today.   - Obtain labs today - CXR ordered to assess heart and lungs - Referral to cardiology for patient reassurance      Relevant Orders   Comprehensive metabolic panel   CBC with Differential/Platelet   VITAMIN D 25 Hydroxy (Vit-D Deficiency, Fractures)   DG Chest 2 View   TSH      Controlled substance.  Checked data base and no other refills of controlled substances noted.  Last Tramadol refill on 10/20/2018 for #30 tablets, minimal use.  Time: 25+ minutes, >50%  spent counseling on sleep hygiene and heart health   Follow up plan: Return in about 4 weeks (around 07/29/2019) for Palpitations and Insomnia.

## 2019-07-01 NOTE — Telephone Encounter (Signed)
Prior Authorization initiated via CoverMyMeds for Belsomra Key: [employer]

## 2019-07-01 NOTE — Assessment & Plan Note (Signed)
Suspect multifactorial related to poor sleep pattern and deconditioning with less physical activity and more sedentary lifestyle.  EKG NSR today.   - Obtain labs today - CXR ordered to assess heart and lungs - Referral to cardiology for patient reassurance

## 2019-07-01 NOTE — Assessment & Plan Note (Signed)
Chronic, ongoing, suspect this is affecting overall well-being.  Has tried Melatonin and Trazodone with no success.  Will trial Belsomra for sleep, discussed with patient and educated her on medication.  If any issues obtaining medication, will change to Seroquel.  Recommend focus on healthy sleep hygiene.  Return in 4 weeks.

## 2019-07-01 NOTE — Patient Instructions (Signed)
Suvorexant oral tablets What is this medicine? SUVOREXANT (su-vor-EX-ant) is used to treat insomnia. This medicine helps you to fall asleep and sleep through the night. This medicine may be used for other purposes; ask your health care provider or pharmacist if you have questions. COMMON BRAND NAME(S): Belsomra What should I tell my health care provider before I take this medicine? They need to know if you have any of these conditions:  depression  drink alcohol  drug abuse or addiction  feel sleepy or have fallen asleep suddenly during the day  history of a sudden onset of muscle weakness (cataplexy)  liver disease  lung or breathing disease, like asthma or emphysema  sleep apnea  suicidal thoughts, plans, or attempt; a previous suicide attempt by you or a family member  an unusual or allergic reaction to suvorexant, other medicines, foods, dyes, or preservatives  pregnant or trying to get pregnant  breast-feeding How should I use this medicine? Take this medicine by mouth within 30 minutes of going to bed. Do not take it unless you are able to stay in bed a full night before you must be active again. Follow the directions on the prescription label. You may take this medicine with or without a food. However, this medicine may take longer to work if you take it with or right after meals. Do not take your medicine more often than directed. Do not stop taking this medicine on your own. Always follow your doctor or health care professional's advice. A special MedGuide will be given to you by the pharmacist with each prescription and refill. Be sure to read this information carefully each time. Talk to your pediatrician regarding the use of this medicine in children. Special care may be needed. Overdosage: If you think you have taken too much of this medicine contact a poison control center or emergency room at once. NOTE: This medicine is only for you. Do not share this medicine with  others. What if I miss a dose? This medicine should only be taken immediately before going to sleep. Do not take double or extra doses. What may interact with this medicine?  alcohol  antihistamines for allergy, cough, or cold  aprepitant  boceprevir  certain antibiotics like ciprofloxacin, clarithromycin, erythromycin, telithromycin  certain antivirals for HIV or AIDS  certain medicines for anxiety or sleep  certain medicines for depression like amitriptyline, fluoxetine, nefazodone, sertraline  certain medicines for fungal infections like ketoconazole, posaconazole, fluconazole, itraconazole  certain medicines for seizures like carbamazepine, phenobarbital, primidone, phenytoin  conivaptan  digoxin  diltiazem  general anesthetics like halothane, isoflurane, methoxyflurane, propofol  grapefruit juice  imatinib  medicines that relax muscles for surgery  narcotic medicines for pain  phenothiazines like chlorpromazine, mesoridazine, prochlorperazine, thioridazine  rifampin  verapamil This list may not describe all possible interactions. Give your health care provider a list of all the medicines, herbs, non-prescription drugs, or dietary supplements you use. Also tell them if you smoke, drink alcohol, or use illegal drugs. Some items may interact with your medicine. What should I watch for while using this medicine? Visit your health care professional for regular checks on your progress. Tell your health care professional if your symptoms do not start to get better or if they get worse. Avoid caffeine-containing drinks in the evening hours. After taking this medicine, you may get up out of bed and do an activity that you do not know you are doing. The next morning, you may have no memory of this.  Activities include driving a car ("sleep-driving"), making and eating food, talking on the phone, sexual activity, and sleep-walking. Serious injuries have occurred. Call your  doctor right away if you find out you have done any of these activities. Do not take this medicine if you have used alcohol that evening. Do not take it if you have taken another medicine for sleep. °Do not take this medicine unless you are able to stay in bed for a full night (7 to 8 hours) and do not drive or perform other activities requiring full alertness within 8 hours of a dose. Do not drive, use machinery, or do anything that needs mental alertness the day after you take the 20 mg dose of this medicine. The use of lower doses (10 mg) may also cause driving impairment the next day. You may have a decrease in mental alertness the day after use, even if you feel that you are fully awake. Tell your doctor if you will need to perform activities requiring full alertness, such as driving, the next day. Do not stand or sit up quickly after taking this medicine, especially if you are an older patient. This reduces the risk of dizzy or fainting spells. °If you or your family notice any changes in your behavior, such as new or worsening depression, thoughts of harming yourself, anxiety, other unusual or disturbing thoughts, or memory loss, call your health care professional right away. °After you stop taking this medicine, you may have trouble falling asleep. This is called rebound insomnia. This problem usually goes away on its own after 1 or 2 nights. °What side effects may I notice from receiving this medicine? °Side effects that you should report to your doctor or health care professional as soon as possible: °· allergic reactions like skin rash, itching or hives, swelling of the face, lips, or tongue °· hallucinations °· periods of leg weakness lasting from seconds to a few minutes °· suicidal thoughts, mood changes °· unable to move or speak for several minutes while going to sleep or waking up °· unusual activities while not fully awake like driving, eating, making phone calls, or sexual activity °Side effects  that usually do not require medical attention (report these to your doctor or health care professional if they continue or are bothersome): °· daytime drowsiness °· headache °· nightmares or abnormal dreams °· tiredness °This list may not describe all possible side effects. Call your doctor for medical advice about side effects. You may report side effects to FDA at 1-800-FDA-1088. °Where should I keep my medicine? °Keep out of the reach of children. This medicine can be abused. Keep your medicine in a safe place to protect it from theft. Do not share this medicine with anyone. Selling or giving away this medicine is dangerous and against the law. °Store at room temperature between 15 and 30 degrees C (59 and 86 degrees F). Throw away any unused medicine after the expiration date. °NOTE: This sheet is a summary. It may not cover all possible information. If you have questions about this medicine, talk to your doctor, pharmacist, or health care provider. °© 2020 [employer]vier/Gold Standard (2018-05-10 16:37:12) ° °

## 2019-07-01 NOTE — Assessment & Plan Note (Signed)
Suspect multifactorial related to poor sleep pattern and deconditioning with less physical activity and more sedentary lifestyle.  EKG NSR today.   - Will order medication for sleep, review insomnia - Labs today CBC, CMP, Vit D, TSH - For patient reassurance will place referral to cardiology for further assessment and work-up if necessary, as patient does not know family history and has concerns in regard to this - Recommend increased hydration at home and avoidance of caffeine drinks + focus on DASH diet

## 2019-07-02 ENCOUNTER — Ambulatory Visit
Admission: RE | Admit: 2019-07-02 | Discharge: 2019-07-02 | Disposition: A | Discharge status: Home / Self Care | Payer: BC Managed Care – PPO | Attending: Nurse Practitioner | Admitting: Nurse Practitioner

## 2019-07-02 ENCOUNTER — Ambulatory Visit
Admission: RE | Admit: 2019-07-02 | Discharge: 2019-07-02 | Disposition: A | Discharge status: Home / Self Care | Payer: BC Managed Care – PPO | Source: Ambulatory Visit | Attending: Nurse Practitioner | Admitting: Nurse Practitioner

## 2019-07-02 DIAGNOSIS — R002 Palpitations: Secondary | ICD-10-CM | POA: Insufficient documentation

## 2019-07-02 DIAGNOSIS — R0789 Other chest pain: Secondary | ICD-10-CM | POA: Diagnosis not present

## 2019-07-02 DIAGNOSIS — R0602 Shortness of breath: Secondary | ICD-10-CM

## 2019-07-02 LAB — CBC WITH DIFFERENTIAL/PLATELET
Basophils Absolute: 0.1 10*3/uL (ref 0.0–0.2)
Basos: 1 %
EOS (ABSOLUTE): 0.2 10*3/uL (ref 0.0–0.4)
Eos: 3 %
Hematocrit: 41.4 % (ref 34.0–46.6)
Hemoglobin: 13.9 g/dL (ref 11.1–15.9)
Immature Grans (Abs): 0 10*3/uL (ref 0.0–0.1)
Immature Granulocytes: 1 %
Lymphocytes Absolute: 1.9 10*3/uL (ref 0.7–3.1)
Lymphs: 26 %
MCH: 30.4 pg (ref 26.6–33.0)
MCHC: 33.6 g/dL (ref 31.5–35.7)
MCV: 91 fL (ref 79–97)
Monocytes Absolute: 0.5 10*3/uL (ref 0.1–0.9)
Monocytes: 7 %
Neutrophils Absolute: 4.7 10*3/uL (ref 1.4–7.0)
Neutrophils: 62 %
Platelets: 323 10*3/uL (ref 150–450)
RBC: 4.57 x10E6/uL (ref 3.77–5.28)
RDW: 12.7 % (ref 11.7–15.4)
WBC: 7.4 10*3/uL (ref 3.4–10.8)

## 2019-07-02 LAB — COMPREHENSIVE METABOLIC PANEL
ALT: 88 IU/L — ABNORMAL HIGH (ref 0–32)
AST: 86 IU/L — ABNORMAL HIGH (ref 0–40)
Albumin/Globulin Ratio: 1.6 (ref 1.2–2.2)
Albumin: 4.5 g/dL (ref 3.8–4.8)
Alkaline Phosphatase: 122 IU/L — ABNORMAL HIGH (ref 39–117)
BUN/Creatinine Ratio: 15 (ref 9–23)
BUN: 12 mg/dL (ref 6–24)
Bilirubin Total: 0.6 mg/dL (ref 0.0–1.2)
CO2: 21 mmol/L (ref 20–29)
Calcium: 9.4 mg/dL (ref 8.7–10.2)
Chloride: 99 mmol/L (ref 96–106)
Creatinine, Ser: 0.79 mg/dL (ref 0.57–1.00)
GFR calc Af Amer: 101 mL/min/{1.73_m2} (ref >59)
GFR calc non Af Amer: 88 mL/min/{1.73_m2} (ref >59)
Globulin, Total: 2.8 g/dL (ref 1.5–4.5)
Glucose: 98 mg/dL (ref 65–99)
Potassium: 3.9 mmol/L (ref 3.5–5.2)
Sodium: 138 mmol/L (ref 134–144)
Total Protein: 7.3 g/dL (ref 6.0–8.5)

## 2019-07-02 LAB — LIPID PANEL W/O CHOL/HDL RATIO
Cholesterol, Total: 220 mg/dL — ABNORMAL HIGH (ref 100–199)
HDL: 84 mg/dL (ref >39)
LDL Chol Calc (NIH): 117 mg/dL — ABNORMAL HIGH (ref 0–99)
Triglycerides: 108 mg/dL (ref 0–149)
VLDL Cholesterol Cal: 19 mg/dL (ref 5–40)

## 2019-07-02 LAB — TSH: TSH: 1.51 u[IU]/mL (ref 0.450–4.500)

## 2019-07-02 LAB — VITAMIN D 25 HYDROXY (VIT D DEFICIENCY, FRACTURES): Vit D, 25-Hydroxy: 34.9 ng/mL (ref 30.0–100.0)

## 2019-07-02 NOTE — Progress Notes (Signed)
Contacted via MyChart  The 10-year ASCVD risk score Mikey Bussing DC Jr., et al., 2013) is: 1%   Values used to calculate the score:     Age: 49 years     Sex: Female     Is Non-Hispanic African American: No     Diabetic: No     Tobacco smoker: No     Systolic Blood Pressure: [id number] mmHg     Is BP treated: Yes     HDL Cholesterol: 84 mg/dL     Total Cholesterol: 220 mg/dL

## 2019-07-03 NOTE — Progress Notes (Signed)
Contacted via MyChart

## 2019-07-04 ENCOUNTER — Ambulatory Visit (INDEPENDENT_AMBULATORY_CARE_PROVIDER_SITE_OTHER): Payer: BC Managed Care – PPO | Admitting: Cardiology

## 2019-07-04 ENCOUNTER — Encounter: Payer: Self-pay | Admitting: Cardiology

## 2019-07-04 ENCOUNTER — Other Ambulatory Visit: Payer: Self-pay

## 2019-07-04 ENCOUNTER — Ambulatory Visit (INDEPENDENT_AMBULATORY_CARE_PROVIDER_SITE_OTHER): Payer: BC Managed Care – PPO

## 2019-07-04 VITALS — BP 124/82 | HR 77 | Ht 68.0 in | Wt 191.0 lb

## 2019-07-04 DIAGNOSIS — R002 Palpitations: Secondary | ICD-10-CM

## 2019-07-04 DIAGNOSIS — I10 Essential (primary) hypertension: Secondary | ICD-10-CM

## 2019-07-04 NOTE — Progress Notes (Signed)
Cardiology Office Note:    Date:  07/04/2019   ID:  Alexandra Le, DOB Oct 05, 1970, MRN [id number]  PCP:  Venita Lick, NP  Cardiologist:  Kate Sable, MD  Electrophysiologist:  None   Referring MD: Venita Lick, NP   Chief Complaint  Patient presents with  . New Patient (Initial Visit)    Referred by PCP for Palpitations. Meds reviewed verbally with patient.    Alexandra Le is a 49 y.o. female who is being seen today for the evaluation of palpitations at the request of Venita Lick, NP.  History of Present Illness:    Alexandra Le is a 49 y.o. female with a hx of hypertension who presents due to palpitations.  Patient states having worsening symptoms of increased heart rates for over 6 months now.  Symptoms typically occur about 2 times a week and lasts 2 to 3 minutes.  Symptoms usually related with exertion such as mopping the floor, sweeping.  She typically sits down for a couple of minutes for symptoms to improve so she can resume her duties.  Symptoms are sometimes associated with a flushing feeling.  She denies chest pain, shortness of breath, edema, orthopnea, dizziness, presyncope or syncope.  Past Medical History:  Diagnosis Date  . Anemia    history of anemai due to AUB  . Anxiety   . Chest pain of unknown etiology 2015   was seen in [place], [place], [place]- not dx'd and no follow up  . Depression   . Hypertension   . PONV (postoperative nausea and vomiting)     Past Surgical History:  Procedure Laterality Date  . BIOPSY N/A 07/21/2016   Procedure: BIOPSY;  Surgeon: Lucilla Lame, MD;  Location: [facility];  Service: Endoscopy;  Laterality: N/A;  sigmoid polyps x2  . CERVICAL CONIZATION W/BX N/A 04/30/2015   Procedure: CONIZATION CERVIX WITH BIOPSY;  Surgeon: Christophe Louis, MD;  Location: [facility];  Service: Gynecology;  Laterality: N/A;  . COLONOSCOPY WITH PROPOFOL N/A 07/21/2016   Procedure: COLONOSCOPY WITH PROPOFOL;   Surgeon: Lucilla Lame, MD;  Location: [facility];  Service: Endoscopy;  Laterality: N/A;  . pyloric stenosis    . TUBAL LIGATION      Current Medications: Current Meds  Medication Sig  . benazepril (LOTENSIN) 40 MG tablet Take 1 tablet (40 mg total) by mouth daily.  . Cholecalciferol (VITAMIN D PO) Take by mouth daily.  . hydrochlorothiazide (HYDRODIURIL) 25 MG tablet Take 1 tablet (25 mg total) by mouth daily.  . Multiple Vitamin (MULTIVITAMIN) tablet Take 1 tablet by mouth daily.  . Suvorexant (BELSOMRA) 10 MG TABS Take 10 mg by mouth at bedtime.  . traMADol (ULTRAM) 50 MG tablet Take 1 tablet (50 mg total) by mouth every 6 (six) hours as needed.  . venlafaxine XR (EFFEXOR XR) 75 MG 24 hr capsule Take 1 capsule (75 mg total) by mouth daily with breakfast.     Allergies:   Patient has no known allergies.   Social History   Socioeconomic History  . Marital status: Married    Spouse name: Not on file  . Number of children: Not on file  . Years of education: Not on file  . Highest education level: Not on file  Occupational History  . Not on file  Tobacco Use  . Smoking status: Never Smoker  . Smokeless tobacco: Never Used  Substance and Sexual Activity  . Alcohol use: Yes    Alcohol/week: 1.0  standard drinks    Types: 1 Glasses of wine per week    Comment: occasional  . Drug use: No  . Sexual activity: Yes  Other Topics Concern  . Not on file  Social History Narrative  . Not on file   Social Determinants of Health   Financial Resource Strain:   . Difficulty of Paying Living Expenses: Not on file  Food Insecurity:   . Worried About Charity fundraiser in the Last Year: Not on file  . Ran Out of Food in the Last Year: Not on file  Transportation Needs:   . Lack of Transportation (Medical): Not on file  . Lack of Transportation (Non-Medical): Not on file  Physical Activity:   . Days of Exercise per Week: Not on file  . Minutes of Exercise per Session: Not on  file  Stress:   . Feeling of Stress : Not on file  Social Connections:   . Frequency of Communication with Friends and Family: Not on file  . Frequency of Social Gatherings with Friends and Family: Not on file  . Attends Religious Services: Not on file  . Active Member of Clubs or Organizations: Not on file  . Attends Archivist Meetings: Not on file  . Marital Status: Not on file     Family History: The patient's family history is negative for Breast cancer. She was adopted.  ROS:   Please see the history of present illness.     All other systems reviewed and are negative.  EKGs/Labs/Other Studies Reviewed:    The following studies were reviewed today:   EKG:  EKG is  ordered today.  The ekg ordered today demonstrates normal sinus rhythm, normal ECG.  Recent Labs: 07/01/2019: ALT 88; BUN 12; Creatinine, Ser 0.79; Hemoglobin 13.9; Platelets 323; Potassium 3.9; Sodium 138; TSH 1.510  Recent Lipid Panel    Component Value Date/Time   CHOL 220 (H) 07/01/2019 1156   TRIG 108 07/01/2019 1156   HDL 84 07/01/2019 1156   LDLCALC 117 (H) 07/01/2019 1156    Physical Exam:    VS:  BP 124/82 (BP Location: Right Arm, Patient Position: Sitting, Cuff Size: Normal)   Pulse 77   Ht 5\' 8"  (1.727 m)   Wt 191 lb (86.6 kg)   SpO2 98%   BMI 29.04 kg/m     Wt Readings from Last 3 Encounters:  07/04/19 191 lb (86.6 kg)  07/05/18 185 lb 3.2 oz (84 kg)  12/19/17 180 lb (81.6 kg)     GEN:  Well nourished, well developed in no acute distress HEENT: Normal NECK: No JVD; No carotid bruits LYMPHATICS: No lymphadenopathy CARDIAC: RRR, no murmurs, rubs, gallops RESPIRATORY:  Clear to auscultation without rales, wheezing or rhonchi  ABDOMEN: Soft, non-tender, non-distended MUSCULOSKELETAL:  No edema; No deformity  SKIN: Warm and dry NEUROLOGIC:  Alert and oriented x 3 PSYCHIATRIC:  Normal affect   ASSESSMENT:    1. Palpitations   2. Essential hypertension    PLAN:    In  order of problems listed above:  1. Patient with a 62-month history of worsening palpitations, happening 2-3 times a week, lasting 2 to 3 minutes.  Will evaluate for arrhythmias with a cardiac monitor x2 weeks. 2. Her blood pressure is adequately controlled.  Continue current BP meds.  We will up in 1 month after cardiac monitor.  This note was generated in part or whole with voice recognition software. Voice recognition is usually quite accurate but  there are transcription errors that can and very often do occur. I apologize for any typographical errors that were not detected and corrected.  Medication Adjustments/Labs and Tests Ordered: Current medicines are reviewed at length with the patient today.  Concerns regarding medicines are outlined above.  Orders Placed This Encounter  Procedures  . LONG TERM MONITOR (3-14 DAYS)  . EKG 12-Lead   No orders of the defined types were placed in this encounter.   Patient Instructions  Medication Instructions:  Your physician recommends that you continue on your current medications as directed. Please refer to the Current Medication list given to you today.  *If you need a refill on your cardiac medications before your next appointment, please call your pharmacy*   Lab Work: none If you have labs (blood work) drawn today and your tests are completely normal, you will receive your results only by: Marland Kitchen MyChart Message (if you have MyChart) OR . A paper copy in the mail If you have any lab test that is abnormal or we need to change your treatment, we will call you to review the results.   Testing/Procedures: 14 day ZIO - Your physician has recommended that you wear a Zio monitor. This monitor is a medical device that records the heart's electrical activity. Doctors most often use these monitors to diagnose arrhythmias. Arrhythmias are problems with the speed or rhythm of the heartbeat. The monitor is a small device applied to your chest. You can  wear one while you do your normal daily activities. While wearing this monitor if you have any symptoms to push the button and record what you felt. Once you have worn this monitor for the period of time provider prescribed (Usually 14 days), you will return the monitor device in the postage paid box. Once it is returned they will download the data collected and provide [place] with a report which the provider will then review and we will call you with those results. Important tips:  1. Avoid showering during the first 24 hours of wearing the monitor. 2. Avoid excessive sweating to help maximize wear time. 3. Do not submerge the device, no hot tubs, and no swimming pools. 4. Keep any lotions or oils away from the patch. 5. After 24 hours you may shower with the patch on. Take brief showers with your back facing the shower head.  6. Do not remove patch once it has been placed because that will interrupt data and decrease adhesive wear time. 7. Push the button when you have any symptoms and write down what you were feeling. 8. Once you have completed wearing your monitor, remove and place into box which has postage paid and place in your outgoing mailbox.  9. If for some reason you have misplaced your box then call our office and we can provide another box and/or mail it off for you.  Follow-Up: At [facility], you and your health needs are our priority.  As part of our continuing mission to provide you with exceptional heart care, we have created designated Provider Care Teams.  These Care Teams include your primary Cardiologist (physician) and Advanced Practice Providers (APPs -  Physician Assistants and Nurse Practitioners) who all work together to provide you with the care you need, when you need it.  We recommend signing up for the patient portal called "MyChart".  Sign up information is provided on this After Visit Summary.  MyChart is used to connect with patients for Virtual Visits (Telemedicine).   Patients  are able to view lab/test results, encounter notes, upcoming appointments, etc.  Non-urgent messages can be sent to your provider as well.   To learn more about what you can do with MyChart, go to [email or web address].    Your next appointment:   1 month(s)  The format for your next appointment:   In Person  Provider:   Kate Sable, MD     Signed, Kate Sable, MD  07/04/2019 10:05 AM    [place]

## 2019-07-04 NOTE — Patient Instructions (Signed)
Medication Instructions:  Your physician recommends that you continue on your current medications as directed. Please refer to the Current Medication list given to you today.  *If you need a refill on your cardiac medications before your next appointment, please call your pharmacy*   Lab Work: none If you have labs (blood work) drawn today and your tests are completely normal, you will receive your results only by: Marland Kitchen MyChart Message (if you have MyChart) OR . A paper copy in the mail If you have any lab test that is abnormal or we need to change your treatment, we will call you to review the results.   Testing/Procedures: 14 day ZIO - Your physician has recommended that you wear a Zio monitor. This monitor is a medical device that records the heart's electrical activity. Doctors most often use these monitors to diagnose arrhythmias. Arrhythmias are problems with the speed or rhythm of the heartbeat. The monitor is a small device applied to your chest. You can wear one while you do your normal daily activities. While wearing this monitor if you have any symptoms to push the button and record what you felt. Once you have worn this monitor for the period of time provider prescribed (Usually 14 days), you will return the monitor device in the postage paid box. Once it is returned they will download the data collected and provide [place] with a report which the provider will then review and we will call you with those results. Important tips:  1. Avoid showering during the first 24 hours of wearing the monitor. 2. Avoid excessive sweating to help maximize wear time. 3. Do not submerge the device, no hot tubs, and no swimming pools. 4. Keep any lotions or oils away from the patch. 5. After 24 hours you may shower with the patch on. Take brief showers with your back facing the shower head.  6. Do not remove patch once it has been placed because that will interrupt data and decrease adhesive wear  time. 7. Push the button when you have any symptoms and write down what you were feeling. 8. Once you have completed wearing your monitor, remove and place into box which has postage paid and place in your outgoing mailbox.  9. If for some reason you have misplaced your box then call our office and we can provide another box and/or mail it off for you.  Follow-Up: At [facility], you and your health needs are our priority.  As part of our continuing mission to provide you with exceptional heart care, we have created designated Provider Care Teams.  These Care Teams include your primary Cardiologist (physician) and Advanced Practice Providers (APPs -  Physician Assistants and Nurse Practitioners) who all work together to provide you with the care you need, when you need it.  We recommend signing up for the patient portal called "MyChart".  Sign up information is provided on this After Visit Summary.  MyChart is used to connect with patients for Virtual Visits (Telemedicine).  Patients are able to view lab/test results, encounter notes, upcoming appointments, etc.  Non-urgent messages can be sent to your provider as well.   To learn more about what you can do with MyChart, go to [email or web address].    Your next appointment:   1 month(s)  The format for your next appointment:   In Person  Provider:   Kate Sable, MD

## 2019-07-10 ENCOUNTER — Encounter: Payer: Self-pay | Admitting: Nurse Practitioner

## 2019-07-10 ENCOUNTER — Other Ambulatory Visit: Payer: Self-pay | Admitting: Nurse Practitioner

## 2019-07-10 MED ORDER — ESZOPICLONE 1 MG PO TABS: 1.0000 mg | ORAL_TABLET | Freq: Every evening | ORAL | 1 refills | Status: DC | PRN

## 2019-07-10 NOTE — Telephone Encounter (Signed)
PA denied. We should receive a fax as to reasoning.

## 2019-07-18 ENCOUNTER — Ambulatory Visit: Payer: Self-pay | Admitting: Nurse Practitioner

## 2019-07-22 ENCOUNTER — Encounter: Payer: Self-pay | Admitting: Nurse Practitioner

## 2019-07-25 ENCOUNTER — Other Ambulatory Visit: Payer: Self-pay

## 2019-07-25 ENCOUNTER — Encounter: Payer: BC Managed Care – PPO | Admitting: Cardiology

## 2019-07-25 ENCOUNTER — Encounter: Payer: Self-pay | Admitting: Cardiology

## 2019-07-26 NOTE — Progress Notes (Signed)
This encounter was created in error - please disregard.

## 2019-07-29 ENCOUNTER — Ambulatory Visit (INDEPENDENT_AMBULATORY_CARE_PROVIDER_SITE_OTHER): Payer: BC Managed Care – PPO | Admitting: Nurse Practitioner

## 2019-07-29 ENCOUNTER — Encounter: Payer: Self-pay | Admitting: Nurse Practitioner

## 2019-07-29 ENCOUNTER — Telehealth: Payer: Self-pay

## 2019-07-29 ENCOUNTER — Other Ambulatory Visit: Payer: Self-pay

## 2019-07-29 VITALS — BP 132/80 | HR 67 | Temp 98.3°F | Ht 68.0 in | Wt 193.0 lb

## 2019-07-29 DIAGNOSIS — R7989 Other specified abnormal findings of blood chemistry: Secondary | ICD-10-CM

## 2019-07-29 DIAGNOSIS — F5101 Primary insomnia: Secondary | ICD-10-CM

## 2019-07-29 DIAGNOSIS — R002 Palpitations: Secondary | ICD-10-CM

## 2019-07-29 DIAGNOSIS — I10 Essential (primary) hypertension: Secondary | ICD-10-CM | POA: Diagnosis not present

## 2019-07-29 MED ORDER — BELSOMRA 10 MG PO TABS: 10.0000 mg | ORAL_TABLET | Freq: Every day | ORAL | 2 refills | Status: DC

## 2019-07-29 NOTE — Telephone Encounter (Signed)
Tried to PA for patient for Belsomra.  I can't do a PA because I already did a PA for Belsomra this year, and it won't let me do another one because I've done one for the year 2021. We will have to appeal the initial PA.  Provide letter that patient has tried and failed Trazadone, Lunesta, and another medications and that this medication is medical necessary.

## 2019-07-29 NOTE — Assessment & Plan Note (Signed)
New, noted on recent labs.  Recheck LFT and add on GGT today.  If continued elevation obtain u/s RUQ.  Discussed with patient and provided education.

## 2019-07-29 NOTE — Telephone Encounter (Signed)
Can we place appeal then.  Thank you.

## 2019-07-29 NOTE — Progress Notes (Signed)
BP 132/80 (BP Location: Left Arm, Patient Position: Sitting)   Pulse 67   Temp 98.3 F (36.8 C) (Oral)   Ht '5\' 8"'  (1.727 m)   Wt 193 lb (87.5 kg)   SpO2 100%   BMI 29.35 kg/m    Subjective:    Patient ID: Alexandra Le, female    DOB: Sep 25, 1970, 49 y.o.   MRN: [id number]  HPI: Alexandra Le is a 49 y.o. female  Chief Complaint  Patient presents with  . Palpitations  . Insomnia   HEART PALPITATIONS: Has underlying HTN, taking HCTZ 25 MG and Benazepril 40 MG daily.  Last visit referral placed to cardiology for further evaluation, visited with Dr. Garen Lah on 07/04/2019 and had Zio performed, has follow-up to discuss on April 2nd for the results.  No medication changes made by cardiology.  At this time reports no palpitations when wearing monitor, but has had one episode since monitor has been off.  These events have improved, feels her sleep habit improving has helped this.  Did have elevation in LFT and Alk Phos noted on recent labs, AST/ALT 86/88 amd Alk Phos 122, previous had been 24/22 with alp phos 93 in February 2020.  She reports drinking occasional wine only on weekends and no Tylenol use at home.  Is on Effexor for mood. Aspirin: no Recurrent headaches: yes -- occasional Visual changes: no -- recently went to eye doctor Palpitations: yes, improving Dyspnea: no Chest pain: no Lower extremity edema: none Dizzy/lightheaded: occasional -- notices it more with palpitations  INSOMNIA Has tried Trazodone and Melatonin with no benefit, last visit added on Lunesta.  She reports minimal benefit from this.  Has been working on sleep habits and reports some improvement. Duration: chronic Satisfied with sleep quality: yes Difficulty falling asleep: yes Difficulty staying asleep: yes Waking a few hours after sleep onset: no Early morning awakenings: no Daytime hypersomnolence: no Wakes feeling refreshed: yes Good sleep hygiene: yes Apnea: no Snoring:  no Depressed/anxious mood: no Recent stress: no Restless legs/nocturnal leg cramps: no Chronic pain/arthritis: no History of sleep study: no Treatments attempted: melatonin, Lunesta, Trazodone   Relevant past medical, surgical, family and social history reviewed and updated as indicated. Interim medical history since our last visit reviewed. Allergies and medications reviewed and updated.  Review of Systems  Constitutional: Positive for fatigue. Negative for activity change, appetite change, diaphoresis and fever.  Respiratory: Negative for cough, chest tightness, shortness of breath and wheezing.   Cardiovascular: Positive for palpitations. Negative for chest pain and leg swelling.  Gastrointestinal: Negative.   Endocrine: Negative for cold intolerance, heat intolerance, polydipsia, polyphagia and polyuria.  Neurological: Negative.   Psychiatric/Behavioral: Positive for sleep disturbance. Negative for decreased concentration, self-injury and suicidal ideas. The patient is not nervous/anxious.     Per HPI unless specifically indicated above     Objective:    BP 132/80 (BP Location: Left Arm, Patient Position: Sitting)   Pulse 67   Temp 98.3 F (36.8 C) (Oral)   Ht '5\' 8"'  (1.727 m)   Wt 193 lb (87.5 kg)   SpO2 100%   BMI 29.35 kg/m   Wt Readings from Last 3 Encounters:  07/29/19 193 lb (87.5 kg)  07/25/19 191 lb 4 oz (86.8 kg)  07/04/19 191 lb (86.6 kg)    Physical Exam Vitals and nursing note reviewed.  Constitutional:      General: She is awake. She is not in acute distress.    Appearance: She is well-developed,  well-groomed and overweight. She is not ill-appearing.  HENT:     Head: Normocephalic.     Right Ear: Hearing normal.     Left Ear: Hearing normal.  Eyes:     General: Lids are normal.        Right eye: No discharge.        Left eye: No discharge.     Conjunctiva/sclera: Conjunctivae normal.     Pupils: Pupils are equal, round, and reactive to light.   Neck:     Thyroid: No thyromegaly.     Vascular: No carotid bruit.  Cardiovascular:     Rate and Rhythm: Normal rate and regular rhythm.     Heart sounds: Normal heart sounds. No murmur. No gallop.      Comments: No edema noted on exam today.   Pulmonary:     Effort: Pulmonary effort is normal. No accessory muscle usage or respiratory distress.     Breath sounds: Normal breath sounds.     Comments: Talkative without SOB Abdominal:     General: Bowel sounds are normal.     Palpations: Abdomen is soft. There is no hepatomegaly or splenomegaly.     Tenderness: There is no abdominal tenderness.  Musculoskeletal:     Cervical back: Normal range of motion and neck supple.     Right lower leg: No edema.     Left lower leg: No edema.  Lymphadenopathy:     Cervical: No cervical adenopathy.  Skin:    General: Skin is warm and dry.  Neurological:     Mental Status: She is alert and oriented to person, place, and time.     Cranial Nerves: Cranial nerves are intact.     Deep Tendon Reflexes: Reflexes are normal and symmetric.     Reflex Scores:      Brachioradialis reflexes are 2+ on the right side and 2+ on the left side.      Patellar reflexes are 2+ on the right side and 2+ on the left side. Psychiatric:        Attention and Perception: Attention normal.        Mood and Affect: Mood normal.        Speech: Speech normal.        Behavior: Behavior normal. Behavior is cooperative.    Results for orders placed or performed in visit on 07/01/19  Comprehensive metabolic panel  Result Value Ref Range   Glucose 98 65 - 99 mg/dL   BUN 12 6 - 24 mg/dL   Creatinine, Ser 0.79 0.57 - 1.00 mg/dL   GFR calc non Af Amer 88 >59 mL/min/1.73   GFR calc Af Amer 101 >59 mL/min/1.73   BUN/Creatinine Ratio 15 9 - 23   Sodium 138 134 - 144 mmol/L   Potassium 3.9 3.5 - 5.2 mmol/L   Chloride 99 96 - 106 mmol/L   CO2 21 20 - 29 mmol/L   Calcium 9.4 8.7 - 10.2 mg/dL   Total Protein 7.3 6.0 - 8.5 g/dL    Albumin 4.5 3.8 - 4.8 g/dL   Globulin, Total 2.8 1.5 - 4.5 g/dL   Albumin/Globulin Ratio 1.6 1.2 - 2.2   Bilirubin Total 0.6 0.0 - 1.2 mg/dL   Alkaline Phosphatase 122 (H) 39 - 117 IU/L   AST 86 (H) 0 - 40 IU/L   ALT 88 (H) 0 - 32 IU/L  CBC with Differential/Platelet  Result Value Ref Range   WBC 7.4 3.4 - 10.8 x10E3/uL  RBC 4.57 3.77 - 5.28 x10E6/uL   Hemoglobin 13.9 11.1 - 15.9 g/dL   Hematocrit 41.4 34.0 - 46.6 %   MCV 91 79 - 97 fL   MCH 30.4 26.6 - 33.0 pg   MCHC 33.6 31.5 - 35.7 g/dL   RDW 12.7 11.7 - 15.4 %   Platelets 323 150 - 450 x10E3/uL   Neutrophils 62 Not Estab. %   Lymphs 26 Not Estab. %   Monocytes 7 Not Estab. %   Eos 3 Not Estab. %   Basos 1 Not Estab. %   Neutrophils Absolute 4.7 1.4 - 7.0 x10E3/uL   Lymphocytes Absolute 1.9 0.7 - 3.1 x10E3/uL   Monocytes Absolute 0.5 0.1 - 0.9 x10E3/uL   EOS (ABSOLUTE) 0.2 0.0 - 0.4 x10E3/uL   Basophils Absolute 0.1 0.0 - 0.2 x10E3/uL   Immature Granulocytes 1 Not Estab. %   Immature Grans (Abs) 0.0 0.0 - 0.1 x10E3/uL  Lipid Panel w/o Chol/HDL Ratio  Result Value Ref Range   Cholesterol, Total 220 (H) 100 - 199 mg/dL   Triglycerides 108 0 - 149 mg/dL   HDL 84 >39 mg/dL   VLDL Cholesterol Cal 19 5 - 40 mg/dL   LDL Chol Calc (NIH) 117 (H) 0 - 99 mg/dL  VITAMIN D 25 Hydroxy (Vit-D Deficiency, Fractures)  Result Value Ref Range   Vit D, 25-Hydroxy 34.9 30.0 - 100.0 ng/mL  TSH  Result Value Ref Range   TSH 1.510 0.450 - 4.500 uIU/mL      Assessment & Plan:   Problem List Items Addressed This Visit      Cardiovascular and Mediastinum   Hypertension    Chronic, ongoing.  Initial BP elevated today, but repeat at goal.  Continue current medication regimen, HCTZ and Benazepril, adjust as needed.  Could consider addition of BB if ongoing palpitations.  Recommend she monitor BP at home at least a few days a week and document.  DASH diet recommended.  CMP today.        Other   Insomnia    Chronic, ongoing with poor  response to Lunesta, Trazodone, and Melatonin.  Will send in script for Belsomra and fill out PA if needed, since has failed two other trials of medications.  Patient agrees with this plan.  Recommend continued focus on sleep hygiene at home and changes.  Return in 6 weeks for follow-up.      Palpitations - Primary    Ongoing with some improvement with change in sleep pattern.  Suspect related to poor sleep pattern and caffeine intake, possible PVCs.  Continue collaboration with cardiology.  Could consider addition of BB in future for BP and HR if ongoing palpitations.  Return in 6 weeks.      Elevated LFTs    New, noted on recent labs.  Recheck LFT and add on GGT today.  If continued elevation obtain u/s RUQ.  Discussed with patient and provided education.      Relevant Orders   Comprehensive metabolic panel   Gamma GT      Controlled substance.  Checked data base and no other refills of controlled substances noted.  Last Tramadol refill on 10/20/2018 for #30 tablets, minimal use, and Lunesta filled 07/10/2019.  Follow up plan: Return in about 6 weeks (around 09/09/2019) for Insomnia and elevated LFT.

## 2019-07-29 NOTE — Assessment & Plan Note (Signed)
Chronic, ongoing.  Initial BP elevated today, but repeat at goal.  Continue current medication regimen, HCTZ and Benazepril, adjust as needed.  Could consider addition of BB if ongoing palpitations.  Recommend she monitor BP at home at least a few days a week and document.  DASH diet recommended.  CMP today.

## 2019-07-29 NOTE — Patient Instructions (Signed)
Liver Function Tests Why am I having this test? Liver function tests are done to see how well your liver is working. The proteins and enzymes measured in the test can alert your health care provider to inflammation, damage, or disease in your liver. It is common to have liver function tests:  When you are taking certain medicines.  If you have liver disease.  If you drink a lot of alcohol.  When you are not feeling well.  When you have other conditions that may affect your liver.  During annual physical exams.  If you have symptoms such as yellowing of the skin (jaundice), abdominal pain, or nausea and vomiting. What is being tested? These tests measure various substances in your blood. This may include:  Alanine transaminase (ALT). This is an enzyme in the liver.  Aspartate transaminase (AST). This is an enzyme in the liver, heart, and muscles.  Alkaline phosphatase (ALP). This is a protein in the liver, bile ducts, bone, and other body tissues.  Total bilirubin. This is a yellow pigment in bile.  Albumin. This is a protein in the liver.  Prothrombin time and international normalized ratio (PT and INR). PT measures the time it takes for your blood to clot. INR is a calculation of blood clotting time based on your PT result. It is also calculated based on normal ranges defined by the lab that processed your test.  Total protein. This includes two proteins, albumin and globulin, found in the blood. What kind of sample is taken?  A blood sample is required for this test. It is usually collected by inserting a needle into a blood vessel. How do I prepare for this test? How you prepare will depend on which tests are being done and the reason for doing them. You may need to:  Avoid eating for 4-6 hours before the test, or as told by your health care provider.  Stop taking certain medicines before your blood test, as told by your health care provider. Tell a health care provider  about:  All medicines you are taking, including vitamins, herbs, eye drops, creams, and over-the-counter medicines.  Any medical conditions you have.  Whether you are pregnant or may be pregnant. How are the results reported? Your test results will be reported as values. Your health care provider will compare your results to normal ranges that were established after testing a large group of people (reference ranges). Reference ranges may vary among labs and hospitals. For the substances measured in liver function tests, common reference ranges are: ALT  Infant: 10-40 international units/L.  Child or adult: 4-36 international units/L at 37C or 4-36 units/L (SI units).  Reference ranges may be higher for older adults. AST  Newborn 44-22 days old: 35-140 units/L.  Child younger than 92 years old: 15-60 units/L.  56-71 years old: 15-50 units/L.  97-7 years old: 10-50 units/L.  57-57 years old: 10-40 units/L.  Adult: 0-35 units/L or 0-0.58 microkatals/L (SI units).  Reference ranges may be higher for older adults. ALP  Child younger than 61 years old: 85-235 units/L.  20-66 years old: 65-210 units/L.  11-67 years old: 60-300 units/L.  1-26 years old: 30-200 units/L.  Adult: 30-120 units/L or 0.5-2.0 microkatals/L (SI units).  Reference ranges may be higher for older adults. Total bilirubin  Newborn: 1.0-12.0 mg/dL or 17.1-205 micromoles/L (SI units).  Child or adult: 0.3-1.0 mg/dL or 5.1-17 micromoles/L. Albumin  Premature infant: 3.0-4.2 g/dL.  Newborn: 3.5-5.4 g/dL.  Infant: 4.4-5.4 g/dL.  Child:  4.0-5.9 g/dL.  Adult: 3.5-5.0 g/dL or 35-50 g/L (SI units). PT  11.0-12.5 seconds; 85%-100%. INR  0.8-1.1. Total protein  Premature infant: 4.2-7.6 g/dL.  Newborn: 4.6-7.4 g/dL.  Infant: 6.0-6.7 g/dL.  Child: 6.2-8.0 g/dL.  Adult: 6.4-8.3 g/dL or 64-83 g/L (SI units). What do the results mean? Results that are within the reference ranges are considered  normal. For each substance measured, results outside the reference range can indicate various health issues. ALT  Levels above the normal range may indicate liver disease. AST  Levels above the normal range may indicate liver disease. Sometimes levels also increase after burns, surgery, heart attack, muscle damage, or seizure. ALP  Levels above the normal range may be seen in biliary obstruction, liver diseases, bone disease, thyroid disease, tumors, fractures, leukemia, lymphoma, or several other conditions. People with blood type O or B may show higher levels after a fatty meal.  Levels below the normal range may indicate bone and teeth conditions, malnutrition, protein deficiency, or Wilson's disease. Total bilirubin  Levels above the normal range may indicate problems with the liver, gallbladder, or bile ducts. Albumin  Levels above the normal range may indicate dehydration. They may also be caused by a diet that is high in protein.  Levels below the normal range may indicate kidney disease, liver disease, or malabsorption of nutrients. PT and INR  Levels above the normal range mean that your blood is clotting slower than normal. This may be due to blood disorders, liver disorders, or low levels of vitamin K. Total protein  Levels above the normal range may be due to infection or other diseases.  Levels below the normal range may be due to an immune system disorder, bleeding, burns, kidney disorder, liver disease, trouble absorbing or getting nutrients, or other conditions that affect the intestines. Talk with your health care provider about what your results mean. Questions to ask your health care provider Ask your health care provider, or the department that is doing the test:  When will my results be ready?  How will I get my results?  What are my treatment options?  What other tests do I need?  What are my next steps? Summary  Liver function tests are done to see  how well your liver is working.  These tests measure various proteins and enzymes in your blood. The results can alert your health care provider to inflammation, damage, or disease in your liver.  Talk with your health care provider about what your results mean. This information is not intended to replace advice given to you by your health care provider. Make sure you discuss any questions you have with your health care provider. Document Revised: 12/12/2017 Document Reviewed: 02/06/2017 Elsevier Patient Education  [place].

## 2019-07-29 NOTE — Assessment & Plan Note (Signed)
Ongoing with some improvement with change in sleep pattern.  Suspect related to poor sleep pattern and caffeine intake, possible PVCs.  Continue collaboration with cardiology.  Could consider addition of BB in future for BP and HR if ongoing palpitations.  Return in 6 weeks.

## 2019-07-29 NOTE — Assessment & Plan Note (Signed)
Chronic, ongoing with poor response to Lunesta, Trazodone, and Melatonin.  Will send in script for Belsomra and fill out PA if needed, since has failed two other trials of medications.  Patient agrees with this plan.  Recommend continued focus on sleep hygiene at home and changes.  Return in 6 weeks for follow-up.

## 2019-07-29 NOTE — Telephone Encounter (Signed)
Letter generated and printed.  Will fax once Jolene signs.

## 2019-07-30 LAB — COMPREHENSIVE METABOLIC PANEL
ALT: 76 IU/L — ABNORMAL HIGH (ref 0–32)
AST: 73 IU/L — ABNORMAL HIGH (ref 0–40)
Albumin/Globulin Ratio: 1.7 (ref 1.2–2.2)
Albumin: 4.7 g/dL (ref 3.8–4.8)
Alkaline Phosphatase: 129 IU/L — ABNORMAL HIGH (ref 39–117)
BUN/Creatinine Ratio: 8 — ABNORMAL LOW (ref 9–23)
BUN: 7 mg/dL (ref 6–24)
Bilirubin Total: 0.7 mg/dL (ref 0.0–1.2)
CO2: 23 mmol/L (ref 20–29)
Calcium: 9.9 mg/dL (ref 8.7–10.2)
Chloride: 98 mmol/L (ref 96–106)
Creatinine, Ser: 0.91 mg/dL (ref 0.57–1.00)
GFR calc Af Amer: 85 mL/min/{1.73_m2} (ref >59)
GFR calc non Af Amer: 74 mL/min/{1.73_m2} (ref >59)
Globulin, Total: 2.7 g/dL (ref 1.5–4.5)
Glucose: 93 mg/dL (ref 65–99)
Potassium: 4.2 mmol/L (ref 3.5–5.2)
Sodium: 137 mmol/L (ref 134–144)
Total Protein: 7.4 g/dL (ref 6.0–8.5)

## 2019-07-30 LAB — GAMMA GT: GGT: 90 IU/L — ABNORMAL HIGH (ref 0–60)

## 2019-07-30 NOTE — Progress Notes (Signed)
Contacted via MyChart

## 2019-07-31 ENCOUNTER — Other Ambulatory Visit: Payer: Self-pay | Admitting: Nurse Practitioner

## 2019-07-31 ENCOUNTER — Encounter: Payer: Self-pay | Admitting: Nurse Practitioner

## 2019-07-31 DIAGNOSIS — R7989 Other specified abnormal findings of blood chemistry: Secondary | ICD-10-CM

## 2019-07-31 NOTE — Progress Notes (Signed)
Order for RUQ U/S due to elevation in LFT

## 2019-08-05 ENCOUNTER — Other Ambulatory Visit: Payer: Self-pay

## 2019-08-05 ENCOUNTER — Ambulatory Visit
Admission: RE | Admit: 2019-08-05 | Discharge: 2019-08-05 | Disposition: A | Discharge status: Home / Self Care | Payer: BC Managed Care – PPO | Source: Ambulatory Visit | Attending: Nurse Practitioner | Admitting: Nurse Practitioner

## 2019-08-05 DIAGNOSIS — R7989 Other specified abnormal findings of blood chemistry: Secondary | ICD-10-CM | POA: Diagnosis not present

## 2019-08-05 DIAGNOSIS — K7689 Other specified diseases of liver: Secondary | ICD-10-CM | POA: Diagnosis not present

## 2019-08-05 NOTE — Progress Notes (Signed)
Contacted via MyChart

## 2019-08-07 NOTE — Telephone Encounter (Signed)
Patient notified and verbalized understanding. 

## 2019-08-07 NOTE — Telephone Encounter (Signed)
Appeal Approved.

## 2019-08-08 ENCOUNTER — Ambulatory Visit: Payer: BC Managed Care – PPO | Admitting: Cardiology

## 2019-09-08 ENCOUNTER — Encounter: Payer: Self-pay | Admitting: Nurse Practitioner

## 2019-09-09 ENCOUNTER — Ambulatory Visit: Payer: BC Managed Care – PPO | Admitting: Nurse Practitioner

## 2019-09-17 ENCOUNTER — Other Ambulatory Visit: Payer: Self-pay

## 2019-09-17 MED ORDER — TRAMADOL HCL 50 MG PO TABS: 50.0000 mg | ORAL_TABLET | Freq: Four times a day (QID) | ORAL | 0 refills | Status: DC | PRN

## 2019-09-17 NOTE — Telephone Encounter (Signed)
Reviewed PMP and last Tramadol refill 10/20/18

## 2019-09-17 NOTE — Telephone Encounter (Signed)
Patient last seen 07/29/19

## 2019-09-22 ENCOUNTER — Other Ambulatory Visit: Payer: Self-pay | Admitting: Nurse Practitioner

## 2019-11-18 ENCOUNTER — Other Ambulatory Visit: Payer: Self-pay | Admitting: Nurse Practitioner

## 2019-11-25 DIAGNOSIS — Z01419 Encounter for gynecological examination (general) (routine) without abnormal findings: Secondary | ICD-10-CM | POA: Diagnosis not present

## 2019-11-25 LAB — HM PAP SMEAR

## 2019-11-26 ENCOUNTER — Other Ambulatory Visit: Payer: Self-pay | Admitting: Nurse Practitioner

## 2019-11-26 DIAGNOSIS — N644 Mastodynia: Secondary | ICD-10-CM

## 2019-12-01 MED ORDER — BENAZEPRIL HCL 40 MG PO TABS: 40.0000 mg | ORAL_TABLET | Freq: Every day | ORAL | 0 refills | Status: DC

## 2019-12-01 MED ORDER — HYDROCHLOROTHIAZIDE 25 MG PO TABS: 25.0000 mg | ORAL_TABLET | Freq: Every day | ORAL | 0 refills | Status: DC

## 2019-12-08 ENCOUNTER — Ambulatory Visit
Admission: RE | Admit: 2019-12-08 | Discharge: 2019-12-08 | Disposition: A | Discharge status: Home / Self Care | Payer: BC Managed Care – PPO | Source: Ambulatory Visit | Attending: Nurse Practitioner | Admitting: Nurse Practitioner

## 2019-12-08 ENCOUNTER — Other Ambulatory Visit: Payer: Self-pay

## 2019-12-08 DIAGNOSIS — N644 Mastodynia: Secondary | ICD-10-CM

## 2019-12-08 DIAGNOSIS — R922 Inconclusive mammogram: Secondary | ICD-10-CM | POA: Diagnosis not present

## 2019-12-08 DIAGNOSIS — N6489 Other specified disorders of breast: Secondary | ICD-10-CM | POA: Diagnosis not present

## 2019-12-12 ENCOUNTER — Encounter: Payer: Self-pay | Admitting: Nurse Practitioner

## 2019-12-12 ENCOUNTER — Ambulatory Visit (INDEPENDENT_AMBULATORY_CARE_PROVIDER_SITE_OTHER): Payer: BC Managed Care – PPO | Admitting: Nurse Practitioner

## 2019-12-12 ENCOUNTER — Other Ambulatory Visit: Payer: Self-pay

## 2019-12-12 VITALS — BP 124/78 | HR 74 | Temp 98.1°F | Wt 196.8 lb

## 2019-12-12 DIAGNOSIS — F332 Major depressive disorder, recurrent severe without psychotic features: Secondary | ICD-10-CM

## 2019-12-12 DIAGNOSIS — F5101 Primary insomnia: Secondary | ICD-10-CM | POA: Diagnosis not present

## 2019-12-12 DIAGNOSIS — Z6829 Body mass index (BMI) 29.0-29.9, adult: Secondary | ICD-10-CM

## 2019-12-12 MED ORDER — BUPROPION HCL ER (SR) 150 MG PO TB12: 150.0000 mg | ORAL_TABLET | Freq: Every day | ORAL | 4 refills | Status: DC

## 2019-12-12 MED ORDER — VENLAFAXINE HCL ER 75 MG PO CP24: 75.0000 mg | ORAL_CAPSULE | Freq: Every day | ORAL | 4 refills | Status: DC

## 2019-12-12 MED ORDER — TRAZODONE HCL 50 MG PO TABS: 25.0000 mg | ORAL_TABLET | Freq: Every evening | ORAL | 3 refills | Status: DC | PRN

## 2019-12-12 NOTE — Patient Instructions (Signed)
Living With Depression Everyone experiences occasional disappointment, sadness, and loss in their lives. When you are feeling down, blue, or sad for at least 2 weeks in a row, it may mean that you have depression. Depression can affect your thoughts and feelings, relationships, daily activities, and physical health. It is caused by changes in the way your brain functions. If you receive a diagnosis of depression, your health care provider will tell you which type of depression you have and what treatment options are available to you. If you are living with depression, there are ways to help you recover from it and also ways to prevent it from coming back. How to cope with lifestyle changes Coping with stress     Stress is your body's reaction to life changes and events, both good and bad. Stressful situations may include:  Getting married.  The death of a spouse.  Losing a job.  Retiring.  Having a baby. Stress can last just a few hours or it can be ongoing. Stress can play a major role in depression, so it is important to learn both how to cope with stress and how to think about it differently. Talk with your health care provider or a counselor if you would like to learn more about stress reduction. He or she may suggest some stress reduction techniques, such as:  Music therapy. This can include creating music or listening to music. Choose music that you enjoy and that inspires you.  Mindfulness-based meditation. This kind of meditation can be done while sitting or walking. It involves being aware of your normal breaths, rather than trying to control your breathing.  Centering prayer. This is a kind of meditation that involves focusing on a spiritual word or phrase. Choose a word, phrase, or sacred image that is meaningful to you and that brings you peace.  Deep breathing. To do this, expand your stomach and inhale slowly through your nose. Hold your breath for 3-5 seconds, then exhale  slowly, allowing your stomach muscles to relax.  Muscle relaxation. This involves intentionally tensing muscles then relaxing them. Choose a stress reduction technique that fits your lifestyle and personality. Stress reduction techniques take time and practice to develop. Set aside 5-15 minutes a day to do them. Therapists can offer training in these techniques. The training may be covered by some insurance plans. Other things you can do to manage stress include:  Keeping a stress diary. This can help you learn what triggers your stress and ways to control your response.  Understanding what your limits are and saying no to requests or events that lead to a schedule that is too full.  Thinking about how you respond to certain situations. You may not be able to control everything, but you can control how you react.  Adding humor to your life by watching funny films or TV shows.  Making time for activities that help you relax and not feeling guilty about spending your time this way.  Medicines Your health care provider may suggest certain medicines if he or she feels that they will help improve your condition. Avoid using alcohol and other substances that may prevent your medicines from working properly (may interact). It is also important to:  Talk with your pharmacist or health care provider about all the medicines that you take, their possible side effects, and what medicines are safe to take together.  Make it your goal to take part in all treatment decisions (shared decision-making). This includes giving input on   the side effects of medicines. It is best if shared decision-making with your health care provider is part of your total treatment plan. If your health care provider prescribes a medicine, you may not notice the full benefits of it for 4-8 weeks. Most people who are treated for depression need to be on medicine for at least 6-12 months after they feel better. If you are taking  medicines as part of your treatment, do not stop taking medicines without first talking to your health care provider. You may need to have the medicine slowly decreased (tapered) over time to decrease the risk of harmful side effects. Relationships Your health care provider may suggest family therapy along with individual therapy and drug therapy. While there may not be family problems that are causing you to feel depressed, it is still important to make sure your family learns as much as they can about your mental health. Having your family's support can help make your treatment successful. How to recognize changes in your condition Everyone has a different response to treatment for depression. Recovery from major depression happens when you have not had signs of major depression for two months. This may mean that you will start to:  Have more interest in doing activities.  Feel less hopeless than you did 2 months ago.  Have more energy.  Overeat less often, or have better or improving appetite.  Have better concentration. Your health care provider will work with you to decide the next steps in your recovery. It is also important to recognize when your condition is getting worse. Watch for these signs:  Having fatigue or low energy.  Eating too much or too little.  Sleeping too much or too little.  Feeling restless, agitated, or hopeless.  Having trouble concentrating or making decisions.  Having unexplained physical complaints.  Feeling irritable, angry, or aggressive. Get help as soon as you or your family members notice these symptoms coming back. How to get support and help from others How to talk with friends and family members about your condition  Talking to friends and family members about your condition can provide you with one way to get support and guidance. Reach out to trusted friends or family members, explain your symptoms to them, and let them know that you are  working with a health care provider to treat your depression. Financial resources Not all insurance plans cover mental health care, so it is important to check with your insurance carrier. If paying for co-pays or counseling services is a problem, search for a local or county mental health care center. They may be able to offer public mental health care services at low or no cost when you are not able to see a private health care provider. If you are taking medicine for depression, you may be able to get the generic form, which may be less expensive. Some makers of prescription medicines also offer help to patients who cannot afford the medicines they need. Follow these instructions at home:   Get the right amount and quality of sleep.  Cut down on using caffeine, tobacco, alcohol, and other potentially harmful substances.  Try to exercise, such as walking or lifting small weights.  Take over-the-counter and prescription medicines only as told by your health care provider.  Eat a healthy diet that includes plenty of vegetables, fruits, whole grains, low-fat dairy products, and lean protein. Do not eat a lot of foods that are high in solid fats, added sugars, or salt.    Keep all follow-up visits as told by your health care provider. This is important. Contact a health care provider if:  You stop taking your antidepressant medicines, and you have any of these symptoms: ? Nausea. ? Headache. ? Feeling lightheaded. ? Chills and body aches. ? Not being able to sleep (insomnia).  You or your friends and family think your depression is getting worse. Get help right away if:  You have thoughts of hurting yourself or others. If you ever feel like you may hurt yourself or others, or have thoughts about taking your own life, get help right away. You can go to your nearest emergency department or call:  Your local emergency services (911 in the [place]  A suicide crisis helpline, such as the  National Suicide Prevention Lifeline at 1-800-273-8255. This is open 24-hours a day. Summary  If you are living with depression, there are ways to help you recover from it and also ways to prevent it from coming back.  Work with your health care team to create a management plan that includes counseling, stress management techniques, and healthy lifestyle habits. This information is not intended to replace advice given to you by your health care provider. Make sure you discuss any questions you have with your health care provider. Document Revised: 08/16/2018 Document Reviewed: 03/27/2016 Elsevier Patient Education  2020 Elsevier Inc.  

## 2019-12-12 NOTE — Assessment & Plan Note (Signed)
Ongoing, referral to weight management [place].

## 2019-12-12 NOTE — Assessment & Plan Note (Signed)
Chronic, ongoing.  Continue Trazodone. Refills sent. Recommend focus on healthy sleep hygiene.  Return in 6 months.

## 2019-12-12 NOTE — Assessment & Plan Note (Signed)
Chronic, ongoing.  Will return to Effexor 75 MG and Wellbutrin 150 MG BID.  Denies SI/HI.  Return to office in 6 months.

## 2019-12-12 NOTE — Progress Notes (Signed)
BP 124/78    Pulse 74    Temp 98.1 F (36.7 C) (Oral)    Wt 196 lb 12.8 oz (89.3 kg)    LMP 12/09/2019 (Exact Date)    SpO2 98%    BMI 29.92 kg/m    Subjective:    Patient ID: Alexandra Le, female    DOB: [birth year], 49 y.o.   MRN: [id number]  HPI: Alexandra Le is a 49 y.o. female  Chief Complaint  Patient presents with   Depression   Insomnia   DEPRESSION/INSOMNIA Last visit (07/29/19).  Currently taking Effexor XR 75 MG daily, Wellbutrin 150 MG daily, and Trazodone 25 MG as needed for sleep.   She is interested in seeing weight loss provider.  Mood status: stable Satisfied with current treatment?: yes Symptom severity: mild  Duration of current treatment : chronic Side effects: no Medication compliance: good compliance Psychotherapy/counseling: none Previous psychiatric medications: Trazodone, Effexor, Wellbutrin, Lunesta, Melatonin Depressed mood: no Anxious mood: no Anhedonia: no Significant weight loss or gain: no Insomnia: yes hard to fall asleep Fatigue: no Feelings of worthlessness or guilt: no Impaired concentration/indecisiveness: no Suicidal ideations: no Hopelessness: no Crying spells: no Depression screen [facility] 2/9 12/12/2019 01/17/2019 01/03/2019 07/05/2018 12/19/2017  Decreased Interest 0 1 0 0 0  Down, Depressed, Hopeless 0 0 0 1 0  PHQ - 2 Score 0 1 0 1 0  Altered sleeping 3 2 3 3 2   Tired, decreased energy 3 1 2 1 1   Change in appetite 3 1 1 1 1   Feeling bad or failure about yourself  0 0 0 0 0  Trouble concentrating 1 1 1 1 1   Moving slowly or fidgety/restless 0 1 1 0 0  Suicidal thoughts 0 0 0 0 0  PHQ-9 Score 10 7 8 7 5   Difficult doing work/chores Not difficult at all Somewhat difficult Somewhat difficult Not difficult at all -    Relevant past medical, surgical, family and social history reviewed and updated as indicated. Interim medical history since our last visit reviewed. Allergies and medications reviewed and  updated.  Review of Systems  Constitutional: Negative for activity change, appetite change, diaphoresis, fatigue and fever.  Respiratory: Negative for cough, chest tightness and shortness of breath.   Cardiovascular: Negative for chest pain, palpitations and leg swelling.  Gastrointestinal: Negative.   Neurological: Negative.   Psychiatric/Behavioral: Negative for decreased concentration, self-injury, sleep disturbance and suicidal ideas. The patient is not nervous/anxious.     Per HPI unless specifically indicated above     Objective:    BP 124/78    Pulse 74    Temp 98.1 F (36.7 C) (Oral)    Wt 196 lb 12.8 oz (89.3 kg)    LMP 12/09/2019 (Exact Date)    SpO2 98%    BMI 29.92 kg/m   Wt Readings from Last 3 Encounters:  12/12/19 196 lb 12.8 oz (89.3 kg)  07/29/19 193 lb (87.5 kg)  07/25/19 191 lb 4 oz (86.8 kg)    Physical Exam Vitals and nursing note reviewed.  Constitutional:      Appearance: She is well-developed.  HENT:     Head: Normocephalic.  Eyes:     General:        Right eye: No discharge.        Left eye: No discharge.     Conjunctiva/sclera: Conjunctivae normal.     Pupils: Pupils are equal, round, and reactive to light.  Neck:     Thyroid:  No thyromegaly.     Vascular: No carotid bruit or JVD.  Cardiovascular:     Rate and Rhythm: Normal rate and regular rhythm.     Heart sounds: Normal heart sounds. No murmur heard.  No gallop.   Pulmonary:     Effort: Pulmonary effort is normal.     Breath sounds: Normal breath sounds.  Abdominal:     General: Bowel sounds are normal.     Palpations: Abdomen is soft.  Musculoskeletal:     Cervical back: Normal range of motion and neck supple.     Right lower leg: No edema.     Left lower leg: No edema.  Lymphadenopathy:     Cervical: No cervical adenopathy.  Skin:    General: Skin is warm and dry.  Neurological:     Mental Status: She is alert and oriented to person, place, and time.  Psychiatric:         Attention and Perception: Attention normal.        Mood and Affect: Mood normal.        Speech: Speech normal.        Behavior: Behavior normal.        Thought Content: Thought content normal.    Results for orders placed or performed in visit on 07/29/19  Comprehensive metabolic panel  Result Value Ref Range   Glucose 93 65 - 99 mg/dL   BUN 7 6 - 24 mg/dL   Creatinine, Ser 0.91 0.57 - 1.00 mg/dL   GFR calc non Af Amer 74 >59 mL/min/1.73   GFR calc Af Amer 85 >59 mL/min/1.73   BUN/Creatinine Ratio 8 (L) 9 - 23   Sodium 137 134 - 144 mmol/L   Potassium 4.2 3.5 - 5.2 mmol/L   Chloride 98 96 - 106 mmol/L   CO2 23 20 - 29 mmol/L   Calcium 9.9 8.7 - 10.2 mg/dL   Total Protein 7.4 6.0 - 8.5 g/dL   Albumin 4.7 3.8 - 4.8 g/dL   Globulin, Total 2.7 1.5 - 4.5 g/dL   Albumin/Globulin Ratio 1.7 1.2 - 2.2   Bilirubin Total 0.7 0.0 - 1.2 mg/dL   Alkaline Phosphatase 129 (H) 39 - 117 IU/L   AST 73 (H) 0 - 40 IU/L   ALT 76 (H) 0 - 32 IU/L  Gamma GT  Result Value Ref Range   GGT 90 (H) 0 - 60 IU/L      Assessment & Plan:   Problem List Items Addressed This Visit      Other   Depression - Primary    Chronic, ongoing.  Will return to Effexor 75 MG and Wellbutrin 150 MG BID.  Denies SI/HI.  Return to office in 6 months.      Relevant Medications   traZODone (DESYREL) 50 MG tablet   buPROPion (WELLBUTRIN SR) 150 MG 12 hr tablet   venlafaxine XR (EFFEXOR XR) 75 MG 24 hr capsule   Insomnia    Chronic, ongoing.  Continue Trazodone. Refills sent. Recommend focus on healthy sleep hygiene.  Return in 6 months.      BMI 29.0-29.9,adult    Ongoing, referral to weight management [place].      Relevant Orders   Amb Ref to Medical Weight Management       Follow up plan: Return in about 6 months (around 06/13/2020) for Annual physical.

## 2019-12-17 ENCOUNTER — Other Ambulatory Visit: Payer: Self-pay

## 2019-12-17 MED ORDER — HYDROCHLOROTHIAZIDE 25 MG PO TABS: 25.0000 mg | ORAL_TABLET | Freq: Every day | ORAL | 4 refills | Status: DC

## 2019-12-17 NOTE — Telephone Encounter (Signed)
Patient last seen 12/12/19 and needs RX sent to mail order.

## 2019-12-21 ENCOUNTER — Other Ambulatory Visit: Payer: Self-pay | Admitting: Nurse Practitioner

## 2019-12-21 NOTE — Telephone Encounter (Signed)
Requested Prescriptions  Pending Prescriptions Disp Refills  . venlafaxine XR (EFFEXOR-XR) 75 MG 24 hr capsule [Pharmacy Med Name: VENLAFAXINE HCL ER CAPS 75MG ] 90 capsule 1    Sig: TAKE 1 CAPSULE DAILY WITH BREAKFAST     Psychiatry: Antidepressants - SNRI - desvenlafaxine & venlafaxine Failed - 12/21/2019  6:29 AM      Failed - LDL in normal range and within 360 days    LDL Chol Calc (NIH)  Date Value Ref Range Status  07/01/2019 117 (H) 0 - 99 mg/dL Final         Failed - Total Cholesterol in normal range and within 360 days    Cholesterol, Total  Date Value Ref Range Status  07/01/2019 220 (H) 100 - 199 mg/dL Final         Passed - Triglycerides in normal range and within 360 days    Triglycerides  Date Value Ref Range Status  07/01/2019 108 0 - 149 mg/dL Final         Passed - Completed PHQ-2 or PHQ-9 in the last 360 days.      Passed - Last BP in normal range    BP Readings from Last 1 Encounters:  12/12/19 124/78         Passed - Valid encounter within last 6 months    Recent Outpatient Visits          1 week ago Severe episode of recurrent major depressive disorder, without psychotic features ([place])   [place], Barbaraann Faster, NP   4 months ago Palpitations   [place], [place] T, NP   5 months ago Palpitations   [place], [place] T, NP   8 months ago Essential hypertension   [place], [place] T, NP   11 months ago Severe episode of recurrent major depressive disorder, without psychotic features ([place])   [place] Marnee Guarneri T, NP             Previous RX on 12/12/2019 was submitted to [id number] in [place], [place].  This request is for [facility].

## 2019-12-22 DIAGNOSIS — N920 Excessive and frequent menstruation with regular cycle: Secondary | ICD-10-CM | POA: Diagnosis not present

## 2019-12-22 DIAGNOSIS — N949 Unspecified condition associated with female genital organs and menstrual cycle: Secondary | ICD-10-CM | POA: Diagnosis not present

## 2019-12-22 DIAGNOSIS — R300 Dysuria: Secondary | ICD-10-CM | POA: Diagnosis not present

## 2019-12-24 ENCOUNTER — Other Ambulatory Visit: Payer: Self-pay | Admitting: Family Medicine

## 2020-01-05 ENCOUNTER — Other Ambulatory Visit: Payer: Self-pay | Admitting: Nurse Practitioner

## 2020-01-05 NOTE — Telephone Encounter (Signed)
° °  Notes to clinic:  requesting 90 day with 2 refills   Requested Prescriptions  Pending Prescriptions Disp Refills   traZODone (DESYREL) 50 MG tablet [Pharmacy Med Name: TRAZODONE 50 MG TABLET] 90 tablet 2    Sig: Take 0.5-1 tablets (25-50 mg total) by mouth at bedtime as needed for sleep.      Psychiatry: Antidepressants - Serotonin Modulator Passed - 01/05/2020 12:33 PM      Passed - Completed PHQ-2 or PHQ-9 in the last 360 days.      Passed - Valid encounter within last 6 months    Recent Outpatient Visits           3 weeks ago Severe episode of recurrent major depressive disorder, without psychotic features ([place])   [place], Barbaraann Faster, NP   5 months ago Palpitations   [place], [place] T, NP   6 months ago Palpitations   [place], [place] T, NP   9 months ago Essential hypertension   [place], Jolene T, NP   11 months ago Severe episode of recurrent major depressive disorder, without psychotic features ([place])   [place], Barbaraann Faster, NP

## 2020-01-05 NOTE — Telephone Encounter (Signed)
Requesting to change to a 90 day supply.

## 2020-01-06 DIAGNOSIS — N920 Excessive and frequent menstruation with regular cycle: Secondary | ICD-10-CM | POA: Diagnosis not present

## 2020-01-20 DIAGNOSIS — Z20828 Contact with and (suspected) exposure to other viral communicable diseases: Secondary | ICD-10-CM | POA: Diagnosis not present

## 2020-01-26 ENCOUNTER — Other Ambulatory Visit: Payer: Self-pay | Admitting: Family Medicine

## 2020-02-10 DIAGNOSIS — Z20822 Contact with and (suspected) exposure to covid-19: Secondary | ICD-10-CM | POA: Diagnosis not present

## 2020-02-11 IMAGING — MG DIGITAL DIAGNOSTIC BILATERAL MAMMOGRAM WITH TOMO AND CAD
6 of 12 series · 6 of 36 positions shown · non-contrast
Comparison: Previous exam(s).

CLINICAL DATA: Bilateral breast areas of palpable concern.

EXAM:
DIGITAL DIAGNOSTIC BILATERAL MAMMOGRAM WITH CAD AND TOMO
ULTRASOUND BILATERAL BREAST

[L TAN synth-2D]
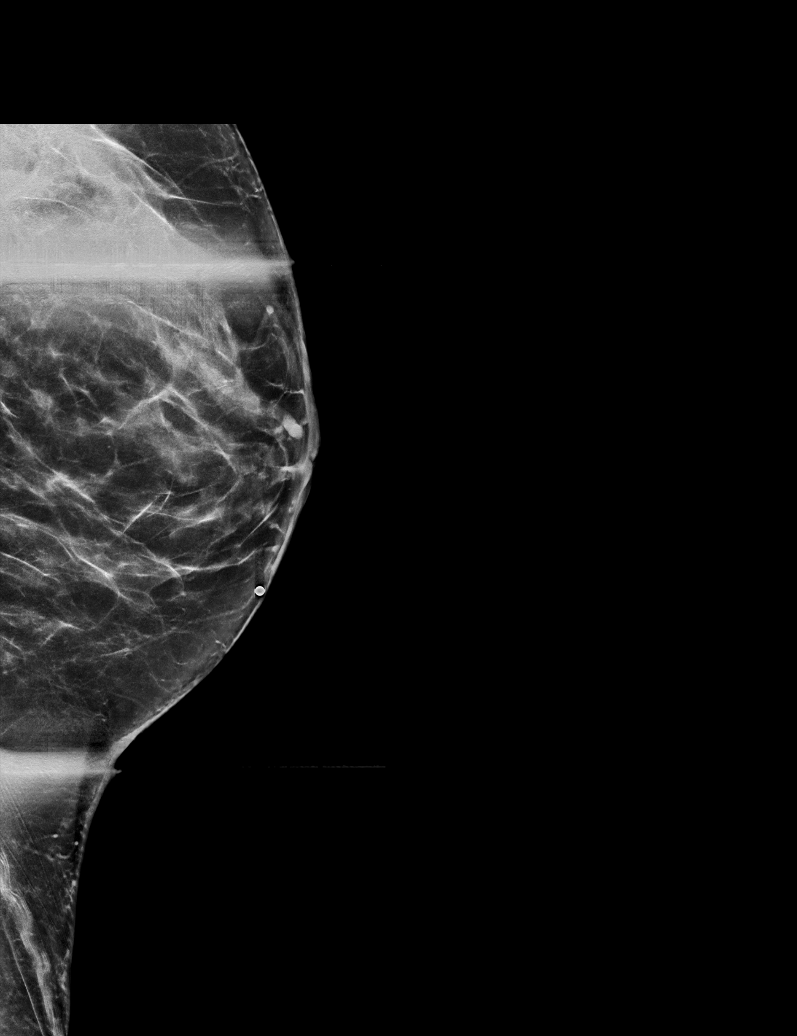

[R CC synth-2D]
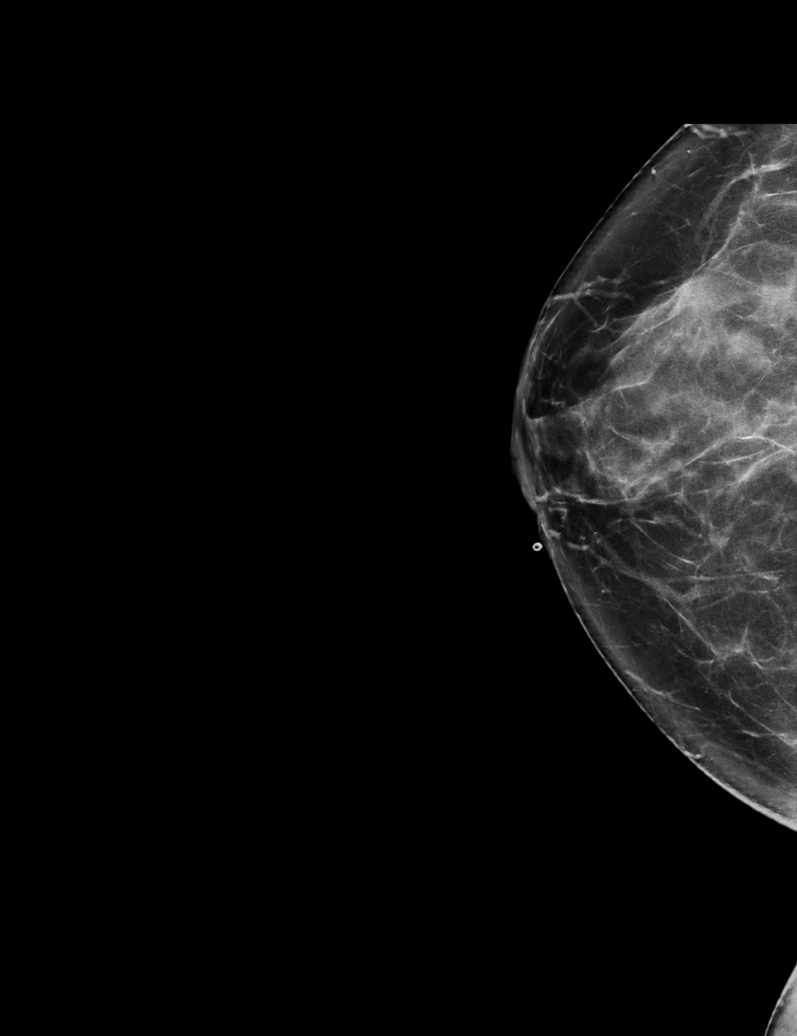

[L MLO synth-2D]
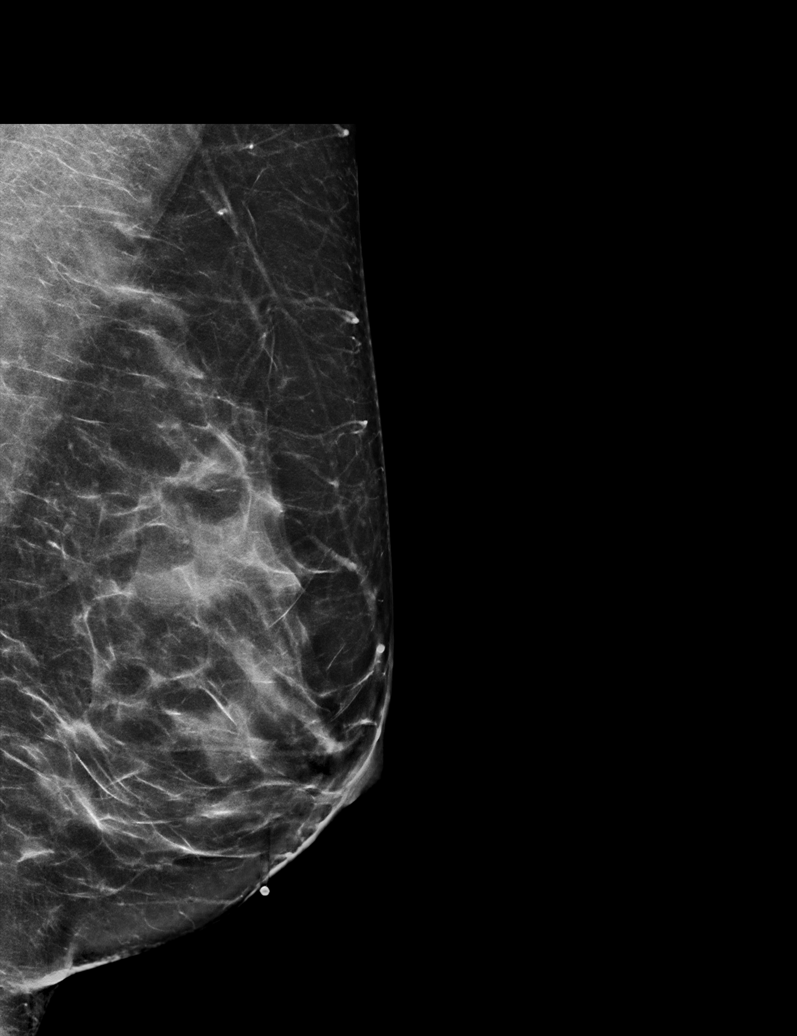

[L CC synth-2D]
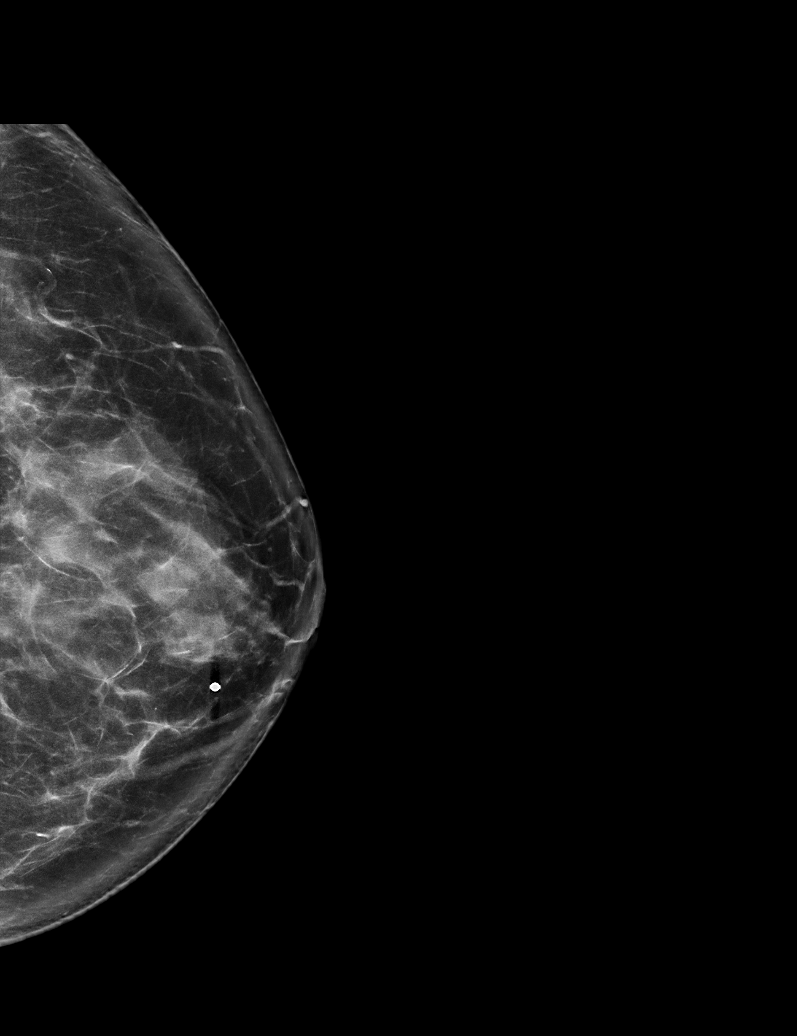

[R TAN synth-2D]
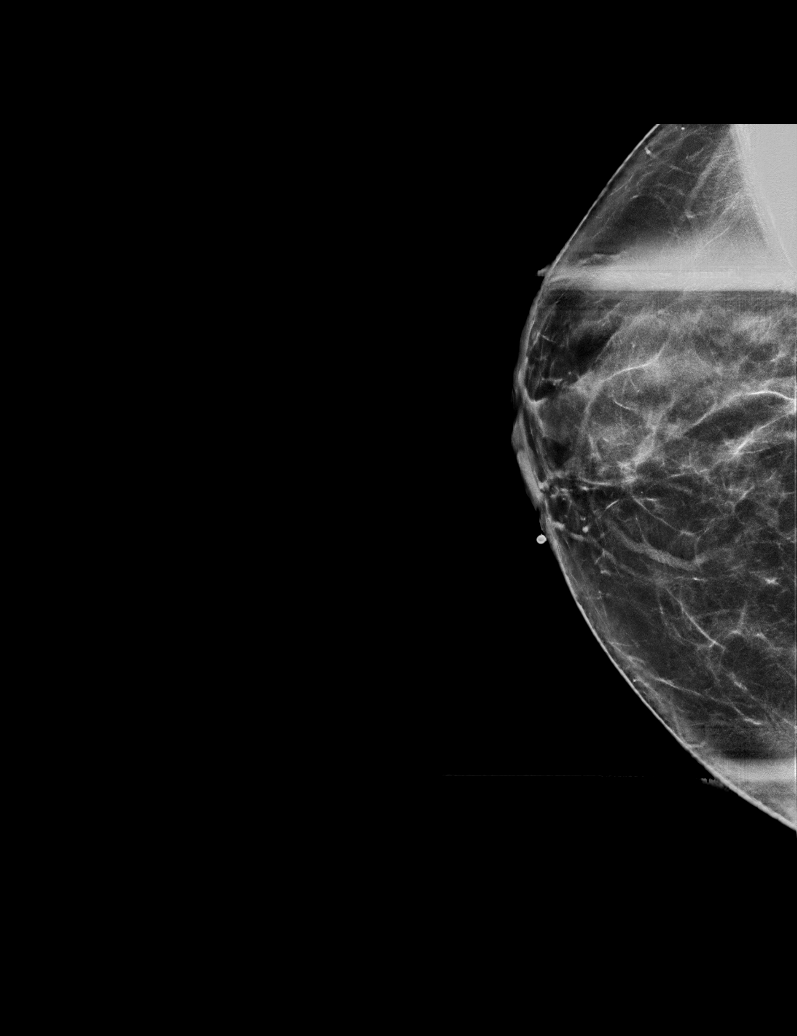

[R MLO synth-2D]
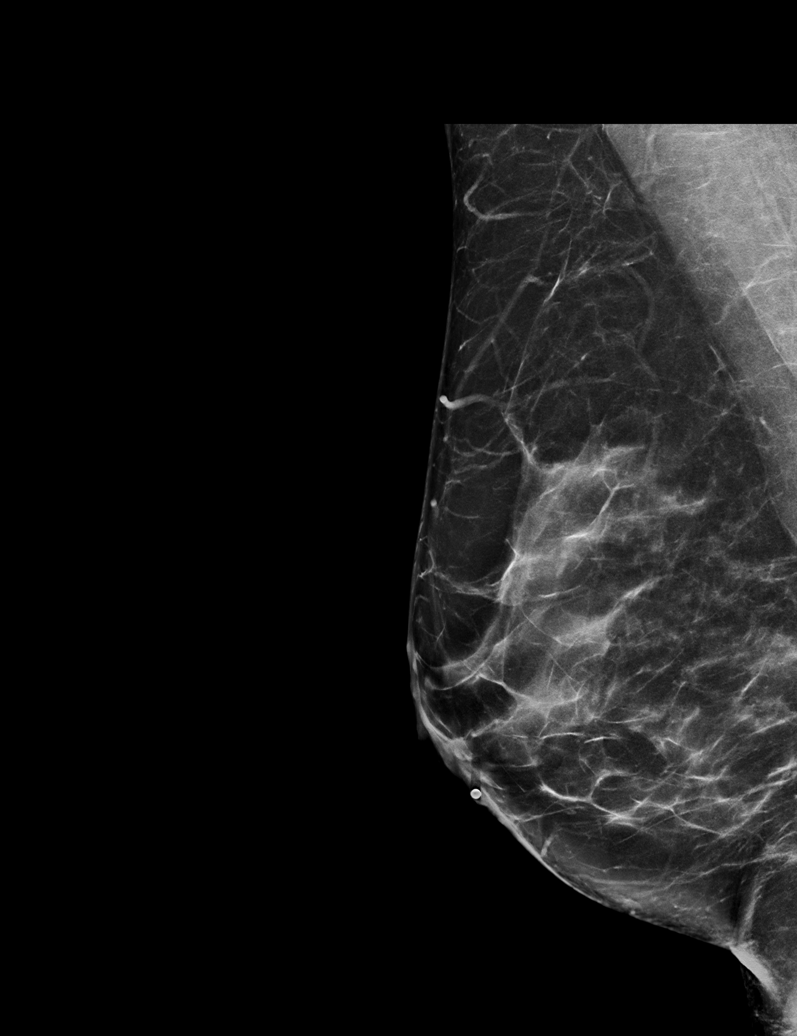

[6 of 36 positions shown; findings below may reference images not displayed]

ACR Breast Density Category c: The breast tissue is heterogeneously
dense, which may obscure small masses.
FINDINGS: Mammographically, there are no suspicious masses, areas of
architectural distortion or microcalcifications in either breast. No
mammographic correlation is found to the areas of palpable concern
in the right breast lower inner quadrant, and left breast lower
inner quadrant.

Mammographic images were processed with CAD.

On physical exam, no suspicious masses are palpated.

Targeted ultrasound of the bilateral breast areas of palpable
concern is performed, showing no suspicious masses or shadowing
lesions in either breast.
IMPRESSION: No mammographic or sonographic evidence of malignancy in either
breast.

RECOMMENDATION:
Further management of patient's bilateral breast areas of palpable
concern should be based on clinical grounds.

Otherwise, the patient will be due for a screening mammogram in 1
year.

I have discussed the findings and recommendations with the patient.
Results were also provided in writing at the conclusion of the
visit. If applicable, a reminder letter will be sent to the patient
regarding the next appointment.

BI-RADS CATEGORY  1: Negative.

## 2020-03-04 DIAGNOSIS — C44722 Squamous cell carcinoma of skin of right lower limb, including hip: Secondary | ICD-10-CM | POA: Diagnosis not present

## 2020-03-09 ENCOUNTER — Other Ambulatory Visit: Payer: Self-pay | Admitting: Nurse Practitioner

## 2020-03-13 DIAGNOSIS — M79641 Pain in right hand: Secondary | ICD-10-CM | POA: Diagnosis not present

## 2020-03-13 DIAGNOSIS — S59901A Unspecified injury of right elbow, initial encounter: Secondary | ICD-10-CM | POA: Diagnosis not present

## 2020-03-13 DIAGNOSIS — R9389 Abnormal findings on diagnostic imaging of other specified body structures: Secondary | ICD-10-CM | POA: Diagnosis not present

## 2020-03-13 DIAGNOSIS — M25531 Pain in right wrist: Secondary | ICD-10-CM | POA: Diagnosis not present

## 2020-03-13 DIAGNOSIS — M25521 Pain in right elbow: Secondary | ICD-10-CM | POA: Diagnosis not present

## 2020-03-15 DIAGNOSIS — M25531 Pain in right wrist: Secondary | ICD-10-CM | POA: Diagnosis not present

## 2020-03-15 DIAGNOSIS — M79641 Pain in right hand: Secondary | ICD-10-CM | POA: Diagnosis not present

## 2020-03-17 ENCOUNTER — Other Ambulatory Visit: Payer: Self-pay | Admitting: Nurse Practitioner

## 2020-03-17 DIAGNOSIS — D169 Benign neoplasm of bone and articular cartilage, unspecified: Secondary | ICD-10-CM | POA: Diagnosis not present

## 2020-03-23 DIAGNOSIS — D169 Benign neoplasm of bone and articular cartilage, unspecified: Secondary | ICD-10-CM | POA: Diagnosis not present

## 2020-03-30 DIAGNOSIS — K625 Hemorrhage of anus and rectum: Secondary | ICD-10-CM | POA: Diagnosis not present

## 2020-03-30 DIAGNOSIS — K219 Gastro-esophageal reflux disease without esophagitis: Secondary | ICD-10-CM | POA: Diagnosis not present

## 2020-03-30 DIAGNOSIS — R1319 Other dysphagia: Secondary | ICD-10-CM | POA: Diagnosis not present

## 2020-03-30 DIAGNOSIS — Z86010 Personal history of colonic polyps: Secondary | ICD-10-CM | POA: Diagnosis not present

## 2020-04-15 ENCOUNTER — Telehealth: Payer: Self-pay

## 2020-04-15 NOTE — Telephone Encounter (Signed)
Sent Medical records to [facility] [place] at [phone]. We had sign medical release form. Faxed colonoscopy and path report

## 2020-05-04 DIAGNOSIS — Z01812 Encounter for preprocedural laboratory examination: Secondary | ICD-10-CM | POA: Diagnosis not present

## 2020-05-07 DIAGNOSIS — K2970 Gastritis, unspecified, without bleeding: Secondary | ICD-10-CM | POA: Diagnosis not present

## 2020-05-07 DIAGNOSIS — R1314 Dysphagia, pharyngoesophageal phase: Secondary | ICD-10-CM | POA: Diagnosis not present

## 2020-05-07 DIAGNOSIS — K2100 Gastro-esophageal reflux disease with esophagitis, without bleeding: Secondary | ICD-10-CM | POA: Diagnosis not present

## 2020-05-07 DIAGNOSIS — R1319 Other dysphagia: Secondary | ICD-10-CM | POA: Diagnosis not present

## 2020-05-10 ENCOUNTER — Other Ambulatory Visit: Payer: Self-pay | Admitting: Family Medicine

## 2020-06-09 ENCOUNTER — Other Ambulatory Visit: Payer: Self-pay | Admitting: Nurse Practitioner

## 2020-06-09 MED ORDER — TRAZODONE HCL 50 MG PO TABS
25.0000 mg | ORAL_TABLET | Freq: Every evening | ORAL | 2 refills | Status: DC | PRN
Start: 2020-06-09 — End: 2021-09-30

## 2020-06-24 ENCOUNTER — Other Ambulatory Visit: Payer: Self-pay

## 2020-06-24 ENCOUNTER — Encounter: Payer: Self-pay | Admitting: Nurse Practitioner

## 2020-06-24 ENCOUNTER — Ambulatory Visit (INDEPENDENT_AMBULATORY_CARE_PROVIDER_SITE_OTHER): Payer: BC Managed Care – PPO | Admitting: Nurse Practitioner

## 2020-06-24 VITALS — BP 104/73 | HR 87 | Temp 97.7°F | Ht 68.43 in | Wt 201.0 lb

## 2020-06-24 DIAGNOSIS — Z0000 Encounter for general adult medical examination without abnormal findings: Secondary | ICD-10-CM

## 2020-06-24 DIAGNOSIS — N949 Unspecified condition associated with female genital organs and menstrual cycle: Secondary | ICD-10-CM | POA: Insufficient documentation

## 2020-06-24 DIAGNOSIS — Z1329 Encounter for screening for other suspected endocrine disorder: Secondary | ICD-10-CM | POA: Diagnosis not present

## 2020-06-24 DIAGNOSIS — N920 Excessive and frequent menstruation with regular cycle: Secondary | ICD-10-CM | POA: Insufficient documentation

## 2020-06-24 DIAGNOSIS — Z13220 Encounter for screening for lipoid disorders: Secondary | ICD-10-CM

## 2020-06-24 DIAGNOSIS — Z23 Encounter for immunization: Secondary | ICD-10-CM

## 2020-06-24 DIAGNOSIS — N898 Other specified noninflammatory disorders of vagina: Secondary | ICD-10-CM | POA: Insufficient documentation

## 2020-06-24 DIAGNOSIS — N871 Moderate cervical dysplasia: Secondary | ICD-10-CM | POA: Insufficient documentation

## 2020-06-24 DIAGNOSIS — N926 Irregular menstruation, unspecified: Secondary | ICD-10-CM | POA: Insufficient documentation

## 2020-06-24 DIAGNOSIS — R928 Other abnormal and inconclusive findings on diagnostic imaging of breast: Secondary | ICD-10-CM | POA: Insufficient documentation

## 2020-06-24 DIAGNOSIS — Z136 Encounter for screening for cardiovascular disorders: Secondary | ICD-10-CM | POA: Diagnosis not present

## 2020-06-24 DIAGNOSIS — Z1159 Encounter for screening for other viral diseases: Secondary | ICD-10-CM

## 2020-06-24 MED ORDER — BENAZEPRIL HCL 40 MG PO TABS: 40.0000 mg | ORAL_TABLET | Freq: Every day | ORAL | 4 refills | Status: DC

## 2020-06-24 MED ORDER — HYDROCHLOROTHIAZIDE 25 MG PO TABS: 25.0000 mg | ORAL_TABLET | Freq: Every day | ORAL | 4 refills | Status: DC

## 2020-06-24 MED ORDER — VENLAFAXINE HCL ER 75 MG PO CP24: ORAL_CAPSULE | ORAL | 4 refills | Status: DC

## 2020-06-24 NOTE — Progress Notes (Signed)
BP 104/73   Pulse 87   Temp 97.7 F (36.5 C) (Oral)   Ht 5' 8.43" (1.738 m)   Wt 201 lb (91.2 kg)   SpO2 100%   BMI 30.18 kg/m    Subjective:    Patient ID: Alexandra Le, female    DOB: Aug 07, 1970, 50 y.o.   MRN: [id number]  HPI: Alexandra Le is a 50 y.o. female presenting on 06/24/2020 for comprehensive medical examination. Current medical complaints include:none  She currently lives with: husband children Menopausal Symptoms: no   Gets pap exams and breast exams with GYN -- at [facility].  Depression Screen done today and results listed below:  Depression screen [facility] 2/9 06/24/2020 12/12/2019 01/17/2019 01/03/2019 07/05/2018  Decreased Interest 0 0 1 0 0  Down, Depressed, Hopeless 0 0 0 0 1  PHQ - 2 Score 0 0 1 0 1  Altered sleeping 0 3 2 3 3   Tired, decreased energy 0 3 1 2 1   Change in appetite 0 3 1 1 1   Feeling bad or failure about yourself  0 0 0 0 0  Trouble concentrating 0 1 1 1 1   Moving slowly or fidgety/restless 0 0 1 1 0  Suicidal thoughts 0 0 0 0 0  PHQ-9 Score 0 10 7 8 7   Difficult doing work/chores - Not difficult at all Somewhat difficult Somewhat difficult Not difficult at all  Some recent data might be hidden    The patient does not have a history of falls. I did not complete a risk assessment for falls. A plan of care for falls was not documented.   Past Medical History:  Past Medical History:  Diagnosis Date  . Anemia    history of anemai due to AUB  . Anxiety   . Chest pain of unknown etiology 2015   was seen in [place], [place], [place]- not dx'd and no follow up  . Depression   . Hypertension   . PONV (postoperative nausea and vomiting)     Surgical History:  Past Surgical History:  Procedure Laterality Date  . BIOPSY N/A 07/21/2016   Procedure: BIOPSY;  Surgeon: Lucilla Lame, MD;  Location: [place];  Service: Endoscopy;  Laterality: N/A;  sigmoid polyps x2  . CERVICAL CONIZATION W/BX N/A 04/30/2015    Procedure: CONIZATION CERVIX WITH BIOPSY;  Surgeon: Christophe Louis, MD;  Location: [facility];  Service: Gynecology;  Laterality: N/A;  . COLONOSCOPY WITH PROPOFOL N/A 07/21/2016   Procedure: COLONOSCOPY WITH PROPOFOL;  Surgeon: Lucilla Lame, MD;  Location: [place];  Service: Endoscopy;  Laterality: N/A;  . pyloric stenosis    . TUBAL LIGATION      Medications:  Current Outpatient Medications on File Prior to Visit  Medication Sig  . buPROPion (WELLBUTRIN SR) 150 MG 12 hr tablet Take 1 tablet (150 mg total) by mouth daily.  . Cholecalciferol (VITAMIN D PO) Take by mouth daily.  . Multiple Vitamin (MULTIVITAMIN) tablet Take 1 tablet by mouth daily.  . traMADol (ULTRAM) 50 MG tablet Take 1 tablet (50 mg total) by mouth every 6 (six) hours as needed.  . traZODone (DESYREL) 50 MG tablet Take 0.5-1 tablets (25-50 mg total) by mouth at bedtime as needed for sleep.   No current facility-administered medications on file prior to visit.    Allergies:  No Known Allergies  Social History:  Social History   Socioeconomic History  . Marital status: Married    Spouse name: Not on  file  . Number of children: Not on file  . Years of education: Not on file  . Highest education level: Not on file  Occupational History  . Not on file  Tobacco Use  . Smoking status: Never Smoker  . Smokeless tobacco: Never Used  Vaping Use  . Vaping Use: Never used  Substance and Sexual Activity  . Alcohol use: Yes    Alcohol/week: 1.0 standard drink    Types: 1 Glasses of wine per week    Comment: occasional  . Drug use: No  . Sexual activity: Yes  Other Topics Concern  . Not on file  Social History Narrative  . Not on file   Social Determinants of Health   Financial Resource Strain: Not on file  Food Insecurity: Not on file  Transportation Needs: Not on file  Physical Activity: Not on file  Stress: Not on file  Social Connections: Not on file  Intimate Partner Violence: Not on file   Social  History   Tobacco Use  Smoking Status Never Smoker  Smokeless Tobacco Never Used   Social History   Substance and Sexual Activity  Alcohol Use Yes  . Alcohol/week: 1.0 standard drink  . Types: 1 Glasses of wine per week   Comment: occasional    Family History:  Family History  Adopted: Yes  Problem Relation Age of Onset  . Breast cancer Neg Hx     Past medical history, surgical history, medications, allergies, family history and social history reviewed with patient today and changes made to appropriate areas of the chart.   ROS All other ROS negative except what is listed above and in the HPI.      Objective:    BP 104/73   Pulse 87   Temp 97.7 F (36.5 C) (Oral)   Ht 5' 8.43" (1.738 m)   Wt 201 lb (91.2 kg)   SpO2 100%   BMI 30.18 kg/m   Wt Readings from Last 3 Encounters:  06/24/20 201 lb (91.2 kg)  12/12/19 196 lb 12.8 oz (89.3 kg)  07/29/19 193 lb (87.5 kg)    Physical Exam Vitals and nursing note reviewed.  Constitutional:      General: She is awake. She is not in acute distress.    Appearance: She is well-developed and well-groomed. She is obese. She is not ill-appearing.  HENT:     Head: Normocephalic and atraumatic.     Right Ear: Hearing, tympanic membrane, ear canal and external ear normal. No drainage.     Left Ear: Hearing, tympanic membrane, ear canal and external ear normal. No drainage.     Nose: Nose normal.     Right Sinus: No maxillary sinus tenderness or frontal sinus tenderness.     Left Sinus: No maxillary sinus tenderness or frontal sinus tenderness.     Mouth/Throat:     Mouth: Mucous membranes are moist.     Pharynx: Oropharynx is clear. Uvula midline. No pharyngeal swelling, oropharyngeal exudate or posterior oropharyngeal erythema.  Eyes:     General: Lids are normal.        Right eye: No discharge.        Left eye: No discharge.     Extraocular Movements: Extraocular movements intact.     Conjunctiva/sclera: Conjunctivae  normal.     Pupils: Pupils are equal, round, and reactive to light.     Visual Fields: Right eye visual fields normal and left eye visual fields normal.  Neck:  Thyroid: No thyromegaly.     Vascular: No carotid bruit.     Trachea: Trachea normal.  Cardiovascular:     Rate and Rhythm: Normal rate and regular rhythm.     Heart sounds: Normal heart sounds. No murmur heard. No gallop.   Pulmonary:     Effort: Pulmonary effort is normal. No accessory muscle usage or respiratory distress.     Breath sounds: Normal breath sounds.  Abdominal:     General: Bowel sounds are normal.     Palpations: Abdomen is soft. There is no hepatomegaly or splenomegaly.     Tenderness: There is no abdominal tenderness.  Musculoskeletal:        General: Normal range of motion.     Cervical back: Normal range of motion and neck supple.     Right lower leg: No edema.     Left lower leg: No edema.  Lymphadenopathy:     Head:     Right side of head: No submental, submandibular, tonsillar, preauricular or posterior auricular adenopathy.     Left side of head: No submental, submandibular, tonsillar, preauricular or posterior auricular adenopathy.     Cervical: No cervical adenopathy.  Skin:    General: Skin is warm and dry.     Capillary Refill: Capillary refill takes less than 2 seconds.     Findings: No rash.  Neurological:     Mental Status: She is alert and oriented to person, place, and time.     Cranial Nerves: Cranial nerves are intact.     Gait: Gait is intact.     Deep Tendon Reflexes: Reflexes are normal and symmetric.     Reflex Scores:      Brachioradialis reflexes are 2+ on the right side and 2+ on the left side.      Patellar reflexes are 2+ on the right side and 2+ on the left side. Psychiatric:        Attention and Perception: Attention normal.        Mood and Affect: Mood normal.        Speech: Speech normal.        Behavior: Behavior normal. Behavior is cooperative.        Thought  Content: Thought content normal.        Judgment: Judgment normal.    Results for orders placed or performed in visit on 07/29/19  Comprehensive metabolic panel  Result Value Ref Range   Glucose 93 65 - 99 mg/dL   BUN 7 6 - 24 mg/dL   Creatinine, Ser 0.91 0.57 - 1.00 mg/dL   GFR calc non Af Amer 74 >59 mL/min/1.73   GFR calc Af Amer 85 >59 mL/min/1.73   BUN/Creatinine Ratio 8 (L) 9 - 23   Sodium 137 134 - 144 mmol/L   Potassium 4.2 3.5 - 5.2 mmol/L   Chloride 98 96 - 106 mmol/L   CO2 23 20 - 29 mmol/L   Calcium 9.9 8.7 - 10.2 mg/dL   Total Protein 7.4 6.0 - 8.5 g/dL   Albumin 4.7 3.8 - 4.8 g/dL   Globulin, Total 2.7 1.5 - 4.5 g/dL   Albumin/Globulin Ratio 1.7 1.2 - 2.2   Bilirubin Total 0.7 0.0 - 1.2 mg/dL   Alkaline Phosphatase 129 (H) 39 - 117 IU/L   AST 73 (H) 0 - 40 IU/L   ALT 76 (H) 0 - 32 IU/L  Gamma GT  Result Value Ref Range   GGT 90 (H) 0 - 60 IU/L  Assessment & Plan:   Problem List Items Addressed This Visit   None   Visit Diagnoses    Encounter for annual physical exam    -  Primary   Annual labs today to include CBC, CMP, TSH, lipid.  Health maintenance reviewed with patient.   Relevant Orders   CBC with Differential/Platelet   Comprehensive metabolic panel   Lipid Panel w/o Chol/HDL Ratio   TSH   Thyroid disorder screening       TSH on labs today   Screening for cholesterol level       Lipid panel on labs today.   Need for Tdap vaccination       TDAP provided in office today   Relevant Orders   Tdap vaccine greater than or equal to 7yo IM   Need for hepatitis C screening test       Hep C screening on labs today   Relevant Orders   Hepatitis C antibody, reflex       Follow up plan: Return in about 6 months (around 12/22/2020) for HTN and MOOD.   LABORATORY TESTING:  - Pap smear: up to date  IMMUNIZATIONS:   - Tdap: Tetanus vaccination status reviewed: last tetanus booster within 10 years, Td vaccination indicated and given today. -  Influenza: Refused - Pneumovax: Not applicable - Prevnar: Not applicable - HPV: Not applicable - Zostavax vaccine: wishes to think about it  SCREENING: -Mammogram: Up to date  - Colonoscopy: Up to date  - Bone Density: Not applicable  -Hearing Test: Not applicable  -Spirometry: Not applicable   PATIENT COUNSELING:   Advised to take 1 mg of folate supplement per day if capable of pregnancy.   Sexuality: Discussed sexually transmitted diseases, partner selection, use of condoms, avoidance of unintended pregnancy  and contraceptive alternatives.   Advised to avoid cigarette smoking.  I discussed with the patient that most people either abstain from alcohol or drink within safe limits (<=14/week and <=4 drinks/occasion for males, <=7/weeks and <= 3 drinks/occasion for females) and that the risk for alcohol disorders and other health effects rises proportionally with the number of drinks per week and how often a drinker exceeds daily limits.  Discussed cessation/primary prevention of drug use and availability of treatment for abuse.   Diet: Encouraged to adjust caloric intake to maintain  or achieve ideal body weight, to reduce intake of dietary saturated fat and total fat, to limit sodium intake by avoiding high sodium foods and not adding table salt, and to maintain adequate dietary potassium and calcium preferably from fresh fruits, vegetables, and low-fat dairy products.    stressed the importance of regular exercise  Injury prevention: Discussed safety belts, safety helmets, smoke detector, smoking near bedding or upholstery.   Dental health: Discussed importance of regular tooth brushing, flossing, and dental visits.    NEXT PREVENTATIVE PHYSICAL DUE IN 1 YEAR. Return in about 6 months (around 12/22/2020) for HTN and MOOD.

## 2020-06-24 NOTE — Patient Instructions (Signed)

## 2020-06-25 ENCOUNTER — Other Ambulatory Visit: Payer: Self-pay | Admitting: Nurse Practitioner

## 2020-06-25 DIAGNOSIS — D72820 Lymphocytosis (symptomatic): Secondary | ICD-10-CM

## 2020-06-25 DIAGNOSIS — R7989 Other specified abnormal findings of blood chemistry: Secondary | ICD-10-CM

## 2020-06-25 LAB — COMPREHENSIVE METABOLIC PANEL
ALT: 75 IU/L — ABNORMAL HIGH (ref 0–32)
AST: 63 IU/L — ABNORMAL HIGH (ref 0–40)
Albumin/Globulin Ratio: 1.5 (ref 1.2–2.2)
Albumin: 4.7 g/dL (ref 3.8–4.9)
Alkaline Phosphatase: 132 IU/L — ABNORMAL HIGH (ref 44–121)
BUN/Creatinine Ratio: 13 (ref 9–23)
BUN: 15 mg/dL (ref 6–24)
Bilirubin Total: 1 mg/dL (ref 0.0–1.2)
CO2: 23 mmol/L (ref 20–29)
Calcium: 10 mg/dL (ref 8.7–10.2)
Chloride: 95 mmol/L — ABNORMAL LOW (ref 96–106)
Creatinine, Ser: 1.15 mg/dL — ABNORMAL HIGH (ref 0.57–1.00)
GFR calc Af Amer: 64 mL/min/{1.73_m2} (ref >59)
GFR calc non Af Amer: 55 mL/min/{1.73_m2} — ABNORMAL LOW (ref >59)
Globulin, Total: 3.1 g/dL (ref 1.5–4.5)
Glucose: 79 mg/dL (ref 65–99)
Potassium: 3.8 mmol/L (ref 3.5–5.2)
Sodium: 137 mmol/L (ref 134–144)
Total Protein: 7.8 g/dL (ref 6.0–8.5)

## 2020-06-25 LAB — CBC WITH DIFFERENTIAL/PLATELET
Basophils Absolute: 0.1 10*3/uL (ref 0.0–0.2)
Basos: 1 %
EOS (ABSOLUTE): 0.3 10*3/uL (ref 0.0–0.4)
Eos: 3 %
Hematocrit: 44.4 % (ref 34.0–46.6)
Hemoglobin: 14.5 g/dL (ref 11.1–15.9)
Immature Grans (Abs): 0 10*3/uL (ref 0.0–0.1)
Immature Granulocytes: 0 %
Lymphocytes Absolute: 2.7 10*3/uL (ref 0.7–3.1)
Lymphs: 24 %
MCH: 30.5 pg (ref 26.6–33.0)
MCHC: 32.7 g/dL (ref 31.5–35.7)
MCV: 94 fL (ref 79–97)
Monocytes Absolute: 0.8 10*3/uL (ref 0.1–0.9)
Monocytes: 7 %
Neutrophils Absolute: 7.2 10*3/uL — ABNORMAL HIGH (ref 1.4–7.0)
Neutrophils: 65 %
Platelets: 364 10*3/uL (ref 150–450)
RBC: 4.75 x10E6/uL (ref 3.77–5.28)
RDW: 12.4 % (ref 11.7–15.4)
WBC: 11 10*3/uL — ABNORMAL HIGH (ref 3.4–10.8)

## 2020-06-25 LAB — LIPID PANEL W/O CHOL/HDL RATIO
Cholesterol, Total: 230 mg/dL — ABNORMAL HIGH (ref 100–199)
HDL: 68 mg/dL (ref >39)
LDL Chol Calc (NIH): 143 mg/dL — ABNORMAL HIGH (ref 0–99)
Triglycerides: 109 mg/dL (ref 0–149)
VLDL Cholesterol Cal: 19 mg/dL (ref 5–40)

## 2020-06-25 LAB — TSH: TSH: 1.22 u[IU]/mL (ref 0.450–4.500)

## 2020-06-25 NOTE — Progress Notes (Signed)
Contacted via MyChart The 10-year ASCVD risk score Mikey Bussing DC Jr., et al., 2013) is: 1.1%   Values used to calculate the score:     Age: 50 years     Sex: Female     Is Non-Hispanic African American: No     Diabetic: No     Tobacco smoker: No     Systolic Blood Pressure: 122 mmHg     Is BP treated: Yes     HDL Cholesterol: 68 mg/dL     Total Cholesterol: 230 mg/dL   Good evening Alexandra Le, your labs have returned: - Thyroid testing is normal - Cholesterol levels remain elevated, but overall calculated heart risk score is 1.1%, so continue to focus on healthy diet choices and regular exercise. - Kidney function had elevation in creatinine and mild reduction in GFR -- please ensure you are drinking a lot of water daily and we will recheck this next labs -- avoid products containing Ibuprofen, as this can affect kidneys. - Liver functions tests (AST, ALT, and Alkaline Phosphatase) continue to be mildly elevated.  Do you drink any alcohol regularly or take frequent Tylenol?  If so please cut back on both and we will recheck next labs. - Your white blood cell count and neutrophils were very mildly elevated, has you been sick recently?  I would like to recheck this, along with kidney and liver function via outpatient lab visit only in 4 weeks -- please schedule this.  Any questions? Keep being awesome!!  Thank you for allowing me to participate in your care. Kindest regards, Ayelet Gruenewald

## 2020-09-10 ENCOUNTER — Ambulatory Visit: Payer: BC Managed Care – PPO | Admitting: Nurse Practitioner

## 2020-09-14 ENCOUNTER — Ambulatory Visit: Payer: BC Managed Care – PPO | Admitting: Nurse Practitioner

## 2020-10-01 ENCOUNTER — Encounter: Payer: BC Managed Care – PPO | Admitting: Nurse Practitioner

## 2020-10-01 ENCOUNTER — Other Ambulatory Visit: Payer: Self-pay

## 2020-10-01 ENCOUNTER — Other Ambulatory Visit: Payer: BC Managed Care – PPO

## 2020-10-01 VITALS — Wt 195.2 lb

## 2020-10-01 DIAGNOSIS — R7989 Other specified abnormal findings of blood chemistry: Secondary | ICD-10-CM

## 2020-10-01 DIAGNOSIS — D72820 Lymphocytosis (symptomatic): Secondary | ICD-10-CM

## 2020-10-01 NOTE — Progress Notes (Signed)
Error, lab only visit

## 2020-10-02 LAB — CBC WITH DIFFERENTIAL/PLATELET
Basophils Absolute: 0.1 10*3/uL (ref 0.0–0.2)
Basos: 1 %
EOS (ABSOLUTE): 0.3 10*3/uL (ref 0.0–0.4)
Eos: 3 %
Hematocrit: 42.9 % (ref 34.0–46.6)
Hemoglobin: 14.3 g/dL (ref 11.1–15.9)
Immature Grans (Abs): 0.1 10*3/uL (ref 0.0–0.1)
Immature Granulocytes: 1 %
Lymphocytes Absolute: 2.2 10*3/uL (ref 0.7–3.1)
Lymphs: 20 %
MCH: 30.6 pg (ref 26.6–33.0)
MCHC: 33.3 g/dL (ref 31.5–35.7)
MCV: 92 fL (ref 79–97)
Monocytes Absolute: 0.7 10*3/uL (ref 0.1–0.9)
Monocytes: 6 %
Neutrophils Absolute: 7.9 10*3/uL — ABNORMAL HIGH (ref 1.4–7.0)
Neutrophils: 69 %
Platelets: 353 10*3/uL (ref 150–450)
RBC: 4.68 x10E6/uL (ref 3.77–5.28)
RDW: 12.8 % (ref 11.7–15.4)
WBC: 11 10*3/uL — ABNORMAL HIGH (ref 3.4–10.8)

## 2020-10-02 LAB — COMPREHENSIVE METABOLIC PANEL
ALT: 49 IU/L — ABNORMAL HIGH (ref 0–32)
AST: 45 IU/L — ABNORMAL HIGH (ref 0–40)
Albumin/Globulin Ratio: 1.6 (ref 1.2–2.2)
Albumin: 4.6 g/dL (ref 3.8–4.9)
Alkaline Phosphatase: 129 IU/L — ABNORMAL HIGH (ref 44–121)
BUN/Creatinine Ratio: 8 — ABNORMAL LOW (ref 9–23)
BUN: 10 mg/dL (ref 6–24)
Bilirubin Total: 0.3 mg/dL (ref 0.0–1.2)
CO2: 27 mmol/L (ref 20–29)
Calcium: 9.7 mg/dL (ref 8.7–10.2)
Chloride: 95 mmol/L — ABNORMAL LOW (ref 96–106)
Creatinine, Ser: 1.2 mg/dL — ABNORMAL HIGH (ref 0.57–1.00)
Globulin, Total: 2.9 g/dL (ref 1.5–4.5)
Glucose: 83 mg/dL (ref 65–99)
Potassium: 4.3 mmol/L (ref 3.5–5.2)
Sodium: 138 mmol/L (ref 134–144)
Total Protein: 7.5 g/dL (ref 6.0–8.5)
eGFR: 55 mL/min/{1.73_m2} — ABNORMAL LOW (ref >59)

## 2020-10-02 LAB — GAMMA GT: GGT: 53 IU/L (ref 0–60)

## 2020-10-05 ENCOUNTER — Other Ambulatory Visit: Payer: Self-pay | Admitting: Nurse Practitioner

## 2020-10-05 DIAGNOSIS — R7989 Other specified abnormal findings of blood chemistry: Secondary | ICD-10-CM

## 2020-10-05 DIAGNOSIS — D729 Disorder of white blood cells, unspecified: Secondary | ICD-10-CM

## 2020-10-05 NOTE — Progress Notes (Signed)
Contacted via MyChart   Good afternoon Alexandra Le, your labs have returned: - GGT is normal, this is good and helps look at gall bladder - Liver function tests, AST and ALT + alkaline phosphatase, are trending down.  Continue diet focus and we will recheck next visit. - CBC continues to show mild elevation in white blood cell count and neutrophils, have you been sick recently? Had Covid?  Taken any Prednisone?  If none of these apply I would like to recheck this via outpatient labs in 4 weeks, please schedule. 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  I think you would benefit from stopping Hydrochlorothiazide since recent blood pressures have been stable -- then recheck this next visit.  Also ensure good water intake.  Any questions? Keep being awesome!!  Thank you for allowing me to participate in your care.  I appreciate you. Kindest regards, Kyrstyn Greear

## 2020-10-25 ENCOUNTER — Telehealth: Payer: BC Managed Care – PPO | Admitting: Physician Assistant

## 2020-10-25 DIAGNOSIS — J069 Acute upper respiratory infection, unspecified: Secondary | ICD-10-CM

## 2020-10-25 DIAGNOSIS — B9689 Other specified bacterial agents as the cause of diseases classified elsewhere: Secondary | ICD-10-CM

## 2020-10-25 MED ORDER — AMOXICILLIN 500 MG PO CAPS: 500.0000 mg | ORAL_CAPSULE | Freq: Two times a day (BID) | ORAL | 0 refills | Status: AC

## 2020-10-25 NOTE — Progress Notes (Signed)
I have spent 5 minutes in review of e-visit questionnaire, review and updating patient chart, medical decision making and response to patient.   Thaila Bottoms Cody Giovonnie Trettel, PA-C    

## 2020-10-25 NOTE — Progress Notes (Signed)
We are sorry that you are not feeling well.  Here is how we plan to help!  Based on your presentation I believe you most likely have  a bacterial URI. Giving worsening of throat pain along with this, I am going to start you on antibiotics. I have sent in a script for Amoxicillin 500 mg twice daily for 10 days.   In addition you may use A non-prescription cough medication called Robitussin DAC. Take 2 teaspoons every 8 hours or Delsym: take 2 teaspoons every 12 hours.  From your responses in the eVisit questionnaire you describe inflammation in the upper respiratory tract which is causing a significant cough.  This is commonly called Bronchitis and has four common causes:   Allergies Viral Infections Acid Reflux Bacterial Infection Allergies, viruses and acid reflux are treated by controlling symptoms or eliminating the cause. An example might be a cough caused by taking certain blood pressure medications. You stop the cough by changing the medication. Another example might be a cough caused by acid reflux. Controlling the reflux helps control the cough.  USE OF BRONCHODILATOR ("RESCUE") INHALERS: There is a risk from using your bronchodilator too frequently.  The risk is that over-reliance on a medication which only relaxes the muscles surrounding the breathing tubes can reduce the effectiveness of medications prescribed to reduce swelling and congestion of the tubes themselves.  Although you feel brief relief from the bronchodilator inhaler, your asthma may actually be worsening with the tubes becoming more swollen and filled with mucus.  This can delay other crucial treatments, such as oral steroid medications. If you need to use a bronchodilator inhaler daily, several times per day, you should discuss this with your provider.  There are probably better treatments that could be used to keep your asthma under control.     HOME CARE Only take medications as instructed by your medical team. Complete  the entire course of an antibiotic. Drink plenty of fluids and get plenty of rest. Avoid close contacts especially the very young and the elderly Cover your mouth if you cough or cough into your sleeve. Always remember to wash your hands A steam or ultrasonic humidifier can help congestion.   GET HELP RIGHT AWAY IF: You develop worsening fever. You become short of breath You cough up blood. Your symptoms persist after you have completed your treatment plan MAKE SURE YOU  Understand these instructions. Will watch your condition. Will get help right away if you are not doing well or get worse.  Your e-visit answers were reviewed by a board certified advanced clinical practitioner to complete your personal care plan.  Depending on the condition, your plan could have included both over the counter or prescription medications. If there is a problem please reply  once you have received a response from your provider. Your safety is important to [place].  If you have drug allergies check your prescription carefully.    You can use MyChart to ask questions about today's visit, request a non-urgent call back, or ask for a work or school excuse for 24 hours related to this e-Visit. If it has been greater than 24 hours you will need to follow up with your provider, or enter a new e-Visit to address those concerns. You will get an e-mail in the next two days asking about your experience.  I hope that your e-visit has been valuable and will speed your recovery. Thank you for using e-visits.

## 2021-01-14 ENCOUNTER — Ambulatory Visit: Payer: BC Managed Care – PPO | Admitting: Nurse Practitioner

## 2021-02-12 ENCOUNTER — Encounter: Payer: Self-pay | Admitting: Nurse Practitioner

## 2021-02-12 DIAGNOSIS — N1831 Chronic kidney disease, stage 3a: Secondary | ICD-10-CM | POA: Insufficient documentation

## 2021-02-12 DIAGNOSIS — E559 Vitamin D deficiency, unspecified: Secondary | ICD-10-CM | POA: Insufficient documentation

## 2021-02-12 DIAGNOSIS — D72829 Elevated white blood cell count, unspecified: Secondary | ICD-10-CM | POA: Insufficient documentation

## 2021-02-14 ENCOUNTER — Ambulatory Visit: Payer: BC Managed Care – PPO | Admitting: Nurse Practitioner

## 2021-02-14 DIAGNOSIS — N1831 Chronic kidney disease, stage 3a: Secondary | ICD-10-CM

## 2021-02-14 DIAGNOSIS — D72820 Lymphocytosis (symptomatic): Secondary | ICD-10-CM

## 2021-02-14 DIAGNOSIS — R7989 Other specified abnormal findings of blood chemistry: Secondary | ICD-10-CM

## 2021-02-14 DIAGNOSIS — E559 Vitamin D deficiency, unspecified: Secondary | ICD-10-CM

## 2021-02-14 DIAGNOSIS — I10 Essential (primary) hypertension: Secondary | ICD-10-CM

## 2021-02-14 NOTE — Telephone Encounter (Signed)
Lmom asking pt to call back to reschedule appt. 

## 2021-02-15 NOTE — Telephone Encounter (Signed)
Lmom asking pt to call back to reschedule appt. This is my second attempt at calling pt

## 2021-02-18 NOTE — Telephone Encounter (Signed)
Pt is rescheduled.

## 2021-03-06 DIAGNOSIS — Z6827 Body mass index (BMI) 27.0-27.9, adult: Secondary | ICD-10-CM | POA: Insufficient documentation

## 2021-03-06 DIAGNOSIS — E669 Obesity, unspecified: Secondary | ICD-10-CM | POA: Insufficient documentation

## 2021-03-07 ENCOUNTER — Other Ambulatory Visit: Payer: Self-pay

## 2021-03-07 ENCOUNTER — Ambulatory Visit (INDEPENDENT_AMBULATORY_CARE_PROVIDER_SITE_OTHER): Payer: BC Managed Care – PPO | Admitting: Nurse Practitioner

## 2021-03-07 ENCOUNTER — Encounter: Payer: Self-pay | Admitting: Nurse Practitioner

## 2021-03-07 VITALS — BP 122/76 | HR 78 | Temp 98.6°F | Wt 198.6 lb

## 2021-03-07 DIAGNOSIS — D72820 Lymphocytosis (symptomatic): Secondary | ICD-10-CM

## 2021-03-07 DIAGNOSIS — I10 Essential (primary) hypertension: Secondary | ICD-10-CM

## 2021-03-07 DIAGNOSIS — N1831 Chronic kidney disease, stage 3a: Secondary | ICD-10-CM | POA: Diagnosis not present

## 2021-03-07 DIAGNOSIS — F5101 Primary insomnia: Secondary | ICD-10-CM

## 2021-03-07 DIAGNOSIS — R7989 Other specified abnormal findings of blood chemistry: Secondary | ICD-10-CM | POA: Diagnosis not present

## 2021-03-07 DIAGNOSIS — F411 Generalized anxiety disorder: Secondary | ICD-10-CM

## 2021-03-07 DIAGNOSIS — Z23 Encounter for immunization: Secondary | ICD-10-CM

## 2021-03-07 DIAGNOSIS — F332 Major depressive disorder, recurrent severe without psychotic features: Secondary | ICD-10-CM | POA: Diagnosis not present

## 2021-03-07 DIAGNOSIS — Z6830 Body mass index (BMI) 30.0-30.9, adult: Secondary | ICD-10-CM

## 2021-03-07 DIAGNOSIS — E559 Vitamin D deficiency, unspecified: Secondary | ICD-10-CM

## 2021-03-07 DIAGNOSIS — E66811 Obesity, class 1: Secondary | ICD-10-CM

## 2021-03-07 DIAGNOSIS — E7800 Pure hypercholesterolemia, unspecified: Secondary | ICD-10-CM

## 2021-03-07 DIAGNOSIS — E6609 Other obesity due to excess calories: Secondary | ICD-10-CM

## 2021-03-07 DIAGNOSIS — Z1159 Encounter for screening for other viral diseases: Secondary | ICD-10-CM

## 2021-03-07 LAB — MICROALBUMIN, URINE WAIVED
Creatinine, Urine Waived: 50 mg/dL (ref 10–300)
Microalb, Ur Waived: 10 mg/L (ref 0–19)

## 2021-03-07 NOTE — Assessment & Plan Note (Signed)
Noted on past labs, recheck today and adjust supplement as needed. 

## 2021-03-07 NOTE — Assessment & Plan Note (Signed)
Refer to depression POC. 

## 2021-03-07 NOTE — Assessment & Plan Note (Signed)
Noted on labs in May 2022, urine ALB today 10.  Continue Benazepril for kidney protection.  Recommend increased water intake at home and decrease Ibuprofen use.  Recheck CMP today and CBC.

## 2021-03-07 NOTE — Assessment & Plan Note (Signed)
Chronic, ongoing.  Continue Trazodone. Refills sent. Recommend focus on healthy sleep hygiene.  Return in 6 months.

## 2021-03-07 NOTE — Progress Notes (Signed)
BP 122/76   Pulse 78   Temp 98.6 F (37 C) (Oral)   Wt 198 lb 9.6 oz (90.1 kg)   LMP 03/06/2021 (Exact Date)   SpO2 98%   BMI 29.82 kg/m    Subjective:    Patient ID: Alexandra Le, female    DOB: 10-23-1970, 50 y.o.   MRN: [id number]  HPI: Alexandra Le is a 50 y.o. female  Chief Complaint  Patient presents with   Depression   Weight Loss    Pt states she wants to discuss weight loss options   HYPERTENSION Continues on Benazepril and HCTZ.  Last labs in May noted mild CKD 3 == was taking Ibuprofen almost every day, at this time has not taken in 4 days.  CRT 1.20, eGFR 55. Hypertension status: stable  Satisfied with current treatment? yes Duration of hypertension: chronic BP monitoring frequency:  a few times a month BP range: 120/70 range BP medication side effects:  no Medication compliance: good compliance Previous BP meds: as above Aspirin: no Recurrent headaches: no Visual changes: no Palpitations: no Dyspnea: no Chest pain: no Lower extremity edema: no Dizzy/lightheaded: no   DEPRESSION Currently taking Trazodone and Effexor.  Has had elevations in LFT, last in May AST/ALT 45/49, ALK PHOS 129, GGT normal, WBC 11.  Weight loss is a struggle, her goal weight is 150.  She is having issues losing.  Diet at home, eating a lot of greens and healthy meals.  Has reduced breads, does love pasta.  Is exercising frequently during the week.  Is interested in Phentermine.   Mood status: stable Satisfied with current treatment?: yes Symptom severity: moderate  Duration of current treatment : chronic Side effects: no Medication compliance: good compliance Psychotherapy/counseling: none Previous psychiatric medications: Wellbutrin and above Depressed mood: no Anxious mood: no Anhedonia: no Significant weight loss or gain: yes Insomnia: none Fatigue: no Feelings of worthlessness or guilt: no Impaired concentration/indecisiveness: no Suicidal  ideations: no Hopelessness: no Crying spells: no Depression screen [facility] 2/9 03/07/2021 06/24/2020 12/12/2019 01/17/2019 01/03/2019  Decreased Interest 0 0 0 1 0  Down, Depressed, Hopeless 3 0 0 0 0  PHQ - 2 Score 3 0 0 1 0  Altered sleeping 0 0 _0 Tired, decreased energy 0 0 _1 Change in appetite 0 0 _2 Feeling bad or failure about yourself  0 0 0 0 0  Trouble concentrating 0 0 _3 Moving slowly or fidgety/restless 0 0 0 1 1  Suicidal thoughts 0 0 0 0 0  PHQ-9 Score 3 0 _4 Difficult doing work/chores Not difficult at all - Not difficult at all Somewhat difficult Somewhat difficult  Some recent data might be hidden     Relevant past medical, surgical, family and social history reviewed and updated as indicated. Interim medical history since our last visit reviewed. Allergies and medications reviewed and updated.  Review of Systems  Constitutional:  Negative for activity change, appetite change, diaphoresis, fatigue and fever.  Respiratory:  Negative for cough, chest tightness and shortness of breath.   Cardiovascular:  Negative for chest pain, palpitations and leg swelling.  Gastrointestinal: Negative.   Neurological: Negative.   Psychiatric/Behavioral:  Negative for decreased concentration, self-injury, sleep disturbance and suicidal ideas. The patient is not nervous/anxious.    Per HPI unless specifically indicated above     Objective:    BP 122/76   Pulse 78  Temp 98.6 F (37 C) (Oral)   Wt 198 lb 9.6 oz (90.1 kg)   LMP 03/06/2021 (Exact Date)   SpO2 98%   BMI 29.82 kg/m   Wt Readings from Last 3 Encounters:  03/07/21 198 lb 9.6 oz (90.1 kg)  10/01/20 195 lb 3.2 oz (88.5 kg)  06/24/20 201 lb (91.2 kg)    Physical Exam Vitals and nursing note reviewed.  Constitutional:      Appearance: She is well-developed.  HENT:     Head: Normocephalic.  Eyes:     General:        Right eye: No discharge.        Left eye: No discharge.      Conjunctiva/sclera: Conjunctivae normal.     Pupils: Pupils are equal, round, and reactive to light.  Neck:     Thyroid: No thyromegaly.     Vascular: No carotid bruit or JVD.  Cardiovascular:     Rate and Rhythm: Normal rate and regular rhythm.     Heart sounds: Normal heart sounds. No murmur heard.   No gallop.  Pulmonary:     Effort: Pulmonary effort is normal.     Breath sounds: Normal breath sounds.  Abdominal:     General: Bowel sounds are normal. There is no distension.     Palpations: Abdomen is soft.     Tenderness: There is no abdominal tenderness.  Musculoskeletal:     Cervical back: Normal range of motion and neck supple.     Right lower leg: No edema.     Left lower leg: No edema.  Lymphadenopathy:     Cervical: No cervical adenopathy.  Skin:    General: Skin is warm and dry.  Neurological:     Mental Status: She is alert and oriented to person, place, and time.  Psychiatric:        Attention and Perception: Attention normal.        Mood and Affect: Mood normal.        Speech: Speech normal.        Behavior: Behavior normal.        Thought Content: Thought content normal.    Results for orders placed or performed in visit on 03/07/21  Microalbumin, Urine Waived  Result Value Ref Range   Microalb, Ur Waived 10 0 - 19 mg/L   Creatinine, Urine Waived 50 10 - 300 mg/dL   Microalb/Creat Ratio 30-300 (H) <30 mg/g      Assessment & Plan:   Problem List Items Addressed This Visit       Cardiovascular and Mediastinum   Hypertension    Chronic, ongoing.  BP at goal today.  Continue current medication regimen, HCTZ and Benazepril, adjust as needed.  Could consider addition of BB if return of palpitations.  Recommend she monitor BP at home at least a few days a week and document.  DASH diet recommended.  CMP, CBC, lipid today.  Urine ALB 10.      Relevant Orders   Comprehensive metabolic panel   Microalbumin, Urine Waived (Completed)     Genitourinary    Chronic kidney disease, stage 3a ([place])    Noted on labs in May 2022, urine ALB today 10.  Continue Benazepril for kidney protection.  Recommend increased water intake at home and decrease Ibuprofen use.  Recheck CMP today and CBC.      Relevant Orders   Comprehensive metabolic panel   Microalbumin, Urine Waived (Completed)     Other  Generalized anxiety disorder    Refer to depression POC.      Depression - Primary    Chronic, ongoing.  Will continue current medication regimen and adjust as needed.   Denies SI/HI.  Return to office in 6 months.      Insomnia    Chronic, ongoing.  Continue Trazodone. Refills sent. Recommend focus on healthy sleep hygiene.  Return in 6 months.      Elevated LFTs    Noted on past labs, trending down.  Recheck today and if ongoing elevations then consider imaging as does endorse occasional RUQ discomfort.  CMP, CBC, GGT today.      Relevant Orders   CBC with Differential/Platelet   Comprehensive metabolic panel   Hepatitis C antibody   Gamma GT   Elevated white blood cell count    Noted on past labs today, recheck CBC.      Relevant Orders   CBC with Differential/Platelet   Vitamin D deficiency    Noted on past labs, recheck today and adjust supplement as needed.      Relevant Orders   VITAMIN D 25 Hydroxy (Vit-D Deficiency, Fractures)   Obesity    BMI 29.82, would like to lose weight.  Referral to weight management.      Relevant Orders   Ambulatory referral to Gynecology   Elevated low density lipoprotein (LDL) cholesterol level    Recheck lipid panel today and continue diet and exercise focus.      Relevant Orders   Lipid Panel w/o Chol/HDL Ratio   Other Visit Diagnoses     Need for hepatitis C screening test       Hep C screening on labs, discussed with patient.   Relevant Orders   Hepatitis C antibody   Need for influenza vaccination       Flu vaccine today.   Relevant Orders   Flu Vaccine QUAD 69moIM (Fluarix,  Fluzone & Alfiuria Quad PF) (Completed)        Follow up plan: Return in about 6 months (around 09/04/2021) for HTN/HLD, MOOD.

## 2021-03-07 NOTE — Assessment & Plan Note (Signed)
Chronic, ongoing.  Will continue current medication regimen and adjust as needed.   Denies SI/HI.  Return to office in 6 months.

## 2021-03-07 NOTE — Assessment & Plan Note (Addendum)
Chronic, ongoing.  BP at goal today.  Continue current medication regimen, HCTZ and Benazepril, adjust as needed.  Could consider addition of BB if return of palpitations.  Recommend she monitor BP at home at least a few days a week and document.  DASH diet recommended.  CMP, CBC, lipid today.  Urine ALB 10.

## 2021-03-07 NOTE — Assessment & Plan Note (Signed)
Noted on past labs today, recheck CBC.

## 2021-03-07 NOTE — Assessment & Plan Note (Signed)
BMI 29.82, would like to lose weight.  Referral to weight management.

## 2021-03-07 NOTE — Assessment & Plan Note (Signed)
Noted on past labs, trending down.  Recheck today and if ongoing elevations then consider imaging as does endorse occasional RUQ discomfort.  CMP, CBC, GGT today.

## 2021-03-07 NOTE — Assessment & Plan Note (Signed)
Recheck lipid panel today and continue diet and exercise focus.

## 2021-03-08 LAB — LIPID PANEL W/O CHOL/HDL RATIO
Cholesterol, Total: 235 mg/dL — ABNORMAL HIGH (ref 100–199)
HDL: 56 mg/dL (ref >39)
LDL Chol Calc (NIH): 125 mg/dL — ABNORMAL HIGH (ref 0–99)
Triglycerides: 310 mg/dL — ABNORMAL HIGH (ref 0–149)
VLDL Cholesterol Cal: 54 mg/dL — ABNORMAL HIGH (ref 5–40)

## 2021-03-08 LAB — GAMMA GT: GGT: 42 IU/L (ref 0–60)

## 2021-03-08 LAB — CBC WITH DIFFERENTIAL/PLATELET
Basophils Absolute: 0.1 10*3/uL (ref 0.0–0.2)
Basos: 1 %
EOS (ABSOLUTE): 0.3 10*3/uL (ref 0.0–0.4)
Eos: 4 %
Hematocrit: 39.5 % (ref 34.0–46.6)
Hemoglobin: 12.7 g/dL (ref 11.1–15.9)
Immature Grans (Abs): 0 10*3/uL (ref 0.0–0.1)
Immature Granulocytes: 0 %
Lymphocytes Absolute: 2.2 10*3/uL (ref 0.7–3.1)
Lymphs: 33 %
MCH: 29.9 pg (ref 26.6–33.0)
MCHC: 32.2 g/dL (ref 31.5–35.7)
MCV: 93 fL (ref 79–97)
Monocytes Absolute: 0.4 10*3/uL (ref 0.1–0.9)
Monocytes: 6 %
Neutrophils Absolute: 3.6 10*3/uL (ref 1.4–7.0)
Neutrophils: 56 %
Platelets: 352 10*3/uL (ref 150–450)
RBC: 4.25 x10E6/uL (ref 3.77–5.28)
RDW: 13.5 % (ref 11.7–15.4)
WBC: 6.5 10*3/uL (ref 3.4–10.8)

## 2021-03-08 LAB — COMPREHENSIVE METABOLIC PANEL
ALT: 48 IU/L — ABNORMAL HIGH (ref 0–32)
AST: 35 IU/L (ref 0–40)
Albumin/Globulin Ratio: 1.6 (ref 1.2–2.2)
Albumin: 4.5 g/dL (ref 3.8–4.9)
Alkaline Phosphatase: 129 IU/L — ABNORMAL HIGH (ref 44–121)
BUN/Creatinine Ratio: 11 (ref 9–23)
BUN: 9 mg/dL (ref 6–24)
Bilirubin Total: 0.4 mg/dL (ref 0.0–1.2)
CO2: 25 mmol/L (ref 20–29)
Calcium: 9 mg/dL (ref 8.7–10.2)
Chloride: 102 mmol/L (ref 96–106)
Creatinine, Ser: 0.85 mg/dL (ref 0.57–1.00)
Globulin, Total: 2.8 g/dL (ref 1.5–4.5)
Glucose: 92 mg/dL (ref 70–99)
Potassium: 4.2 mmol/L (ref 3.5–5.2)
Sodium: 141 mmol/L (ref 134–144)
Total Protein: 7.3 g/dL (ref 6.0–8.5)
eGFR: 83 mL/min/{1.73_m2} (ref >59)

## 2021-03-08 LAB — HEPATITIS C ANTIBODY: Hep C Virus Ab: <0.1 s/co ratio (ref 0.0–0.9)

## 2021-03-08 LAB — VITAMIN D 25 HYDROXY (VIT D DEFICIENCY, FRACTURES): Vit D, 25-Hydroxy: 31.1 ng/mL (ref 30.0–100.0)

## 2021-03-08 NOTE — Progress Notes (Signed)
Contacted via Page The 10-year ASCVD risk score (Arnett DK, et al., 2019) is: 2%   Values used to calculate the score:     Age: 50 years     Sex: Female     Is Non-Hispanic African American: No     Diabetic: No     Tobacco smoker: No     Systolic Blood Pressure: 469 mmHg     Is BP treated: Yes     HDL Cholesterol: 56 mg/dL     Total Cholesterol: 235 mg/dL   Good morning Alexandra Le, your labs have returned: - CBC shows no anemia - CMP shows normal kidney function, creatinine and eGFR.  Liver function continues to show mild elevation in Alkaline Phosphatase and ALT, but AST is normal -- there is trend down.  GGT is normal.  I would still like to order an ultrasound of area to check on gall bladder.  I will order this today and they should call to schedule with you. - Vitamin D is normal with supplement on board. - Hep C is negative - Cholesterol levels are elevated, recommend continued focus on healthy diet and regular exercise.  Any questions? Keep being amazing!!  Thank you for allowing me to participate in your care.  I appreciate you. Kindest regards, Shelli Portilla

## 2021-03-08 NOTE — Addendum Note (Signed)
Addended by: Marnee Guarneri T on: 03/08/2021 09:45 AM   Modules accepted: Orders

## 2021-03-12 ENCOUNTER — Other Ambulatory Visit: Payer: Self-pay

## 2021-03-12 ENCOUNTER — Encounter: Payer: Self-pay | Admitting: Emergency Medicine

## 2021-03-12 ENCOUNTER — Emergency Department
Admission: EM | Admit: 2021-03-12 | Discharge: 2021-03-12 | Disposition: A | Discharge status: Home / Self Care | Payer: BC Managed Care – PPO | Attending: Student | Admitting: Student

## 2021-03-12 DIAGNOSIS — I129 Hypertensive chronic kidney disease with stage 1 through stage 4 chronic kidney disease, or unspecified chronic kidney disease: Secondary | ICD-10-CM | POA: Insufficient documentation

## 2021-03-12 DIAGNOSIS — W231XXA Caught, crushed, jammed, or pinched between stationary objects, initial encounter: Secondary | ICD-10-CM | POA: Insufficient documentation

## 2021-03-12 DIAGNOSIS — N1831 Chronic kidney disease, stage 3a: Secondary | ICD-10-CM | POA: Insufficient documentation

## 2021-03-12 DIAGNOSIS — S91312A Laceration without foreign body, left foot, initial encounter: Secondary | ICD-10-CM | POA: Diagnosis present

## 2021-03-12 DIAGNOSIS — S91311A Laceration without foreign body, right foot, initial encounter: Secondary | ICD-10-CM

## 2021-03-12 MED ORDER — LIDOCAINE HCL (PF) 1 % IJ SOLN
5.0000 mL | Freq: Once | INTRAMUSCULAR | Status: AC
  Administered 2021-03-12: 5 mL via INTRADERMAL
  Filled 2021-03-12: qty 5

## 2021-03-12 MED ORDER — BACITRACIN ZINC 500 UNIT/GM EX OINT
1.0000 "application " | TOPICAL_OINTMENT | Freq: Once | CUTANEOUS | Status: AC
  Administered 2021-03-12: 1 via TOPICAL
  Filled 2021-03-12: qty 0.9

## 2021-03-12 NOTE — ED Notes (Signed)
Patient stable and discharged with all personal belongings and AVS. AVS and discharge instructions reviewed with patient and opportunity for questions provided.   

## 2021-03-12 NOTE — ED Notes (Signed)
Wound cleansed with saline, dressed with bacitracin ointment per order.

## 2021-03-12 NOTE — ED Provider Notes (Signed)
Emergency Medicine Provider Triage Evaluation Note  Alexandra Le , a 50 y.o. female  was evaluated in triage.  Pt complains of laceration upper heel region of left foot - Pt reports that she was coming out a laundramat door and it closed on her heel .  Review of Systems  Positive: Laceration to left heel Negative:   Physical Exam  BP (!) 164/83 (BP Location: Left Arm)   Pulse 75   Temp 98.5 F (36.9 C) (Oral)   Resp 20   Ht 5\' 8"  (1.727 m)   Wt 86.2 kg   LMP 03/06/2021 (Exact Date)   SpO2 100%   BMI 28.89 kg/m  Gen:   Awake, no distress   Resp:  Normal effort  MSK:   Moves extremities without difficulty  Other:    Medical Decision Making  Medically screening exam initiated at 6:20 PM.  Appropriate orders placed.  Cyerra Almee Pelphrey was informed that the remainder of the evaluation will be completed by another provider, this initial triage assessment does not replace that evaluation, and the importance of remaining in the ED until their evaluation is complete.  No additional orders   Willaim Rayas, NP 03/12/21 1821    Nance Pear, MD 03/12/21 1904

## 2021-03-12 NOTE — ED Triage Notes (Signed)
Pt reports was coming out of a business and the door shut and cut the back of her left foot. Bleeding controlled at this time.

## 2021-03-12 NOTE — Discharge Instructions (Signed)
Do not get the sutured area wet for 24 hours. After 24 hours, shower/bathe as usual and pat the area dry. °Change the bandage 2 times per day and apply antibiotic ointment. °Leave open to air when at no risk of getting the area dirty, but cover at night before bed. °See your PCP or go to Urgent Care in 10 days for suture removal or sooner for signs or concern of infection. ° °

## 2021-03-18 ENCOUNTER — Ambulatory Visit: Payer: BC Managed Care – PPO

## 2021-03-22 NOTE — ED Provider Notes (Signed)
[facility] Emergency Department Provider Note  ____________________________________________  Time seen: Approximately 4:17 PM  I have reviewed the triage vital signs and the nursing notes.   HISTORY  Chief Complaint Laceration   HPI Alexandra Le is a 50 y.o. female presents to the emergency department for treatment and evaluation of laceration to the back of her left heel.  She was coming out of the laundromat and the door closed and caught her foot.  Bleeding is well controlled at this time.  Past Medical History:  Diagnosis Date   Anemia    history of anemai due to AUB   Anxiety    Chest pain of unknown etiology 2015   was seen in [place], [place], [place]- not dx'd and no follow up   Depression    Hypertension    PONV (postoperative nausea and vomiting)     Patient Active Problem List   Diagnosis Date Noted   Elevated low density lipoprotein (LDL) cholesterol level 03/07/2021   Obesity 03/06/2021   Elevated white blood cell count 02/12/2021   Vitamin D deficiency 02/12/2021   Chronic kidney disease, stage 3a ([name]) 02/12/2021   Elevated LFTs 07/29/2019   Polyp of sigmoid colon    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   Generalized anxiety disorder 02/11/2015   Hypertension 02/11/2015   Depression 02/11/2015   Insomnia 02/11/2015   Lumbago 02/11/2015    Past Surgical History:  Procedure Laterality Date   BIOPSY N/A 07/21/2016   Procedure: BIOPSY;  Surgeon: Lucilla Lame, MD;  Location: [facility];  Service: Endoscopy;  Laterality: N/A;  sigmoid polyps x2   CERVICAL CONIZATION W/BX N/A 04/30/2015   Procedure: CONIZATION CERVIX WITH BIOPSY;  Surgeon: Christophe Louis, MD;  Location: [facility];  Service: Gynecology;  Laterality: N/A;   COLONOSCOPY WITH PROPOFOL N/A 07/21/2016   Procedure: COLONOSCOPY WITH PROPOFOL;  Surgeon: Lucilla Lame, MD;  Location: [facility];  Service: Endoscopy;  Laterality: N/A;   pyloric  stenosis     TUBAL LIGATION      Prior to Admission medications   Medication Sig Start Date End Date Taking? Authorizing Provider  benazepril (LOTENSIN) 40 MG tablet Take 1 tablet (40 mg total) by mouth daily. 06/24/20   Cannady, Henrine Screws T, NP  hydrochlorothiazide (HYDRODIURIL) 25 MG tablet Take 1 tablet (25 mg total) by mouth daily. 06/24/20   Cannady, Henrine Screws T, NP  traMADol (ULTRAM) 50 MG tablet Take 1 tablet (50 mg total) by mouth every 6 (six) hours as needed. 09/17/19   Cannady, Henrine Screws T, NP  traZODone (DESYREL) 50 MG tablet Take 0.5-1 tablets (25-50 mg total) by mouth at bedtime as needed for sleep. 06/09/20   Cannady, Henrine Screws T, NP  venlafaxine XR (EFFEXOR-XR) 75 MG 24 hr capsule TAKE 1 CAPSULE (75 MG TOTAL) BY MOUTH DAILY WITH BREAKFAST. 06/24/20   Venita Lick, NP    Allergies Patient has no known allergies.  Family History  Adopted: Yes  Problem Relation Age of Onset   Breast cancer Neg Hx     Social History Social History   Tobacco Use   Smoking status: Never   Smokeless tobacco: Never  Vaping Use   Vaping Use: Never used  Substance Use Topics   Alcohol use: Yes    Alcohol/week: 1.0 standard drink    Types: 1 Glasses of wine per week    Comment: occasional   Drug use: No    Review of Systems  Constitutional: Negative for fever. Respiratory:  Negative for cough or shortness of breath.  Musculoskeletal: Negative for myalgias Skin: Positive for laceration Neurological: Negative for numbness or paresthesias. ____________________________________________   PHYSICAL EXAM:  VITAL SIGNS: ED Triage Vitals  Enc Vitals Group     BP 03/12/21 1818 (!) 164/83     Pulse Rate 03/12/21 1818 75     Resp 03/12/21 1818 20     Temp 03/12/21 1818 98.5 F (36.9 C)     Temp Source 03/12/21 1818 Oral     SpO2 03/12/21 1818 100 %     Weight 03/12/21 1801 190 lb (86.2 kg)     Height 03/12/21 1801 5\' 8"  (1.727 m)     Head Circumference --      Peak Flow --      Pain Score  03/12/21 1801 0     Pain Loc --      Pain Edu? --      Excl. in [place]? --      Constitutional: Overall well appearing. Eyes: Conjunctivae are clear without discharge or drainage. Nose: No rhinorrhea noted. Mouth/Throat: Airway is patent.  Neck: No stridor. Unrestricted range of motion observed. Cardiovascular: Capillary refill is <3 seconds.  Respiratory: Respirations are even and unlabored.. Musculoskeletal: Unrestricted range of motion observed. Neurologic: Awake, alert, and oriented x 4.  Skin: 4 cm laceration overlying the posterior heel  ____________________________________________   LABS (all labs ordered are listed, but only abnormal results are displayed)  Labs Reviewed - No data to display ____________________________________________  EKG  Not indicated. ____________________________________________  RADIOLOGY  Not indicated ____________________________________________   PROCEDURES  .Marland KitchenLaceration Repair  Date/Time: 03/22/2021 4:19 PM Performed by: Victorino Dike, FNP Authorized by: Victorino Dike, FNP   Consent:    Consent obtained:  Verbal   Consent given by:  Patient   Risks discussed:  Infection and pain Anesthesia:    Anesthesia method:  Local infiltration   Local anesthetic:  Lidocaine 1% w/o epi Laceration details:    Location:  Foot   Foot location:  L achilles   Length (cm):  4 Pre-procedure details:    Preparation:  Patient was prepped and draped in usual sterile fashion Treatment:    Area cleansed with:  Povidone-iodine   Amount of cleaning:  Standard   Irrigation solution:  Sterile saline   Irrigation method:  Syringe   Debridement:  None Skin repair:    Repair method:  Sutures   Suture size:  4-0   Suture material:  Nylon   Suture technique:  Simple interrupted   Number of sutures:  6 Approximation:    Approximation:  Close Repair type:    Repair type:  Simple Post-procedure details:    Dressing:  Sterile dressing    Procedure completion:  Tolerated ____________________________________________   INITIAL IMPRESSION / ASSESSMENT AND PLAN / ED COURSE  Alexandra Le is a 50 y.o. female presenting to the emergency department for treatment and evaluation after sustaining a laceration to the posterior heel overlying the Achilles.  She has no tendon disruption.  She has full range of motion of the foot.  Wound repaired as described above.  Sutures are to be removed in about 10 days either primary care or Urgent care.  She was encouraged to return to the emergency department for symptoms of change or worsen or for concern of infection if she is unable to be seen in primary care office.  Medications  lidocaine (PF) (XYLOCAINE) 1 % injection 5 mL (5 mLs Intradermal Given  03/12/21 2151)  bacitracin ointment 1 application (1 application Topical Given 03/12/21 2151)     Pertinent labs & imaging results that were available during my care of the patient were reviewed by me and considered in my medical decision making (see chart for details).  ____________________________________________   FINAL CLINICAL IMPRESSION(S) / ED DIAGNOSES  Final diagnoses:  Laceration of right heel, initial encounter    ED Discharge Orders     None        Note:  This document was prepared using Dragon voice recognition software and may include unintentional dictation errors.   Victorino Dike, FNP 03/22/21 1622    Delman Kitten, MD 03/24/21 [phone]

## 2021-03-28 ENCOUNTER — Encounter: Payer: Self-pay | Admitting: Nurse Practitioner

## 2021-03-30 ENCOUNTER — Other Ambulatory Visit: Payer: Self-pay

## 2021-03-30 ENCOUNTER — Ambulatory Visit (INDEPENDENT_AMBULATORY_CARE_PROVIDER_SITE_OTHER): Payer: BC Managed Care – PPO | Admitting: Nurse Practitioner

## 2021-03-30 ENCOUNTER — Encounter: Payer: Self-pay | Admitting: Nurse Practitioner

## 2021-03-30 DIAGNOSIS — L03116 Cellulitis of left lower limb: Secondary | ICD-10-CM

## 2021-03-30 DIAGNOSIS — L0390 Cellulitis, unspecified: Secondary | ICD-10-CM | POA: Insufficient documentation

## 2021-03-30 MED ORDER — CEPHALEXIN 500 MG PO CAPS: 500.0000 mg | ORAL_CAPSULE | Freq: Three times a day (TID) | ORAL | 0 refills | Status: DC

## 2021-03-30 NOTE — Assessment & Plan Note (Signed)
To left heel.  Applied steri strips across wound and covered with non adherent dressing and paper tape.  Will treat at this time with Keflex 500 MG TID x 7 days.  Educated patient on infection and monitoring.  Return in one week.  Wound culture sent.

## 2021-03-30 NOTE — Patient Instructions (Signed)
Wound Care, Adult ?Taking care of your wound properly can help to prevent pain, infection, and scarring. It can also help your wound heal more quickly. Follow instructions from your health care provider about how to care for your wound. ?Supplies needed: ?Soap and water. ?Wound cleanser, saline, or germ-free (sterile) water. ?Gauze. ?If needed, a clean bandage (dressing) or other type of wound dressing material to cover or place in the wound. Follow your health care provider's instructions about what dressing supplies to use. ?Cream or topical ointment to apply to the wound, if told by your health care provider. ?How to care for your wound ?Cleaning the wound ?Ask your health care provider how to clean the wound. This may include: ?Using mild soap and water, a wound cleanser, saline, or sterile water. ?Using a clean gauze to pat the wound dry after cleaning it. Do not rub or scrub the wound. ?Dressing care ?Wash your hands with soap and water for at least 20 seconds before and after you change the dressing. If soap and water are not available, use hand sanitizer. ?Change your dressing as told by your health care provider. This may include: ?Cleaning or rinsing out (irrigating) the wound. ?Application of cream or topical ointment, if told by your health care provider. ?Placing a dressing over the wound or in the wound (packing). ?Covering the wound with an outer dressing. ?Leave stitches (sutures), staples, skin glue, or adhesive strips in place. These skin closures may need to stay in place for 2 weeks or longer. If adhesive strip edges start to loosen and curl up, you may trim the loose edges. Do not remove adhesive strips completely unless your health care provider tells you to do that. ?Ask your health care provider when you can leave the wound uncovered. ?Checking for infection ?Check your wound area every day for signs of infection. Check for: ?More redness, swelling, or pain. ?Fluid or blood. ?Warmth. ?Pus or  a bad smell. ? ?Follow these instructions at home ?Medicines ?If you were prescribed an antibiotic medicine, cream, or ointment, take or apply it as told by your health care provider. Do not stop using the antibiotic even if your condition improves. ?If you were prescribed pain medicine, take it 30 minutes before you do any wound care or as told by your health care provider. ?Take over-the-counter and prescription medicines only as told by your health care provider. ?Eating and drinking ?Eat a diet that includes protein, vitamin A, vitamin C, and other nutrient-rich foods to help the wound heal. ?Foods rich in protein include meat, fish, eggs, dairy, beans, and nuts. ?Foods rich in vitamin A include carrots and dark green, leafy vegetables. ?Foods rich in vitamin C include citrus fruits, tomatoes, broccoli, and peppers. ?Drink enough fluid to keep your urine pale yellow. ?General instructions ?Do not take baths, swim, or use a hot tub until your health care provider approves. Ask your health care provider if you may take showers. You may only be allowed to take sponge baths. ?Do not scratch or pick at the wound. Keep it covered as told by your health care provider. ?Return to your normal activities as told by your health care provider. Ask your health care provider what activities are safe for you. ?Protect your wound from the sun when you are outside for the first 6 months, or for as long as told by your health care provider. Cover up the scar area or apply sunscreen that has an SPF of at least 30. ?Do not   use any products that contain nicotine or tobacco. These products include cigarettes, chewing tobacco, and vaping devices, such as e-cigarettes. If you need help quitting, ask your health care provider. ?Keep all follow-up visits. This is important. ?Contact a health care provider if: ?You received a tetanus shot and you have swelling, severe pain, redness, or bleeding at the injection site. ?Your pain is not  controlled with medicine. ?You have any of these signs of infection: ?More redness, swelling, or pain around the wound. ?Fluid or blood coming from the wound. ?Warmth coming from the wound. ?A fever or chills. ?You are nauseous or you vomit. ?You are dizzy. ?You have a new rash or hardness around the wound. ?Get help right away if: ?You have a red streak of skin near the area around your wound. ?Pus or a bad smell coming from the wound. ?Your wound has been closed with staples, sutures, skin glue, or adhesive strips and it begins to open up and separate. ?Your wound is bleeding, and the bleeding does not stop with gentle pressure. ?These symptoms may represent a serious problem that is an emergency. Do not wait to see if the symptoms will go away. Get medical help right away. Call your local emergency services (911 in the [place] Do not drive yourself to the hospital. ?Summary ?Always wash your hands with soap and water for at least 20 seconds before and after changing your dressing. ?Change your dressing as told by your health care provider. ?To help with healing, eat foods that are rich in protein, vitamin A, vitamin C, and other nutrients. ?Check your wound every day for signs of infection. Contact your health care provider if you think that your wound is infected. ?This information is not intended to replace advice given to you by your health care provider. Make sure you discuss any questions you have with your health care provider. ?Document Revised: 08/31/2020 Document Reviewed: 08/31/2020 ?Elsevier Patient Education ? 2022 [employer] ? ?

## 2021-03-30 NOTE — Progress Notes (Signed)
BP 117/78   Pulse 83   Temp 98.7 F (37.1 C) (Oral)   Wt 201 lb (91.2 kg)   LMP 03/06/2021 (Exact Date)   SpO2 97%   BMI 30.56 kg/m    Subjective:    Patient ID: Alexandra Le, female    DOB: Oct 16, 1970, 50 y.o.   MRN: [id number]  HPI: Alexandra Le is a 50 y.o. female  Chief Complaint  Patient presents with   Wound Check    Patient states she thinks it may be infected. Patient states she had the stitches placed in 03/11/21. Patient denies having any drainage. Patient has noticed redness and soreness in the area.    ER FOLLOW UP Seen in ER on 03/12/21 for laceration of the left heel, this too place after foot was caught on door.  Sutures were placed in ER x 6 simple interrupted.  These are to be removed in 10 days -- husband took them out 3-4 days ago at home.  Sore an tender at this time with swelling per patient report.  No drainage.   Time since discharge: 18 days Hospital/facility: [facility] Diagnosis: laceration left heel Procedures/tests: suturing Consultants: none New medications: none Discharge instructions:  follow-up with PCP in 10 days Status: stable   Relevant past medical, surgical, family and social history reviewed and updated as indicated. Interim medical history since our last visit reviewed. Allergies and medications reviewed and updated.  Review of Systems  Constitutional:  Negative for activity change, appetite change, diaphoresis, fatigue and fever.  Respiratory:  Negative for cough, chest tightness and shortness of breath.   Cardiovascular:  Negative for chest pain, palpitations and leg swelling.  Gastrointestinal: Negative.   Skin:  Positive for wound.  Psychiatric/Behavioral: Negative.     Per HPI unless specifically indicated above     Objective:    BP 117/78   Pulse 83   Temp 98.7 F (37.1 C) (Oral)   Wt 201 lb (91.2 kg)   LMP 03/06/2021 (Exact Date)   SpO2 97%   BMI 30.56 kg/m   Wt Readings from Last 3 Encounters:   03/30/21 201 lb (91.2 kg)  03/12/21 190 lb (86.2 kg)  03/07/21 198 lb 9.6 oz (90.1 kg)    Physical Exam Vitals and nursing note reviewed.  Constitutional:      Appearance: She is well-developed.  HENT:     Head: Normocephalic.  Eyes:     General:        Right eye: No discharge.        Left eye: No discharge.     Conjunctiva/sclera: Conjunctivae normal.     Pupils: Pupils are equal, round, and reactive to light.  Neck:     Thyroid: No thyromegaly.     Vascular: No carotid bruit or JVD.  Cardiovascular:     Rate and Rhythm: Normal rate and regular rhythm.     Heart sounds: Normal heart sounds. No murmur heard.   No gallop.  Pulmonary:     Effort: Pulmonary effort is normal.     Breath sounds: Normal breath sounds.  Abdominal:     General: Bowel sounds are normal. There is no distension.     Palpations: Abdomen is soft.     Tenderness: There is no abdominal tenderness.  Musculoskeletal:     Cervical back: Normal range of motion and neck supple.     Right lower leg: No edema.     Left lower leg: No edema.  Lymphadenopathy:  Cervical: No cervical adenopathy.  Skin:    General: Skin is warm and dry.       Neurological:     Mental Status: She is alert and oriented to person, place, and time.  Psychiatric:        Attention and Perception: Attention normal.        Mood and Affect: Mood normal.        Speech: Speech normal.        Behavior: Behavior normal.        Thought Content: Thought content normal.    Results for orders placed or performed in visit on 03/07/21  CBC with Differential/Platelet  Result Value Ref Range   WBC 6.5 3.4 - 10.8 x10E3/uL   RBC 4.25 3.77 - 5.28 x10E6/uL   Hemoglobin 12.7 11.1 - 15.9 g/dL   Hematocrit 39.5 34.0 - 46.6 %   MCV 93 79 - 97 fL   MCH 29.9 26.6 - 33.0 pg   MCHC 32.2 31.5 - 35.7 g/dL   RDW 13.5 11.7 - 15.4 %   Platelets 352 150 - 450 x10E3/uL   Neutrophils 56 Not Estab. %   Lymphs 33 Not Estab. %   Monocytes 6 Not Estab.  %   Eos 4 Not Estab. %   Basos 1 Not Estab. %   Neutrophils Absolute 3.6 1.4 - 7.0 x10E3/uL   Lymphocytes Absolute 2.2 0.7 - 3.1 x10E3/uL   Monocytes Absolute 0.4 0.1 - 0.9 x10E3/uL   EOS (ABSOLUTE) 0.3 0.0 - 0.4 x10E3/uL   Basophils Absolute 0.1 0.0 - 0.2 x10E3/uL   Immature Granulocytes 0 Not Estab. %   Immature Grans (Abs) 0.0 0.0 - 0.1 x10E3/uL  Comprehensive metabolic panel  Result Value Ref Range   Glucose 92 70 - 99 mg/dL   BUN 9 6 - 24 mg/dL   Creatinine, Ser 0.85 0.57 - 1.00 mg/dL   eGFR 83 >59 mL/min/1.73   BUN/Creatinine Ratio 11 9 - 23   Sodium 141 134 - 144 mmol/L   Potassium 4.2 3.5 - 5.2 mmol/L   Chloride 102 96 - 106 mmol/L   CO2 25 20 - 29 mmol/L   Calcium 9.0 8.7 - 10.2 mg/dL   Total Protein 7.3 6.0 - 8.5 g/dL   Albumin 4.5 3.8 - 4.9 g/dL   Globulin, Total 2.8 1.5 - 4.5 g/dL   Albumin/Globulin Ratio 1.6 1.2 - 2.2   Bilirubin Total 0.4 0.0 - 1.2 mg/dL   Alkaline Phosphatase 129 (H) 44 - 121 IU/L   AST 35 0 - 40 IU/L   ALT 48 (H) 0 - 32 IU/L  Hepatitis C antibody  Result Value Ref Range   Hep C Virus Ab <0.1 0.0 - 0.9 s/co ratio  VITAMIN D 25 Hydroxy (Vit-D Deficiency, Fractures)  Result Value Ref Range   Vit D, 25-Hydroxy 31.1 30.0 - 100.0 ng/mL  Microalbumin, Urine Waived  Result Value Ref Range   Microalb, Ur Waived 10 0 - 19 mg/L   Creatinine, Urine Waived 50 10 - 300 mg/dL   Microalb/Creat Ratio 30-300 (H) <30 mg/g  Gamma GT  Result Value Ref Range   GGT 42 0 - 60 IU/L  Lipid Panel w/o Chol/HDL Ratio  Result Value Ref Range   Cholesterol, Total 235 (H) 100 - 199 mg/dL   Triglycerides 310 (H) 0 - 149 mg/dL   HDL 56 >39 mg/dL   VLDL Cholesterol Cal 54 (H) 5 - 40 mg/dL   LDL Chol Calc (NIH) 125 (H) 0 -  99 mg/dL      Assessment & Plan:   Problem List Items Addressed This Visit       Other   Cellulitis    To left heel.  Applied steri strips across wound and covered with non adherent dressing and paper tape.  Will treat at this time with  Keflex 500 MG TID x 7 days.  Educated patient on infection and monitoring.  Return in one week.  Wound culture sent.      Relevant Orders   Wound culture     Follow up plan: Return in about 1 week (around 04/06/2021) for Wound check.

## 2021-04-06 ENCOUNTER — Other Ambulatory Visit: Payer: Self-pay

## 2021-04-06 ENCOUNTER — Ambulatory Visit (INDEPENDENT_AMBULATORY_CARE_PROVIDER_SITE_OTHER): Payer: BC Managed Care – PPO | Admitting: Nurse Practitioner

## 2021-04-06 ENCOUNTER — Encounter: Payer: Self-pay | Admitting: Nurse Practitioner

## 2021-04-06 DIAGNOSIS — L03116 Cellulitis of left lower limb: Secondary | ICD-10-CM

## 2021-04-06 MED ORDER — CEPHALEXIN 500 MG PO CAPS: 500.0000 mg | ORAL_CAPSULE | Freq: Three times a day (TID) | ORAL | 0 refills | Status: AC

## 2021-04-06 NOTE — Assessment & Plan Note (Signed)
To left heel, improving on exam today but not 100%.  Awaiting culture still.  Applied steri strips across wound and covered with non adherent dressing and wrap.  Will extend Keflex 500 MG TID x 7 days.  Educated patient on infection and monitoring.  Return in one week.

## 2021-04-06 NOTE — Patient Instructions (Signed)
Wound Care, Adult ?Taking care of your wound properly can help to prevent pain, infection, and scarring. It can also help your wound heal more quickly. Follow instructions from your health care provider about how to care for your wound. ?Supplies needed: ?Soap and water. ?Wound cleanser, saline, or germ-free (sterile) water. ?Gauze. ?If needed, a clean bandage (dressing) or other type of wound dressing material to cover or place in the wound. Follow your health care provider's instructions about what dressing supplies to use. ?Cream or topical ointment to apply to the wound, if told by your health care provider. ?How to care for your wound ?Cleaning the wound ?Ask your health care provider how to clean the wound. This may include: ?Using mild soap and water, a wound cleanser, saline, or sterile water. ?Using a clean gauze to pat the wound dry after cleaning it. Do not rub or scrub the wound. ?Dressing care ?Wash your hands with soap and water for at least 20 seconds before and after you change the dressing. If soap and water are not available, use hand sanitizer. ?Change your dressing as told by your health care provider. This may include: ?Cleaning or rinsing out (irrigating) the wound. ?Application of cream or topical ointment, if told by your health care provider. ?Placing a dressing over the wound or in the wound (packing). ?Covering the wound with an outer dressing. ?Leave stitches (sutures), staples, skin glue, or adhesive strips in place. These skin closures may need to stay in place for 2 weeks or longer. If adhesive strip edges start to loosen and curl up, you may trim the loose edges. Do not remove adhesive strips completely unless your health care provider tells you to do that. ?Ask your health care provider when you can leave the wound uncovered. ?Checking for infection ?Check your wound area every day for signs of infection. Check for: ?More redness, swelling, or pain. ?Fluid or blood. ?Warmth. ?Pus or  a bad smell. ? ?Follow these instructions at home ?Medicines ?If you were prescribed an antibiotic medicine, cream, or ointment, take or apply it as told by your health care provider. Do not stop using the antibiotic even if your condition improves. ?If you were prescribed pain medicine, take it 30 minutes before you do any wound care or as told by your health care provider. ?Take over-the-counter and prescription medicines only as told by your health care provider. ?Eating and drinking ?Eat a diet that includes protein, vitamin A, vitamin C, and other nutrient-rich foods to help the wound heal. ?Foods rich in protein include meat, fish, eggs, dairy, beans, and nuts. ?Foods rich in vitamin A include carrots and dark green, leafy vegetables. ?Foods rich in vitamin C include citrus fruits, tomatoes, broccoli, and peppers. ?Drink enough fluid to keep your urine pale yellow. ?General instructions ?Do not take baths, swim, or use a hot tub until your health care provider approves. Ask your health care provider if you may take showers. You may only be allowed to take sponge baths. ?Do not scratch or pick at the wound. Keep it covered as told by your health care provider. ?Return to your normal activities as told by your health care provider. Ask your health care provider what activities are safe for you. ?Protect your wound from the sun when you are outside for the first 6 months, or for as long as told by your health care provider. Cover up the scar area or apply sunscreen that has an SPF of at least 30. ?Do not   use any products that contain nicotine or tobacco. These products include cigarettes, chewing tobacco, and vaping devices, such as e-cigarettes. If you need help quitting, ask your health care provider. ?Keep all follow-up visits. This is important. ?Contact a health care provider if: ?You received a tetanus shot and you have swelling, severe pain, redness, or bleeding at the injection site. ?Your pain is not  controlled with medicine. ?You have any of these signs of infection: ?More redness, swelling, or pain around the wound. ?Fluid or blood coming from the wound. ?Warmth coming from the wound. ?A fever or chills. ?You are nauseous or you vomit. ?You are dizzy. ?You have a new rash or hardness around the wound. ?Get help right away if: ?You have a red streak of skin near the area around your wound. ?Pus or a bad smell coming from the wound. ?Your wound has been closed with staples, sutures, skin glue, or adhesive strips and it begins to open up and separate. ?Your wound is bleeding, and the bleeding does not stop with gentle pressure. ?These symptoms may represent a serious problem that is an emergency. Do not wait to see if the symptoms will go away. Get medical help right away. Call your local emergency services (911 in the [place] Do not drive yourself to the hospital. ?Summary ?Always wash your hands with soap and water for at least 20 seconds before and after changing your dressing. ?Change your dressing as told by your health care provider. ?To help with healing, eat foods that are rich in protein, vitamin A, vitamin C, and other nutrients. ?Check your wound every day for signs of infection. Contact your health care provider if you think that your wound is infected. ?This information is not intended to replace advice given to you by your health care provider. Make sure you discuss any questions you have with your health care provider. ?Document Revised: 08/31/2020 Document Reviewed: 08/31/2020 ?Elsevier Patient Education ? 2022 [employer] ? ?

## 2021-04-06 NOTE — Progress Notes (Signed)
BP 114/75   Pulse 72   Temp 98.2 F (36.8 C) (Oral)   Wt 201 lb (91.2 kg)   SpO2 97%   BMI 30.56 kg/m    Subjective:    Patient ID: Alexandra Le, female    DOB: 20-Dec-1970, 50 y.o.   MRN: [id number]  HPI: Alexandra Le is a 50 y.o. female  Chief Complaint  Patient presents with   Laceration    Follow up for laceration on heel. Patient has no concerns for this visit at the moment   SKIN INFECTION Follow-up from visit on 03/30/21 when was treated with Keflex for cellulitis at wound site + culture was sent -- this is still pending final result.  Seen in ER on 03/12/21 for laceration of the left heel, this took place after foot was caught on door.  Sutures were placed in ER x 6 simple interrupted.  Her husband took the sutures out at home and she then noticed wound was mildly open.   Duration: weeks Location: left heel History of trauma in area: yes Pain: yes Quality:  mild Redness: no Swelling: yes Oozing: no Pus: no Fevers: no Nausea/vomiting: no Status: stable Treatments attempted:antibiotics  Tetanus: UTD   Relevant past medical, surgical, family and social history reviewed and updated as indicated. Interim medical history since our last visit reviewed. Allergies and medications reviewed and updated.  Review of Systems  Constitutional:  Negative for activity change, appetite change, diaphoresis, fatigue and fever.  Respiratory:  Negative for cough, chest tightness and shortness of breath.   Cardiovascular:  Negative for chest pain, palpitations and leg swelling.  Gastrointestinal: Negative.   Skin:  Positive for wound.  Psychiatric/Behavioral: Negative.     Per HPI unless specifically indicated above     Objective:    BP 114/75   Pulse 72   Temp 98.2 F (36.8 C) (Oral)   Wt 201 lb (91.2 kg)   SpO2 97%   BMI 30.56 kg/m   Wt Readings from Last 3 Encounters:  04/06/21 201 lb (91.2 kg)  03/30/21 201 lb (91.2 kg)  03/12/21 190 lb (86.2 kg)     Physical Exam Vitals and nursing note reviewed.  Constitutional:      Appearance: She is well-developed.  HENT:     Head: Normocephalic.  Eyes:     General:        Right eye: No discharge.        Left eye: No discharge.     Conjunctiva/sclera: Conjunctivae normal.     Pupils: Pupils are equal, round, and reactive to light.  Neck:     Thyroid: No thyromegaly.     Vascular: No carotid bruit or JVD.  Cardiovascular:     Rate and Rhythm: Normal rate and regular rhythm.     Heart sounds: Normal heart sounds. No murmur heard.   No gallop.  Pulmonary:     Effort: Pulmonary effort is normal.     Breath sounds: Normal breath sounds.  Abdominal:     General: Bowel sounds are normal. There is no distension.     Palpations: Abdomen is soft.     Tenderness: There is no abdominal tenderness.  Musculoskeletal:     Cervical back: Normal range of motion and neck supple.     Right lower leg: No edema.     Left lower leg: No edema.  Lymphadenopathy:     Cervical: No cervical adenopathy.  Skin:    General: Skin is warm and  dry.          Comments: Wound to lateral outer edge with approximation present -- medial edge remains slightly open.  Benzoin and steri strips applied to area, then wrapped.  Neurological:     Mental Status: She is alert and oriented to person, place, and time.  Psychiatric:        Attention and Perception: Attention normal.        Mood and Affect: Mood normal.        Speech: Speech normal.        Behavior: Behavior normal.        Thought Content: Thought content normal.    Results for orders placed or performed in visit on 03/30/21  Wound culture   Specimen: Wound   WO  Result Value Ref Range   Gram Stain Result Final report    Organism ID, Bacteria Comment    Organism ID, Bacteria No organisms seen    Aerobic Bacterial Culture Preliminary report (A)    Organism ID, Bacteria Gram negative rods (A)    Organism ID, Bacteria Routine flora       Assessment  & Plan:   Problem List Items Addressed This Visit       Other   Cellulitis    To left heel, improving on exam today but not 100%.  Awaiting culture still.  Applied steri strips across wound and covered with non adherent dressing and wrap.  Will extend Keflex 500 MG TID x 7 days.  Educated patient on infection and monitoring.  Return in one week.          Follow up plan: Return in about 1 week (around 04/13/2021) for Wound check.

## 2021-04-07 LAB — WOUND CULTURE: Organism ID, Bacteria: NONE SEEN

## 2021-04-07 NOTE — Progress Notes (Signed)
Contacted via MyChart   Overall culture showed no significant growth Lindey, I would continue antibiotic at this time though and complete to ensure proper healing.

## 2021-04-15 ENCOUNTER — Ambulatory Visit: Payer: BC Managed Care – PPO | Admitting: Nurse Practitioner

## 2021-04-20 ENCOUNTER — Encounter: Payer: Self-pay | Admitting: Nurse Practitioner

## 2021-05-18 ENCOUNTER — Other Ambulatory Visit: Payer: Self-pay

## 2021-05-18 ENCOUNTER — Ambulatory Visit
Admission: RE | Admit: 2021-05-18 | Discharge: 2021-05-18 | Disposition: A | Discharge status: Home / Self Care | Payer: BC Managed Care – PPO | Source: Ambulatory Visit | Attending: Nurse Practitioner | Admitting: Nurse Practitioner

## 2021-05-18 DIAGNOSIS — R7989 Other specified abnormal findings of blood chemistry: Secondary | ICD-10-CM | POA: Insufficient documentation

## 2021-05-18 NOTE — Progress Notes (Signed)
Contacted via MyChart   Good evening Derin, your ultrasound has returned -- overall no gallstones noted.  There is increased thickness of the liver -- we see this often with fatty liver disease, a very common finding.  I recommend you focus highly on diet changes and regular activity to help reduce weight, which in turn can help liver health.  Also avoid Tylenol when possible and minimize alcohol use -- as these can affect liver.  We will continue to monitor liver function on labs closely, recent labs looked a bit better.  Any questions? Keep being amazing!!  Thank you for allowing me to participate in your care.  I appreciate you. Kindest regards, Oley Lahaie

## 2021-05-27 ENCOUNTER — Encounter: Payer: Self-pay | Admitting: Nurse Practitioner

## 2021-06-29 ENCOUNTER — Other Ambulatory Visit: Payer: Self-pay | Admitting: Nurse Practitioner

## 2021-06-29 NOTE — Telephone Encounter (Signed)
Patient last seen 03/07/21

## 2021-06-29 NOTE — Telephone Encounter (Signed)
Requested medication (s) are due for refill today - expired Rx  Requested medication (s) are on the active medication list -yes  Future visit scheduled -no  Last refill: 06/24/20 #90 4RF  Notes to clinic: Request RF: fails lab protocol- provider is aware of lab result, expired Rx  Requested Prescriptions  Pending Prescriptions Disp Refills   venlafaxine XR (EFFEXOR-XR) 75 MG 24 hr capsule [Pharmacy Med Name: VENLAFAXINE HCL ER 75 MG CAP] 30 capsule 14    Sig: TAKE 1 CAPSULE BY MOUTH DAILY WITH BREAKFAST.     Psychiatry: Antidepressants - SNRI - desvenlafaxine & venlafaxine Failed - 06/29/2021  1:47 AM      Failed - Lipid Panel in normal range within the last 12 months    Cholesterol, Total  Date Value Ref Range Status  03/07/2021 235 (H) 100 - 199 mg/dL Final   LDL Chol Calc (NIH)  Date Value Ref Range Status  03/07/2021 125 (H) 0 - 99 mg/dL Final   HDL  Date Value Ref Range Status  03/07/2021 56 >39 mg/dL Final   Triglycerides  Date Value Ref Range Status  03/07/2021 310 (H) 0 - 149 mg/dL Final         Passed - Cr in normal range and within 360 days    Creatinine, Ser  Date Value Ref Range Status  03/07/2021 0.85 0.57 - 1.00 mg/dL Final          Passed - Completed PHQ-2 or PHQ-9 in the last 360 days      Passed - Last BP in normal range    BP Readings from Last 1 Encounters:  04/06/21 114/75          Passed - Valid encounter within last 6 months    Recent Outpatient Visits           2 months ago Cellulitis of left lower extremity   [place], Jolene T, NP   3 months ago Cellulitis of left lower extremity   [place], Jolene T, NP   3 months ago Severe episode of recurrent major depressive disorder, without psychotic features ([place])   [place], Barbaraann Faster, NP   1 year ago Encounter for annual physical exam   [place], Jolene T, NP   1 year ago Severe episode of  recurrent major depressive disorder, without psychotic features ([place])   [place], Jolene T, NP                 Requested Prescriptions  Pending Prescriptions Disp Refills   venlafaxine XR (EFFEXOR-XR) 75 MG 24 hr capsule [Pharmacy Med Name: VENLAFAXINE HCL ER 75 MG CAP] 30 capsule 14    Sig: TAKE 1 CAPSULE BY MOUTH DAILY WITH BREAKFAST.     Psychiatry: Antidepressants - SNRI - desvenlafaxine & venlafaxine Failed - 06/29/2021  1:47 AM      Failed - Lipid Panel in normal range within the last 12 months    Cholesterol, Total  Date Value Ref Range Status  03/07/2021 235 (H) 100 - 199 mg/dL Final   LDL Chol Calc (NIH)  Date Value Ref Range Status  03/07/2021 125 (H) 0 - 99 mg/dL Final   HDL  Date Value Ref Range Status  03/07/2021 56 >39 mg/dL Final   Triglycerides  Date Value Ref Range Status  03/07/2021 310 (H) 0 - 149 mg/dL Final         Passed - Cr in normal  range and within 360 days    Creatinine, Ser  Date Value Ref Range Status  03/07/2021 0.85 0.57 - 1.00 mg/dL Final          Passed - Completed PHQ-2 or PHQ-9 in the last 360 days      Passed - Last BP in normal range    BP Readings from Last 1 Encounters:  04/06/21 114/75          Passed - Valid encounter within last 6 months    Recent Outpatient Visits           2 months ago Cellulitis of left lower extremity   [place], Jolene T, NP   3 months ago Cellulitis of left lower extremity   [place], Jolene T, NP   3 months ago Severe episode of recurrent major depressive disorder, without psychotic features ([place])   [place], Barbaraann Faster, NP   1 year ago Encounter for annual physical exam   [place], Jolene T, NP   1 year ago Severe episode of recurrent major depressive disorder, without psychotic features ([place])   [place], Barbaraann Faster, NP

## 2021-07-22 ENCOUNTER — Other Ambulatory Visit: Payer: Self-pay | Admitting: Nurse Practitioner

## 2021-07-22 NOTE — Telephone Encounter (Signed)
Left message asking pt to call back to schedule an appt/ Pt due for fu 09/04/2021 ?

## 2021-07-22 NOTE — Telephone Encounter (Signed)
Requested medication (s) are due for refill today: No ? ?Requested medication (s) are on the active medication list: Yes ? ?Last refill:  06/29/21 ? ?Future visit scheduled: No ? ?Notes to clinic:  Pharmacy requesting 90 supply. ? ? ? ?Requested Prescriptions  ?Pending Prescriptions Disp Refills  ? venlafaxine XR (EFFEXOR-XR) 75 MG 24 hr capsule [Pharmacy Med Name: VENLAFAXINE HCL ER 75 MG CAP] 90 capsule 5  ?  Sig: TAKE 1 CAPSULE BY MOUTH DAILY WITH BREAKFAST.  ?  ? Psychiatry: Antidepressants - SNRI - desvenlafaxine & venlafaxine Failed - 07/22/2021  8:42 AM  ?  ?  Failed - Lipid Panel in normal range within the last 12 months  ?  Cholesterol, Total  ?Date Value Ref Range Status  ?03/07/2021 235 (H) 100 - 199 mg/dL Final  ? ?LDL Chol Calc (NIH)  ?Date Value Ref Range Status  ?03/07/2021 125 (H) 0 - 99 mg/dL Final  ? ?HDL  ?Date Value Ref Range Status  ?03/07/2021 56 >39 mg/dL Final  ? ?Triglycerides  ?Date Value Ref Range Status  ?03/07/2021 310 (H) 0 - 149 mg/dL Final  ? ?  ?  ?  Passed - Cr in normal range and within 360 days  ?  Creatinine, Ser  ?Date Value Ref Range Status  ?03/07/2021 0.85 0.57 - 1.00 mg/dL Final  ?  ?  ?  ?  Passed - Completed PHQ-2 or PHQ-9 in the last 360 days  ?  ?  Passed - Last BP in normal range  ?  BP Readings from Last 1 Encounters:  ?04/06/21 114/75  ?  ?  ?  ?  Passed - Valid encounter within last 6 months  ?  Recent Outpatient Visits   ? ?      ? 3 months ago Cellulitis of left lower extremity  ? [place], [place] T, NP  ? 3 months ago Cellulitis of left lower extremity  ? [place] T, NP  ? 4 months ago Severe episode of recurrent major depressive disorder, without psychotic features ([place])  ? [place], Henrine Screws T, NP  ? 1 year ago Encounter for annual physical exam  ? [place], [place] T, NP  ? 1 year ago Severe episode of recurrent major depressive disorder, without psychotic  features ([place])  ? [facility] [place], Henrine Screws T, NP  ? ?  ?  ? ?  ?  ?  ? ?

## 2021-07-25 NOTE — Telephone Encounter (Signed)
2nd attempt- LMOM asking pt to call back to schedule fu appt. ?

## 2021-07-26 ENCOUNTER — Encounter: Payer: Self-pay | Admitting: Nurse Practitioner

## 2021-07-26 MED ORDER — TRAMADOL HCL 50 MG PO TABS: 50.0000 mg | ORAL_TABLET | Freq: Four times a day (QID) | ORAL | 0 refills | Status: DC | PRN

## 2021-07-26 NOTE — Telephone Encounter (Signed)
Pharmacy requesting to change RX to a 90 day supply instead of 30, please advise.  ?

## 2021-08-20 ENCOUNTER — Other Ambulatory Visit: Payer: Self-pay | Admitting: Nurse Practitioner

## 2021-08-22 NOTE — Telephone Encounter (Signed)
Requested Prescriptions  ?Pending Prescriptions Disp Refills  ?? hydrochlorothiazide (HYDRODIURIL) 25 MG tablet [Pharmacy Med Name: HYDROCHLOROTHIAZIDE 25 MG TAB] 90 tablet 4  ?  Sig: TAKE 1 TABLET (25 MG TOTAL) BY MOUTH DAILY.  ?  ? Cardiovascular: Diuretics - Thiazide Passed - 08/20/2021  1:47 AM  ?  ?  Passed - Cr in normal range and within 180 days  ?  Creatinine, Ser  ?Date Value Ref Range Status  ?03/07/2021 0.85 0.57 - 1.00 mg/dL Final  ?   ?  ?  Passed - K in normal range and within 180 days  ?  Potassium  ?Date Value Ref Range Status  ?03/07/2021 4.2 3.5 - 5.2 mmol/L Final  ?   ?  ?  Passed - Na in normal range and within 180 days  ?  Sodium  ?Date Value Ref Range Status  ?03/07/2021 141 134 - 144 mmol/L Final  ?   ?  ?  Passed - Last BP in normal range  ?  BP Readings from Last 1 Encounters:  ?04/06/21 114/75  ?   ?  ?  Passed - Valid encounter within last 6 months  ?  Recent Outpatient Visits   ?      ? 4 months ago Cellulitis of left lower extremity  ? [place], [place] T, NP  ? 4 months ago Cellulitis of left lower extremity  ? [place], [place] T, NP  ? 5 months ago Severe episode of recurrent major depressive disorder, without psychotic features ([place])  ? [place], Henrine Screws T, NP  ? 1 year ago Encounter for annual physical exam  ? [place], [place] T, NP  ? 1 year ago Severe episode of recurrent major depressive disorder, without psychotic features ([place])  ? [facility] [place], Henrine Screws T, NP  ?  ?  ?Future Appointments   ?        ? In 2 weeks Cannady, Barbaraann Faster, NP [facility], [facility]  ?  ? ?  ?  ?  ?? benazepril (LOTENSIN) 40 MG tablet [Pharmacy Med Name: BENAZEPRIL HCL 40 MG TABLET] 90 tablet 4  ?  Sig: TAKE 1 TABLET BY MOUTH EVERY DAY  ?  ? Cardiovascular:  ACE Inhibitors Passed - 08/20/2021  1:47 AM  ?  ?  Passed - Cr in normal range and within 180 days  ?  Creatinine, Ser  ?Date Value Ref  Range Status  ?03/07/2021 0.85 0.57 - 1.00 mg/dL Final  ?   ?  ?  Passed - K in normal range and within 180 days  ?  Potassium  ?Date Value Ref Range Status  ?03/07/2021 4.2 3.5 - 5.2 mmol/L Final  ?   ?  ?  Passed - Patient is not pregnant  ?  ?  Passed - Last BP in normal range  ?  BP Readings from Last 1 Encounters:  ?04/06/21 114/75  ?   ?  ?  Passed - Valid encounter within last 6 months  ?  Recent Outpatient Visits   ?      ? 4 months ago Cellulitis of left lower extremity  ? [place], [place]ena T, NP  ? 4 months ago Cellulitis of left lower extremity  ? [place], [place] T, NP  ? 5 months ago Severe episode of recurrent major depressive disorder, without psychotic features ([place])  ? [facility] [place], [place]  T, NP  ? 1 year ago Encounter for annual physical exam  ? [place], [place] T, NP  ? 1 year ago Severe episode of recurrent major depressive disorder, without psychotic features ([place])  ? [facility] [place], Henrine Screws T, NP  ?  ?  ?Future Appointments   ?        ? In 2 weeks Cannady, Barbaraann Faster, NP [facility]  ?  ? ?  ?  ?  ? ? ?

## 2021-09-03 DIAGNOSIS — K760 Fatty (change of) liver, not elsewhere classified: Secondary | ICD-10-CM | POA: Insufficient documentation

## 2021-09-03 NOTE — Patient Instructions (Incomplete)
Healthy Eating ?Following a healthy eating pattern may help you to achieve and maintain a healthy body weight, reduce the risk of chronic disease, and live a long and productive life. It is important to follow a healthy eating pattern at an appropriate calorie level for your body. Your nutritional needs should be met primarily through food by choosing a variety of nutrient-rich foods. ?What are tips for following this plan? ?Reading food labels ?Read labels and choose the following: ?Reduced or low sodium. ?Juices with 100% fruit juice. ?Foods with low saturated fats and high polyunsaturated and monounsaturated fats. ?Foods with whole grains, such as whole wheat, cracked wheat, brown rice, and wild rice. ?Whole grains that are fortified with folic acid. This is recommended for women who are pregnant or who want to become pregnant. ?Read labels and avoid the following: ?Foods with a lot of added sugars. These include foods that contain brown sugar, corn sweetener, corn syrup, dextrose, fructose, glucose, high-fructose corn syrup, honey, invert sugar, lactose, malt syrup, maltose, molasses, raw sugar, sucrose, trehalose, or turbinado sugar. ?Do not eat more than the following amounts of added sugar per day: ?6 teaspoons (25 g) for women. ?9 teaspoons (38 g) for men. ?Foods that contain processed or refined starches and grains. ?Refined grain products, such as white flour, degermed cornmeal, white bread, and white rice. ?Shopping ?Choose nutrient-rich snacks, such as vegetables, whole fruits, and nuts. Avoid high-calorie and high-sugar snacks, such as potato chips, fruit snacks, and candy. ?Use oil-based dressings and spreads on foods instead of solid fats such as butter, stick margarine, or cream cheese. ?Limit pre-made sauces, mixes, and "instant" products such as flavored rice, instant noodles, and ready-made pasta. ?Try more plant-protein sources, such as tofu, tempeh, black beans, edamame, lentils, nuts, and  seeds. ?Explore eating plans such as the Mediterranean diet or vegetarian diet. ?Cooking ?Use oil to saut? or stir-fry foods instead of solid fats such as butter, stick margarine, or lard. ?Try baking, boiling, grilling, or broiling instead of frying. ?Remove the fatty part of meats before cooking. ?Steam vegetables in water or broth. ?Meal planning ? ?At meals, imagine dividing your plate into fourths: ?One-half of your plate is fruits and vegetables. ?One-fourth of your plate is whole grains. ?One-fourth of your plate is protein, especially lean meats, poultry, eggs, tofu, beans, or nuts. ?Include low-fat dairy as part of your daily diet. ?Lifestyle ?Choose healthy options in all settings, including home, work, school, restaurants, or stores. ?Prepare your food safely: ?Wash your hands after handling raw meats. ?Keep food preparation surfaces clean by regularly washing with hot, soapy water. ?Keep raw meats separate from ready-to-eat foods, such as fruits and vegetables. ?Cook seafood, meat, poultry, and eggs to the recommended internal temperature. ?Store foods at safe temperatures. In general: ?Keep cold foods at 40?F (4.4?C) or below. ?Keep hot foods at 140?F (60?C) or above. ?Keep your freezer at 0?F (-17.8?C) or below. ?Foods are no longer safe to eat when they have been between the temperatures of 40?-140?F (4.4-60?C) for more than 2 hours. ?What foods should I eat? ?Fruits ?Aim to eat 2 cup-equivalents of fresh, canned (in natural juice), or frozen fruits each day. Examples of 1 cup-equivalent of fruit include 1 small apple, 8 large strawberries, 1 cup canned fruit, ? cup dried fruit, or 1 cup 100% juice. ?Vegetables ?Aim to eat 2?-3 cup-equivalents of fresh and frozen vegetables each day, including different varieties and colors. Examples of 1 cup-equivalent of vegetables include 2 medium carrots, 2 cups raw,  leafy greens, 1 cup chopped vegetable (raw or cooked), or 1 medium baked potato. ?Grains ?Aim to  eat 6 ounce-equivalents of whole grains each day. Examples of 1 ounce-equivalent of grains include 1 slice of bread, 1 cup ready-to-eat cereal, 3 cups popcorn, or ? cup cooked rice, pasta, or cereal. ?Meats and other proteins ?Aim to eat 5-6 ounce-equivalents of protein each day. Examples of 1 ounce-equivalent of protein include 1 egg, 1/2 cup nuts or seeds, or 1 tablespoon (16 g) peanut butter. A cut of meat or fish that is the size of a deck of cards is about 3-4 ounce-equivalents. ?Of the protein you eat each week, try to have at least 8 ounces come from seafood. This includes salmon, trout, herring, and anchovies. ?Dairy ?Aim to eat 3 cup-equivalents of fat-free or low-fat dairy each day. Examples of 1 cup-equivalent of dairy include 1 cup (240 mL) milk, 8 ounces (250 g) yogurt, 1? ounces (44 g) natural cheese, or 1 cup (240 mL) fortified soy milk. ?Fats and oils ?Aim for about 5 teaspoons (21 g) per day. Choose monounsaturated fats, such as canola and olive oils, avocados, peanut butter, and most nuts, or polyunsaturated fats, such as sunflower, corn, and soybean oils, walnuts, pine nuts, sesame seeds, sunflower seeds, and flaxseed. ?Beverages ?Aim for six 8-oz glasses of water per day. Limit coffee to three to five 8-oz cups per day. ?Limit caffeinated beverages that have added calories, such as soda and energy drinks. ?Limit alcohol intake to no more than 1 drink a day for nonpregnant women and 2 drinks a day for men. One drink equals 12 oz of beer (355 mL), 5 oz of wine (148 mL), or 1? oz of hard liquor (44 mL). ?Seasoning and other foods ?Avoid adding excess amounts of salt to your foods. Try flavoring foods with herbs and spices instead of salt. ?Avoid adding sugar to foods. ?Try using oil-based dressings, sauces, and spreads instead of solid fats. ?This information is based on general [place] nutrition guidelines. For more information, visit choosemyplate.gov. Exact amounts may vary based on your nutrition  needs. ?Summary ?A healthy eating plan may help you to maintain a healthy weight, reduce the risk of chronic diseases, and stay active throughout your life. ?Plan your meals. Make sure you eat the right portions of a variety of nutrient-rich foods. ?Try baking, boiling, grilling, or broiling instead of frying. ?Choose healthy options in all settings, including home, work, school, restaurants, or stores. ?This information is not intended to replace advice given to you by your health care provider. Make sure you discuss any questions you have with your health care provider. ?Document Revised: 12/21/2020 Document Reviewed: 12/21/2020 ?Elsevier Patient Education ? 2023 Elsevier Inc. ? ?

## 2021-09-06 ENCOUNTER — Ambulatory Visit: Payer: BC Managed Care – PPO | Admitting: Nurse Practitioner

## 2021-09-06 ENCOUNTER — Encounter: Payer: Self-pay | Admitting: Nurse Practitioner

## 2021-09-06 DIAGNOSIS — F411 Generalized anxiety disorder: Secondary | ICD-10-CM

## 2021-09-06 DIAGNOSIS — I10 Essential (primary) hypertension: Secondary | ICD-10-CM

## 2021-09-06 DIAGNOSIS — F332 Major depressive disorder, recurrent severe without psychotic features: Secondary | ICD-10-CM

## 2021-09-06 DIAGNOSIS — G8929 Other chronic pain: Secondary | ICD-10-CM

## 2021-09-06 DIAGNOSIS — K760 Fatty (change of) liver, not elsewhere classified: Secondary | ICD-10-CM

## 2021-09-06 DIAGNOSIS — E6609 Other obesity due to excess calories: Secondary | ICD-10-CM

## 2021-09-06 DIAGNOSIS — F5101 Primary insomnia: Secondary | ICD-10-CM

## 2021-09-06 DIAGNOSIS — E559 Vitamin D deficiency, unspecified: Secondary | ICD-10-CM

## 2021-09-06 DIAGNOSIS — E7800 Pure hypercholesterolemia, unspecified: Secondary | ICD-10-CM

## 2021-09-06 DIAGNOSIS — R7989 Other specified abnormal findings of blood chemistry: Secondary | ICD-10-CM

## 2021-09-06 DIAGNOSIS — N1831 Chronic kidney disease, stage 3a: Secondary | ICD-10-CM

## 2021-09-29 ENCOUNTER — Other Ambulatory Visit: Payer: Self-pay

## 2021-09-29 ENCOUNTER — Telehealth: Payer: Self-pay

## 2021-09-29 DIAGNOSIS — Z1231 Encounter for screening mammogram for malignant neoplasm of breast: Secondary | ICD-10-CM

## 2021-09-29 NOTE — Telephone Encounter (Signed)
Called and LVM asking for patient to please return my call. Patient is due for her mammogram.  OK for [facility] to find out if the patient if OK with having this ordered and scheduled if the patient calls back.

## 2021-09-29 NOTE — Patient Instructions (Addendum)
Look into [employer]   Look into [facility] for weight loss.  Chronic Kidney Disease, Adult Chronic kidney disease is when lasting damage happens to the kidneys slowly over a long time. The kidneys help to: Make pee (urine). Make hormones. Keep the right amount of fluids and chemicals in the body. Most often, this disease does not go away. You must take steps to help keep the kidney damage from getting worse. If steps are not taken, the kidneys might stop working forever. What are the causes? Diabetes. High blood pressure. Diseases that affect the heart and blood vessels. Other kidney diseases. Diseases of the body's disease-fighting system. A problem with the flow of pee. Infections of the organs that make pee, store it, and take it out of the body. Swelling or irritation of your blood vessels. What increases the risk? Getting older. Having someone in your family who has kidney disease or kidney failure. Having a disease caused by genes. Taking medicines often that harm the kidneys. Being near or having contact with harmful substances. Being very overweight. Using tobacco now or in the past. What are the signs or symptoms? Feeling very tired. Having a swollen face, legs, ankles, or feet. Feeling like you may vomit or vomiting. Not feeling hungry. Being confused or not able to focus. Twitches and cramps in the leg muscles or other muscles. Dry, itchy skin. A taste of metal in your mouth. Making less pee, or making more pee. Shortness of breath. Trouble sleeping. You may also become anemic or get weak bones. Anemic means there is not enough red blood cells or hemoglobin in your blood. You may get symptoms slowly. You may not notice them until the kidney damage gets very bad. How is this treated? Often, there is no cure for this disease. Treatment can help with symptoms and help keep the disease from getting worse. You may need to: Avoid alcohol. Avoid foods that are high in  salt, potassium, phosphorous, and protein. Take medicines for symptoms and to help control other conditions. Have dialysis. This treatment gets harmful waste out of your body. Treat other problems that cause your kidney disease or make it worse. Follow these instructions at home: Medicines Take over-the-counter and prescription medicines only as told by your doctor. Do not take any new medicines, vitamins, or supplements unless your doctor says it is okay. Lifestyle  Do not smoke or use any products that contain nicotine or tobacco. If you need help quitting, ask your doctor. If you drink alcohol: Limit how much you use to: 0-1 drink a day for women who are not pregnant. 0-2 drinks a day for men. Know how much alcohol is in your drink. In the [place] one drink equals one 12 oz bottle of beer (355 mL), one 5 oz glass of wine (148 mL), or one 1 oz glass of hard liquor (44 mL). Stay at a healthy weight. If you need help losing weight, ask your doctor. General instructions  Follow instructions from your doctor about what you cannot eat or drink. Track your blood pressure at home. Tell your doctor about any changes. If you have diabetes, track your blood sugar. Exercise at least 30 minutes a day, 5 days a week. Keep your shots (vaccinations) up to date. Keep all follow-up visits. Where to find more information American Association of Kidney Patients: BombTimer.gl National Kidney Foundation: www.kidney.[place]: https://mathis.com/ Life Options: www.lifeoptions.org Kidney School: www.kidneyschool.org Contact a doctor if: Your symptoms get worse. You get new symptoms. Get  help right away if: You get symptoms of end-stage kidney disease. These include: Headaches. Losing feeling in your hands or feet. Easy bruising. Having hiccups often. Chest pain. Shortness of breath. Lack of menstrual periods, in women. You have a fever. You make less pee than normal. You have pain or  you bleed when you pee or poop. These symptoms may be an emergency. Get help right away. Call your local emergency services (911 in the [place] Do not wait to see if the symptoms will go away. Do not drive yourself to the hospital. Summary Chronic kidney disease is when lasting damage happens to the kidneys slowly over a long time. Causes of this disease include diabetes and high blood pressure. Often, there is no cure for this disease. Treatment can help symptoms and help keep the disease from getting worse. Treatment may involve lifestyle changes, medicines, and dialysis. This information is not intended to replace advice given to you by your health care provider. Make sure you discuss any questions you have with your health care provider. Document Revised: 07/30/2019 Document Reviewed: 07/30/2019 Elsevier Patient Education  [place].

## 2021-09-30 ENCOUNTER — Encounter: Payer: Self-pay | Admitting: Nurse Practitioner

## 2021-09-30 ENCOUNTER — Ambulatory Visit (INDEPENDENT_AMBULATORY_CARE_PROVIDER_SITE_OTHER): Payer: BC Managed Care – PPO | Admitting: Nurse Practitioner

## 2021-09-30 VITALS — BP 121/80 | HR 92 | Temp 98.2°F | Ht 68.0 in | Wt 206.0 lb

## 2021-09-30 DIAGNOSIS — E66811 Obesity, class 1: Secondary | ICD-10-CM

## 2021-09-30 DIAGNOSIS — E7800 Pure hypercholesterolemia, unspecified: Secondary | ICD-10-CM | POA: Diagnosis not present

## 2021-09-30 DIAGNOSIS — E559 Vitamin D deficiency, unspecified: Secondary | ICD-10-CM

## 2021-09-30 DIAGNOSIS — F332 Major depressive disorder, recurrent severe without psychotic features: Secondary | ICD-10-CM

## 2021-09-30 DIAGNOSIS — Z6830 Body mass index (BMI) 30.0-30.9, adult: Secondary | ICD-10-CM

## 2021-09-30 DIAGNOSIS — K760 Fatty (change of) liver, not elsewhere classified: Secondary | ICD-10-CM

## 2021-09-30 DIAGNOSIS — N1831 Chronic kidney disease, stage 3a: Secondary | ICD-10-CM | POA: Diagnosis not present

## 2021-09-30 DIAGNOSIS — F1090 Alcohol use, unspecified, uncomplicated: Secondary | ICD-10-CM

## 2021-09-30 DIAGNOSIS — E6609 Other obesity due to excess calories: Secondary | ICD-10-CM

## 2021-09-30 DIAGNOSIS — R7989 Other specified abnormal findings of blood chemistry: Secondary | ICD-10-CM

## 2021-09-30 DIAGNOSIS — F411 Generalized anxiety disorder: Secondary | ICD-10-CM

## 2021-09-30 DIAGNOSIS — I10 Essential (primary) hypertension: Secondary | ICD-10-CM

## 2021-09-30 DIAGNOSIS — Z789 Other specified health status: Secondary | ICD-10-CM | POA: Insufficient documentation

## 2021-09-30 DIAGNOSIS — F5101 Primary insomnia: Secondary | ICD-10-CM

## 2021-09-30 LAB — MICROALBUMIN, URINE WAIVED
Creatinine, Urine Waived: 50 mg/dL (ref 10–300)
Microalb, Ur Waived: 10 mg/L (ref 0–19)
Microalb/Creat Ratio: <30 mg/g (ref <30)

## 2021-09-30 MED ORDER — WEGOVY 0.25 MG/0.5ML ~~LOC~~ SOAJ: 0.2500 mg | SUBCUTANEOUS | 1 refills | Status: DC

## 2021-09-30 MED ORDER — NALTREXONE HCL 50 MG PO TABS: ORAL_TABLET | ORAL | 4 refills | Status: DC

## 2021-09-30 MED ORDER — TRAZODONE HCL 50 MG PO TABS: 25.0000 mg | ORAL_TABLET | Freq: Every evening | ORAL | 4 refills | Status: DC | PRN

## 2021-09-30 NOTE — Telephone Encounter (Signed)
See my chart message

## 2021-09-30 NOTE — Assessment & Plan Note (Signed)
Ongoing on labs.  At this time LDL <190 and ASCVD 2.1%.  Continue diet and exercise focus -- lipid panel today.

## 2021-09-30 NOTE — Addendum Note (Signed)
Addended by: Marnee Guarneri T on: 09/30/2021 05:00 PM   Modules accepted: Orders

## 2021-09-30 NOTE — Assessment & Plan Note (Signed)
Ongoing.  Noted on past labs, recheck today and adjust supplement as needed. 

## 2021-09-30 NOTE — Assessment & Plan Note (Signed)
Noted on imaging in January 2023 -- mild elevation LFTs recent check.  Recommend complete cessation alcohol use and avoid Tylenol.  Recheck levels today.

## 2021-09-30 NOTE — Assessment & Plan Note (Signed)
Stable, trending down. CMP and GGT today.  Recommend alcohol use cessation.

## 2021-09-30 NOTE — Assessment & Plan Note (Signed)
She is concerned about increased use, will start Naltrexone, discussed with patient.  Recommend she attend therapy sessions, discussed [employer] via telephone for therapy.

## 2021-09-30 NOTE — Assessment & Plan Note (Signed)
Refer to depression plan of care. 

## 2021-09-30 NOTE — Assessment & Plan Note (Addendum)
BMI 31.32.  Has tried for 6 months or more with weight loss medication, exercise, and diet -- no benefits.  Will trial Wegovy at this time.  Educated patient at length on this.  Recommended eating smaller high protein, low fat meals more frequently and exercising 30 mins a day 5 times a week with a goal of 10-15lb weight loss in the next 3 months. Patient voiced their understanding and motivation to adhere to these recommendations.

## 2021-09-30 NOTE — Assessment & Plan Note (Signed)
Chronic, ongoing.  Continue Trazodone as offers benefit to sleep. Refills sent. Recommend focus on healthy sleep hygiene.

## 2021-09-30 NOTE — Assessment & Plan Note (Signed)
Chronic, ongoing.  BP at goal today in office, some elevations at home but increased anxiety there at this time.  Continue current medication regimen, HCTZ and Benazepril, adjust as needed.  Could consider addition of BB if return of palpitations.  Recommend she monitor BP at home at least a few days a week and document.  DASH diet recommended.  CMP, lipid, TSH today.

## 2021-09-30 NOTE — Assessment & Plan Note (Signed)
Noted on past labs, urine ALB last check was 10 and blood work improved.  Continue Benazepril for kidney protection.  Recommend increased water intake at home and decrease Ibuprofen use.  Recheck CMP today.

## 2021-09-30 NOTE — Progress Notes (Addendum)
BP 121/80   Pulse 92   Temp 98.2 F (36.8 C) (Oral)   Ht '5\' 8"'$  (1.727 m)   Wt 206 lb (93.4 kg)   SpO2 96%   BMI 31.32 kg/m    Subjective:    Patient ID: Alexandra Le, female    DOB: 1971/02/24, 51 y.o.   MRN: [id number]  HPI: Alexandra Le is a 51 y.o. female  Chief Complaint  Patient presents with   Chronic Kidney Disease   Anxiety   Hypertension   Medication Refill    Patient is requesting a refill on her Trazodone prescription at today's visit.    HYPERTENSION Continues Benazepril and HCTZ -- tries not to take HCTZ daily, every three days.  Has not taken medication this morning.  Continues to work on weight loss, was seeing weight loss clinic and on Phentermine, but had minimal benefit with this.  Over past 6 months has tried exercising, medication, and diet changes without benefit.  Discussed Wegovy with her, which she would like to try.  No family history of thyroid cancer or pancreatitis. Hypertension status: stable  Satisfied with current treatment? yes Duration of hypertension: chronic BP monitoring frequency:  a few times a week BP range: higher at home -- 150/100 -- more stressed at home BP medication side effects:  no Medication compliance: good compliance Aspirin: no Recurrent headaches: no Visual changes: no Palpitations: no Dyspnea: occasional Chest pain: with anxiety Lower extremity edema: no Dizzy/lightheaded: no  The 10-year ASCVD risk score (Arnett DK, et al., 2019) is: 2.1%   Values used to calculate the score:     Age: 26 years     Sex: Female     Is Non-Hispanic African American: No     Diabetic: No     Tobacco smoker: No     Systolic Blood Pressure: 364 mmHg     Is BP treated: Yes     HDL Cholesterol: 56 mg/dL     Total Cholesterol: 235 mg/dL   CHRONIC KIDNEY DISEASE Has been noted on past labs, but recent improved. Have noticed elevation LFTs in past, recent improved.  Had ultrasound in January 2023 noting hepatic  steatosis. CKD status: stable Medications renally dose: yes Previous renal evaluation: no Pneumovax:  Not up to Date Influenza Vaccine:  Up to Date   DEPRESSION/ANXIETY Continues on Effexor XR 75 MG daily + Trazodone 25-50 MG nightly (has been out of this for awhile and not sleeping well).  Has had a lot of stressors recently personal and work.  Concerned as alcohol intake increased -- is drinking every other day -- has a good three drinks -- would like medication to stop this.  No history of heavier alcohol use personally, does have a biological uncle that was alcoholic.  Adopted so does not know full history.   Mood status: stable Satisfied with current treatment?: yes Symptom severity: moderate  Duration of current treatment : chronic Side effects: no Medication compliance: good compliance Psychotherapy/counseling: yes in the past Depressed mood: yes Anxious mood: yes Anhedonia: no Significant weight loss or gain: no Insomnia: yes hard to fall asleep Fatigue: yes Feelings of worthlessness or guilt: yes Impaired concentration/indecisiveness: no Suicidal ideations: yes -- no plan or SI today, has children she would not leave as her children's husband did this -- reports safe plan Hopelessness: yes Crying spells: yes    09/30/2021   10:37 AM 04/06/2021    4:29 PM 03/07/2021    3:16 PM 06/24/2020  9:35 AM 12/12/2019    3:21 PM  Depression screen PHQ 2/9  Decreased Interest 1 0 0 0 0  Down, Depressed, Hopeless 1 0 3 0 0  PHQ - 2 Score 2 0 3 0 0  Altered sleeping 2 1 0 0 3  Tired, decreased energy 2 1 0 0 3  Change in appetite 2 0 0 0 3  Feeling bad or failure about yourself  1 0 0 0 0  Trouble concentrating 1 1 0 0 1  Moving slowly or fidgety/restless 2 0 0 0 0  Suicidal thoughts 1 0 0 0 0  PHQ-9 Score '13 3 3 '$ 0 10  Difficult doing work/chores   Not difficult at all  Not difficult at all       09/30/2021   10:37 AM 04/06/2021    4:30 PM 01/17/2019    8:52 AM 01/03/2019     8:24 AM  GAD 7 : Generalized Anxiety Score  Nervous, Anxious, on Edge 1 0 1 1  Control/stop worrying 2 0 1 1  Worry too much - different things 2 0 1 2  Trouble relaxing 1 0 1 3  Restless 2 0 1 3  Easily annoyed or irritable '3 1 1 3  '$ Afraid - awful might happen 1 0 0 1  Total GAD 7 Score '12 1 6 14  '$ Anxiety Difficulty Somewhat difficult  Somewhat difficult Somewhat difficult   Relevant past medical, surgical, family and social history reviewed and updated as indicated. Interim medical history since our last visit reviewed. Allergies and medications reviewed and updated.  Review of Systems  Constitutional:  Negative for activity change, appetite change, diaphoresis, fatigue and fever.  Respiratory:  Negative for cough, chest tightness and shortness of breath.   Cardiovascular:  Negative for chest pain, palpitations and leg swelling.  Gastrointestinal: Negative.   Neurological: Negative.   Psychiatric/Behavioral:  Positive for sleep disturbance. Negative for decreased concentration, self-injury and suicidal ideas (denies SI today, fleeting thoughts on occasion, but no plan and has safety plan). The patient is nervous/anxious.    Per HPI unless specifically indicated above     Objective:    BP 121/80   Pulse 92   Temp 98.2 F (36.8 C) (Oral)   Ht '5\' 8"'$  (1.727 m)   Wt 206 lb (93.4 kg)   SpO2 96%   BMI 31.32 kg/m   Wt Readings from Last 3 Encounters:  09/30/21 206 lb (93.4 kg)  04/06/21 201 lb (91.2 kg)  03/30/21 201 lb (91.2 kg)    Physical Exam Vitals and nursing note reviewed.  Constitutional:      Appearance: She is well-developed.  HENT:     Head: Normocephalic.  Eyes:     General:        Right eye: No discharge.        Left eye: No discharge.     Conjunctiva/sclera: Conjunctivae normal.     Pupils: Pupils are equal, round, and reactive to light.  Neck:     Thyroid: No thyromegaly.     Vascular: No carotid bruit or JVD.  Cardiovascular:     Rate and Rhythm:  Normal rate and regular rhythm.     Heart sounds: Normal heart sounds. No murmur heard.   No gallop.  Pulmonary:     Effort: Pulmonary effort is normal.     Breath sounds: Normal breath sounds.  Abdominal:     General: Bowel sounds are normal. There is no distension.  Palpations: Abdomen is soft. There is no hepatomegaly.     Tenderness: There is no abdominal tenderness.  Musculoskeletal:     Cervical back: Normal range of motion and neck supple.     Right lower leg: No edema.     Left lower leg: No edema.  Lymphadenopathy:     Cervical: No cervical adenopathy.  Skin:    General: Skin is warm and dry.  Neurological:     Mental Status: She is alert and oriented to person, place, and time.  Psychiatric:        Attention and Perception: Attention normal.        Mood and Affect: Mood normal.        Speech: Speech normal.        Behavior: Behavior normal.        Thought Content: Thought content normal.   Results for orders placed or performed in visit on 09/30/21  Microalbumin, Urine Waived  Result Value Ref Range   Microalb, Ur Waived 10 0 - 19 mg/L   Creatinine, Urine Waived 50 10 - 300 mg/dL   Microalb/Creat Ratio <30 <30 mg/g      Assessment & Plan:   Problem List Items Addressed This Visit       Cardiovascular and Mediastinum   Hypertension    Chronic, ongoing.  BP at goal today in office, some elevations at home but increased anxiety there at this time.  Continue current medication regimen, HCTZ and Benazepril, adjust as needed.  Could consider addition of BB if return of palpitations.  Recommend she monitor BP at home at least a few days a week and document.  DASH diet recommended.  CMP, lipid, TSH today.         Relevant Orders   Comprehensive metabolic panel   TSH   Microalbumin, Urine Waived (Completed)     Digestive   Hepatic steatosis    Noted on imaging in January 2023 -- mild elevation LFTs recent check.  Recommend complete cessation alcohol use and  avoid Tylenol.  Recheck levels today.       Relevant Orders   Comprehensive metabolic panel     Genitourinary   Chronic kidney disease, stage 3a ([place])    Noted on past labs, urine ALB last check was 10 and blood work improved.  Continue Benazepril for kidney protection.  Recommend increased water intake at home and decrease Ibuprofen use.  Recheck CMP today.       Relevant Orders   Comprehensive metabolic panel   Microalbumin, Urine Waived (Completed)     Other   Alcohol use    She is concerned about increased use, will start Naltrexone, discussed with patient.  Recommend she attend therapy sessions, discussed [facility] via telephone for therapy.       Depression - Primary    Chronic, exacerbated by multiple work and family stressors.  She denies SI/HI today, but reports fleeting thoughts on occasion with no plan and has safe plan.  Will alert PCP if any increased thoughts or immediately go to ER.  Will continue current medication regimen and adjust as needed, with exception of adding on Naltrexone to help reduce alcohol use.  Return in 6 weeks, sooner if worsening mood.       Relevant Medications   traZODone (DESYREL) 50 MG tablet   Elevated LFTs    Stable, trending down. CMP and GGT today.  Recommend alcohol use cessation.       Relevant Orders   Comprehensive  metabolic panel   Gamma GT   Elevated low density lipoprotein (LDL) cholesterol level    Ongoing on labs.  At this time LDL <190 and ASCVD 2.1%.  Continue diet and exercise focus -- lipid panel today.        Relevant Orders   Lipid Panel w/o Chol/HDL Ratio   Generalized anxiety disorder    Refer to depression plan of care.       Relevant Medications   traZODone (DESYREL) 50 MG tablet   Insomnia    Chronic, ongoing.  Continue Trazodone as offers benefit to sleep. Refills sent. Recommend focus on healthy sleep hygiene.         Obesity    BMI 31.32.  Has tried for 6 months or more with weight loss  medication, exercise, and diet -- no benefits.  Will trial Wegovy at this time.  Educated patient at length on this.  Recommended eating smaller high protein, low fat meals more frequently and exercising 30 mins a day 5 times a week with a goal of 10-15lb weight loss in the next 3 months. Patient voiced their understanding and motivation to adhere to these recommendations.        Relevant Medications   Semaglutide-Weight Management (WEGOVY) 0.25 MG/0.5ML SOAJ   Vitamin D deficiency    Ongoing.  Noted on past labs, recheck today and adjust supplement as needed.       Relevant Orders   VITAMIN D 25 Hydroxy (Vit-D Deficiency, Fractures)     Follow up plan: Return in about 6 weeks (around 11/11/2021) for Follow-up on mood and alcohol use.

## 2021-09-30 NOTE — Assessment & Plan Note (Signed)
Chronic, exacerbated by multiple work and family stressors.  She denies SI/HI today, but reports fleeting thoughts on occasion with no plan and has safe plan.  Will alert PCP if any increased thoughts or immediately go to ER.  Will continue current medication regimen and adjust as needed, with exception of adding on Naltrexone to help reduce alcohol use.  Return in 6 weeks, sooner if worsening mood.

## 2021-10-01 LAB — LIPID PANEL W/O CHOL/HDL RATIO
Cholesterol, Total: 260 mg/dL — ABNORMAL HIGH (ref 100–199)
HDL: 60 mg/dL (ref >39)
LDL Chol Calc (NIH): 155 mg/dL — ABNORMAL HIGH (ref 0–99)
Triglycerides: 248 mg/dL — ABNORMAL HIGH (ref 0–149)
VLDL Cholesterol Cal: 45 mg/dL — ABNORMAL HIGH (ref 5–40)

## 2021-10-01 LAB — COMPREHENSIVE METABOLIC PANEL
ALT: 88 IU/L — ABNORMAL HIGH (ref 0–32)
AST: 75 IU/L — ABNORMAL HIGH (ref 0–40)
Albumin/Globulin Ratio: 1.7 (ref 1.2–2.2)
Albumin: 4.7 g/dL (ref 3.8–4.9)
Alkaline Phosphatase: 122 IU/L — ABNORMAL HIGH (ref 44–121)
BUN/Creatinine Ratio: 10 (ref 9–23)
BUN: 10 mg/dL (ref 6–24)
Bilirubin Total: 0.4 mg/dL (ref 0.0–1.2)
CO2: 24 mmol/L (ref 20–29)
Calcium: 9.7 mg/dL (ref 8.7–10.2)
Chloride: 99 mmol/L (ref 96–106)
Creatinine, Ser: 0.96 mg/dL (ref 0.57–1.00)
Globulin, Total: 2.7 g/dL (ref 1.5–4.5)
Glucose: 80 mg/dL (ref 70–99)
Potassium: 4.2 mmol/L (ref 3.5–5.2)
Sodium: 140 mmol/L (ref 134–144)
Total Protein: 7.4 g/dL (ref 6.0–8.5)
eGFR: 71 mL/min/{1.73_m2} (ref >59)

## 2021-10-01 LAB — VITAMIN D 25 HYDROXY (VIT D DEFICIENCY, FRACTURES): Vit D, 25-Hydroxy: 34.5 ng/mL (ref 30.0–100.0)

## 2021-10-01 LAB — TSH: TSH: 1.3 u[IU]/mL (ref 0.450–4.500)

## 2021-10-01 LAB — GAMMA GT: GGT: 102 IU/L — ABNORMAL HIGH (ref 0–60)

## 2021-10-02 NOTE — Progress Notes (Signed)
Contacted via [place] The 10-year ASCVD risk score (Arnett DK, et al., 2019) is: 2.2%   Values used to calculate the score:     Age: 51 years     Sex: Female     Is Non-Hispanic African American: No     Diabetic: No     Tobacco smoker: No     Systolic Blood Pressure: 196 mmHg     Is BP treated: Yes     HDL Cholesterol: 60 mg/dL     Total Cholesterol: 260 mg/dL   Good evening Alexandra Le, your labs have returned: - Kidney function, creatinine and eGFR, remains normal.  Liver function has trended up from past visit, as has GGT - please cut back on alcohol use and no Tylenol, how is Naltrexone working for you? - Thyroid and Vitamin D levels are normal. - Your cholesterol is still high, but continued recommendations to make lifestyle changes. Your LDL is above normal. The LDL is the bad cholesterol. Over time and in combination with inflammation and other factors, this contributes to plaque which in turn may lead to stroke and/or heart attack down the road. Sometimes high LDL is primarily genetic, and people might be eating all the right foods but still have high numbers. Other times, there is room for improvement in one's diet and eating healthier can bring this number down and potentially reduce one's risk of heart attack and/or stroke.   To reduce your LDL, Remember - more fruits and vegetables, more fish, and limit red meat and dairy products. More soy, nuts, beans, barley, lentils, oats and plant sterol ester enriched margarine instead of butter. I also encourage eliminating sugar and processed food. Remember, shop on the outside of the grocery store and visit your [employer]. If you would like to talk with me about dietary changes for your cholesterol, please let me know. We should recheck your cholesterol in 6 months.  Any questions? Keep being wonderful!!  Thank you for allowing me to participate in your care.  I appreciate you. Kindest regards, Pratt Bress

## 2021-10-04 ENCOUNTER — Telehealth: Payer: Self-pay

## 2021-10-04 NOTE — Telephone Encounter (Signed)
Prior authorization initiated via CoverMyMeds for prescription for Wegovy 0.25 MG.   KEY: [id number]

## 2021-10-04 NOTE — Telephone Encounter (Signed)
Patient was approved via CoverMyMeds for prescription for Wegovy 0.25 MG

## 2021-10-05 ENCOUNTER — Encounter: Payer: Self-pay | Admitting: Nurse Practitioner

## 2021-10-06 ENCOUNTER — Other Ambulatory Visit: Payer: Self-pay | Admitting: Nurse Practitioner

## 2021-10-06 MED ORDER — WEGOVY 0.25 MG/0.5ML ~~LOC~~ SOAJ: 0.2500 mg | SUBCUTANEOUS | 1 refills | Status: DC

## 2021-10-07 ENCOUNTER — Other Ambulatory Visit: Payer: Self-pay | Admitting: Nurse Practitioner

## 2021-10-07 MED ORDER — WEGOVY 0.25 MG/0.5ML ~~LOC~~ SOAJ: 0.2500 mg | SUBCUTANEOUS | 1 refills | Status: DC

## 2021-10-14 ENCOUNTER — Ambulatory Visit
Admission: RE | Admit: 2021-10-14 | Discharge: 2021-10-14 | Disposition: A | Discharge status: Home / Self Care | Payer: BC Managed Care – PPO | Source: Ambulatory Visit | Attending: Nurse Practitioner | Admitting: Nurse Practitioner

## 2021-10-14 DIAGNOSIS — Z1231 Encounter for screening mammogram for malignant neoplasm of breast: Secondary | ICD-10-CM | POA: Diagnosis present

## 2021-10-17 MED ORDER — SAXENDA 18 MG/3ML ~~LOC~~ SOPN: PEN_INJECTOR | SUBCUTANEOUS | 0 refills | Status: DC

## 2021-10-17 NOTE — Addendum Note (Signed)
Addended by: Marnee Guarneri T on: 10/17/2021 04:03 PM   Modules accepted: Orders

## 2021-10-17 NOTE — Progress Notes (Signed)
Contacted via MyChart   Normal mammogram, may repeat in one year:)

## 2021-10-27 ENCOUNTER — Other Ambulatory Visit: Payer: Self-pay

## 2021-10-27 MED ORDER — VENLAFAXINE HCL ER 75 MG PO CP24: 75.0000 mg | ORAL_CAPSULE | Freq: Every day | ORAL | 5 refills | Status: DC

## 2021-10-27 MED ORDER — TRAZODONE HCL 50 MG PO TABS: 25.0000 mg | ORAL_TABLET | Freq: Every evening | ORAL | 4 refills | Status: DC | PRN

## 2021-10-28 ENCOUNTER — Other Ambulatory Visit: Payer: Self-pay | Admitting: Nurse Practitioner

## 2021-10-28 MED ORDER — BENAZEPRIL HCL 40 MG PO TABS: 40.0000 mg | ORAL_TABLET | Freq: Every day | ORAL | 4 refills | Status: DC

## 2021-11-09 ENCOUNTER — Other Ambulatory Visit: Payer: Self-pay

## 2021-11-09 ENCOUNTER — Encounter: Payer: Self-pay | Admitting: Nurse Practitioner

## 2021-11-09 MED ORDER — NOVOFINE PEN NEEDLE 32G X 6 MM MISC: 1.0000 | Freq: Every day | 4 refills | Status: DC

## 2021-11-10 MED ORDER — INSULIN PEN NEEDLE 29G X 12.7MM MISC: 2 refills | Status: DC

## 2021-11-11 ENCOUNTER — Ambulatory Visit: Payer: BC Managed Care – PPO | Admitting: Nurse Practitioner

## 2021-11-11 DIAGNOSIS — Z789 Other specified health status: Secondary | ICD-10-CM

## 2021-11-11 DIAGNOSIS — E6609 Other obesity due to excess calories: Secondary | ICD-10-CM

## 2021-11-11 DIAGNOSIS — F332 Major depressive disorder, recurrent severe without psychotic features: Secondary | ICD-10-CM

## 2021-11-11 DIAGNOSIS — F411 Generalized anxiety disorder: Secondary | ICD-10-CM

## 2021-11-11 DIAGNOSIS — R7989 Other specified abnormal findings of blood chemistry: Secondary | ICD-10-CM

## 2021-11-18 ENCOUNTER — Other Ambulatory Visit: Payer: Self-pay | Admitting: Nurse Practitioner

## 2021-11-18 NOTE — Telephone Encounter (Signed)
Call to patient- she states she does not need refills on either one- ok to disregard. HCTZ she takes as needed.  Requested Prescriptions  Pending Prescriptions Disp Refills  . benazepril (LOTENSIN) 40 MG tablet [Pharmacy Med Name: BENAZEPRIL HCL 40 MG TABLET] 90 tablet 4    Sig: TAKE 1 TABLET BY MOUTH EVERY DAY     Cardiovascular:  ACE Inhibitors Passed - 11/18/2021  2:29 AM      Passed - Cr in normal range and within 180 days    Creatinine, Ser  Date Value Ref Range Status  09/30/2021 0.96 0.57 - 1.00 mg/dL Final         Passed - K in normal range and within 180 days    Potassium  Date Value Ref Range Status  09/30/2021 4.2 3.5 - 5.2 mmol/L Final         Passed - Patient is not pregnant      Passed - Last BP in normal range    BP Readings from Last 1 Encounters:  09/30/21 121/80         Passed - Valid encounter within last 6 months    Recent Outpatient Visits          1 month ago Severe episode of recurrent major depressive disorder, without psychotic features ([place])   [place], Jolene T, NP   7 months ago Cellulitis of left lower extremity   [place], Jolene T, NP   7 months ago Cellulitis of left lower extremity   [place], Jolene T, NP   8 months ago Severe episode of recurrent major depressive disorder, without psychotic features ([place])   [place], Barbaraann Faster, NP   1 year ago Encounter for annual physical exam   [place], Jolene T, NP             . hydrochlorothiazide (HYDRODIURIL) 25 MG tablet [Pharmacy Med Name: HYDROCHLOROTHIAZIDE 25 MG TAB] 90 tablet 0    Sig: TAKE 1 TABLET (25 MG TOTAL) BY MOUTH DAILY.     Cardiovascular: Diuretics - Thiazide Passed - 11/18/2021  2:29 AM      Passed - Cr in normal range and within 180 days    Creatinine, Ser  Date Value Ref Range Status  09/30/2021 0.96 0.57 - 1.00 mg/dL Final         Passed - K in normal  range and within 180 days    Potassium  Date Value Ref Range Status  09/30/2021 4.2 3.5 - 5.2 mmol/L Final         Passed - Na in normal range and within 180 days    Sodium  Date Value Ref Range Status  09/30/2021 140 134 - 144 mmol/L Final         Passed - Last BP in normal range    BP Readings from Last 1 Encounters:  09/30/21 121/80         Passed - Valid encounter within last 6 months    Recent Outpatient Visits          1 month ago Severe episode of recurrent major depressive disorder, without psychotic features ([place])   [place], Jolene T, NP   7 months ago Cellulitis of left lower extremity   [place], Jolene T, NP   7 months ago Cellulitis of left lower extremity   [place], Barbaraann Faster, NP   8  months ago Severe episode of recurrent major depressive disorder, without psychotic features ([place])   [place], Barbaraann Faster, NP   1 year ago Encounter for annual physical exam   [facility] [place], Barbaraann Faster, NP

## 2021-11-18 NOTE — Telephone Encounter (Signed)
Call to patient tto verify pharmacy-left message to return call- will hold briefly for call back Benazepril Rx 10/28/21 #[id number] sent to mail order- this request from local [facility].

## 2021-11-23 ENCOUNTER — Other Ambulatory Visit: Payer: Self-pay | Admitting: Nurse Practitioner

## 2021-11-24 NOTE — Telephone Encounter (Signed)
Requested medication (s) are due for refill today: Yes  Requested medication (s) are on the active medication list: Yes  Last refill:  10/17/21  Future visit scheduled: No  Notes to clinic:  Unable to refill per protocol, titration instructions, will need one dosage in new Rx      Requested Prescriptions  Pending Prescriptions Disp Refills   SAXENDA 18 MG/3ML SOPN [Pharmacy Med Name: SAXENDA  '6MG'$   INJ  ML] 15 mL 11    Sig: Inject 0.6 mg into the skin daily for 7 days, THEN 1.2 mg daily for 7 days, THEN 1.8 mg daily for 7 days, THEN 2.4 mg daily for 7 days, THEN 3 mg daily.     Endocrinology:  Diabetes - GLP-1 Receptor Agonists Failed - 11/23/2021  9:11 AM      Failed - HBA1C is between 0 and 7.9 and within 180 days    No results found for: "HGBA1C", "LABA1C"       Passed - Valid encounter within last 6 months    Recent Outpatient Visits           1 month ago Severe episode of recurrent major depressive disorder, without psychotic features ([place])   [place], Jolene T, NP   7 months ago Cellulitis of left lower extremity   [place], Jolene T, NP   7 months ago Cellulitis of left lower extremity   [place], Jolene T, NP   8 months ago Severe episode of recurrent major depressive disorder, without psychotic features ([place])   [place], Barbaraann Faster, NP   1 year ago Encounter for annual physical exam   [facility] [place], Barbaraann Faster, NP

## 2021-12-12 ENCOUNTER — Encounter: Payer: Self-pay | Admitting: Nurse Practitioner

## 2021-12-25 NOTE — Patient Instructions (Signed)

## 2021-12-30 ENCOUNTER — Ambulatory Visit (INDEPENDENT_AMBULATORY_CARE_PROVIDER_SITE_OTHER): Payer: BC Managed Care – PPO | Admitting: Nurse Practitioner

## 2021-12-30 ENCOUNTER — Encounter: Payer: Self-pay | Admitting: Nurse Practitioner

## 2021-12-30 VITALS — BP 113/75 | HR 70 | Temp 98.1°F | Wt 195.9 lb

## 2021-12-30 DIAGNOSIS — N1831 Chronic kidney disease, stage 3a: Secondary | ICD-10-CM | POA: Diagnosis not present

## 2021-12-30 DIAGNOSIS — Z789 Other specified health status: Secondary | ICD-10-CM

## 2021-12-30 DIAGNOSIS — E66811 Obesity, class 1: Secondary | ICD-10-CM

## 2021-12-30 DIAGNOSIS — Z0000 Encounter for general adult medical examination without abnormal findings: Secondary | ICD-10-CM

## 2021-12-30 DIAGNOSIS — F411 Generalized anxiety disorder: Secondary | ICD-10-CM

## 2021-12-30 DIAGNOSIS — E7800 Pure hypercholesterolemia, unspecified: Secondary | ICD-10-CM | POA: Diagnosis not present

## 2021-12-30 DIAGNOSIS — F332 Major depressive disorder, recurrent severe without psychotic features: Secondary | ICD-10-CM | POA: Diagnosis not present

## 2021-12-30 DIAGNOSIS — F1090 Alcohol use, unspecified, uncomplicated: Secondary | ICD-10-CM

## 2021-12-30 DIAGNOSIS — F5101 Primary insomnia: Secondary | ICD-10-CM

## 2021-12-30 DIAGNOSIS — Z6830 Body mass index (BMI) 30.0-30.9, adult: Secondary | ICD-10-CM

## 2021-12-30 DIAGNOSIS — I10 Essential (primary) hypertension: Secondary | ICD-10-CM | POA: Diagnosis not present

## 2021-12-30 DIAGNOSIS — R7989 Other specified abnormal findings of blood chemistry: Secondary | ICD-10-CM

## 2021-12-30 DIAGNOSIS — K760 Fatty (change of) liver, not elsewhere classified: Secondary | ICD-10-CM

## 2021-12-30 DIAGNOSIS — E559 Vitamin D deficiency, unspecified: Secondary | ICD-10-CM

## 2021-12-30 DIAGNOSIS — E6609 Other obesity due to excess calories: Secondary | ICD-10-CM

## 2021-12-30 LAB — MICROALBUMIN, URINE WAIVED
Creatinine, Urine Waived: 50 mg/dL (ref 10–300)
Microalb, Ur Waived: 10 mg/L (ref 0–19)
Microalb/Creat Ratio: <30 mg/g (ref <30)

## 2021-12-30 MED ORDER — VENLAFAXINE HCL ER 75 MG PO CP24
75.0000 mg | ORAL_CAPSULE | Freq: Every day | ORAL | 5 refills | Status: DC
Start: 2021-12-30 — End: 2022-12-01

## 2021-12-30 MED ORDER — HYDROCHLOROTHIAZIDE 25 MG PO TABS: 25.0000 mg | ORAL_TABLET | Freq: Every day | ORAL | 4 refills | Status: DC

## 2021-12-30 NOTE — Progress Notes (Signed)
BP 113/75   Pulse 70   Temp 98.1 F (36.7 C) (Oral)   Wt 195 lb 14.4 oz (88.9 kg)   SpO2 97%   BMI 29.79 kg/m    Subjective:    Patient ID: Alexandra Le, female    DOB: 24-Jul-1970, 51 y.o.   MRN: [id number]  HPI: Alexandra Le is a 51 y.o. female presenting on 12/30/2021 for comprehensive medical examination. Current medical complaints include:none  She currently lives with: husband and grand children Menopausal Symptoms: yes  HYPERTENSION Continues on Benazepril daily and HCTZ as needed (every 3-4 days).  Kidney function improved on recent labs.  Has reduced alcohol intake, rarely takes Naltrexone.   Hypertension status: stable  Satisfied with current treatment? yes Duration of hypertension: chronic BP monitoring frequency:  not checking BP range:  BP medication side effects:  no Medication compliance: good compliance Aspirin: no Recurrent headaches: no Visual changes: no Palpitations: no Dyspnea: no Chest pain: no Lower extremity edema: no Dizzy/lightheaded: no  The 10-year ASCVD risk score (Arnett DK, et al., 2019) is: 2%   Values used to calculate the score:     Age: 19 years     Sex: Female     Is Non-Hispanic African American: No     Diabetic: No     Tobacco smoker: No     Systolic Blood Pressure: 502 mmHg     Is BP treated: Yes     HDL Cholesterol: 60 mg/dL     Total Cholesterol: 260 mg/dL   DEPRESSION & INSOMNIA Continues on Effexor XR 75 MG daily and Trazodone 25-50 MG PRN. Mood status: stable Satisfied with current treatment?: yes Symptom severity: mild  Duration of current treatment : chronic Side effects: no Medication compliance: good compliance Psychotherapy/counseling: none Previous psychiatric medications: Effexor Depressed mood: no Anxious mood: no Anhedonia: no Significant weight loss or gain: no Insomnia: yes hard to stay asleep Fatigue:  occasional Feelings of worthlessness or guilt: no Impaired  concentration/indecisiveness: no Suicidal ideations: no Hopelessness: no Crying spells: no    12/30/2021    1:46 PM 09/30/2021   10:37 AM 04/06/2021    4:29 PM 03/07/2021    3:16 PM 06/24/2020    9:35 AM  Depression screen PHQ 2/9  Decreased Interest 0 1 0 0 0  Down, Depressed, Hopeless 0 1 0 3 0  PHQ - 2 Score 0 2 0 3 0  Altered sleeping '3 2 1 ' 0 0  Tired, decreased energy '1 2 1 ' 0 0  Change in appetite 0 2 0 0 0  Feeling bad or failure about yourself  0 1 0 0 0  Trouble concentrating '1 1 1 ' 0 0  Moving slowly or fidgety/restless 0 2 0 0 0  Suicidal thoughts 0 1 0 0 0  PHQ-9 Score '5 13 3 3 ' 0  Difficult doing work/chores Not difficult at all   Not difficult at all        12/30/2021    1:47 PM 09/30/2021   10:37 AM 04/06/2021    4:30 PM 01/17/2019    8:52 AM  GAD 7 : Generalized Anxiety Score  Nervous, Anxious, on Edge 0 1 0 1  Control/stop worrying 0 2 0 1  Worry too much - different things 0 2 0 1  Trouble relaxing 1 1 0 1  Restless 0 2 0 1  Easily annoyed or irritable '1 3 1 1  ' Afraid - awful might happen 0 1 0 0  Total  GAD 7 Score '2 12 1 6  ' Anxiety Difficulty Not difficult at all Somewhat difficult  Somewhat difficult        06/24/2020    9:35 AM 03/12/2021    6:02 PM 09/30/2021   10:37 AM 12/30/2021    1:46 PM 12/30/2021    2:02 PM  [place] in the past year? 0  0 0 0  Was there an injury with Fall?   0 0 0  Fall Risk Category Calculator   0 0 0  Fall Risk Category   Low Low Low  Patient Fall Risk Level  Low fall risk Low fall risk Low fall risk Low fall risk  Patient at Risk for Falls Due to   No Fall Risks No Fall Risks No Fall Risks  Fall risk Follow up   Falls evaluation completed Falls evaluation completed Falls prevention discussed    Functional Status Survey: Is the patient deaf or have difficulty hearing?: No Does the patient have difficulty seeing, even when wearing glasses/contacts?: No Does the patient have difficulty concentrating, remembering,  or making decisions?: No Does the patient have difficulty walking or climbing stairs?: No Does the patient have difficulty dressing or bathing?: No Does the patient have difficulty doing errands alone such as visiting a doctor's office or shopping?: No   Past Medical History:  Past Medical History:  Diagnosis Date   Anemia    history of anemai due to AUB   Anxiety    Chest pain of unknown etiology 2015   was seen in [place], [place], [place]- not dx'd and no follow up   Depression    Hypertension    PONV (postoperative nausea and vomiting)     Surgical History:  Past Surgical History:  Procedure Laterality Date   BIOPSY N/A 07/21/2016   Procedure: BIOPSY;  Surgeon: Lucilla Lame, MD;  Location: [facility];  Service: Endoscopy;  Laterality: N/A;  sigmoid polyps x2   CERVICAL CONIZATION W/BX N/A 04/30/2015   Procedure: CONIZATION CERVIX WITH BIOPSY;  Surgeon: Christophe Louis, MD;  Location: [facility];  Service: Gynecology;  Laterality: N/A;   COLONOSCOPY WITH PROPOFOL N/A 07/21/2016   Procedure: COLONOSCOPY WITH PROPOFOL;  Surgeon: Lucilla Lame, MD;  Location: [facility];  Service: Endoscopy;  Laterality: N/A;   pyloric stenosis     TUBAL LIGATION      Medications:  Current Outpatient Medications on File Prior to Visit  Medication Sig   benazepril (LOTENSIN) 40 MG tablet Take 1 tablet (40 mg total) by mouth daily.   Insulin Pen Needle 29G X 12.7MM MISC To use for Saxenda injection daily.   naltrexone (DEPADE) 50 MG tablet Started with 50 MG daily by mouth for one week and then if tolerating increase to 100 MG (2 tablets) by mouth daily.   SAXENDA 18 MG/3ML SOPN INJECT 0.6 MG INTO THE SKIN  DAILY FOR 7 DAYS THEN 1.2 MG  DAILY FOR 7 DAYS THEN 1.8 MG  DAILY FOR 7 DAYS THEN 2.4 MG  DAILY FOR 7 DAYS THEN 3 MG DAILY   traZODone (DESYREL) 50 MG tablet Take 0.5-1 tablets (25-50 mg total) by mouth at bedtime as needed for sleep.   No current facility-administered medications on file  prior to visit.    Allergies:  No Known Allergies  Social History:  Social History   Socioeconomic History   Marital status: Married    Spouse name: Not on file   Number of children: Not on file  Years of education: Not on file   Highest education level: Not on file  Occupational History   Not on file  Tobacco Use   Smoking status: Never   Smokeless tobacco: Never  Vaping Use   Vaping Use: Never used  Substance and Sexual Activity   Alcohol use: Yes    Alcohol/week: 1.0 standard drink of alcohol    Types: 1 Glasses of wine per week    Comment: occasional   Drug use: No   Sexual activity: Yes  Other Topics Concern   Not on file  Social History Narrative   Not on file   Social Determinants of Health   Financial Resource Strain: Not on file  Food Insecurity: Not on file  Transportation Needs: Not on file  Physical Activity: Not on file  Stress: Not on file  Social Connections: Not on file  Intimate Partner Violence: Not on file   Social History   Tobacco Use  Smoking Status Never  Smokeless Tobacco Never   Social History   Substance and Sexual Activity  Alcohol Use Yes   Alcohol/week: 1.0 standard drink of alcohol   Types: 1 Glasses of wine per week   Comment: occasional    Family History:  Family History  Adopted: Yes  Problem Relation Age of Onset   Breast cancer Neg Hx     Past medical history, surgical history, medications, allergies, family history and social history reviewed with patient today and changes made to appropriate areas of the chart.   ROS All other ROS negative except what is listed above and in the HPI.      Objective:    BP 113/75   Pulse 70   Temp 98.1 F (36.7 C) (Oral)   Wt 195 lb 14.4 oz (88.9 kg)   SpO2 97%   BMI 29.79 kg/m   Wt Readings from Last 3 Encounters:  12/30/21 195 lb 14.4 oz (88.9 kg)  09/30/21 206 lb (93.4 kg)  04/06/21 201 lb (91.2 kg)    Physical Exam Vitals and nursing note reviewed.   Constitutional:      General: She is awake. She is not in acute distress.    Appearance: She is well-developed and well-groomed. She is obese. She is not ill-appearing.  HENT:     Head: Normocephalic and atraumatic.     Right Ear: Hearing, tympanic membrane, ear canal and external ear normal. No drainage.     Left Ear: Hearing, tympanic membrane, ear canal and external ear normal. No drainage.     Nose: Nose normal.     Right Sinus: No maxillary sinus tenderness or frontal sinus tenderness.     Left Sinus: No maxillary sinus tenderness or frontal sinus tenderness.     Mouth/Throat:     Mouth: Mucous membranes are moist.     Pharynx: Oropharynx is clear. Uvula midline. No pharyngeal swelling, oropharyngeal exudate or posterior oropharyngeal erythema.  Eyes:     General: Lids are normal.        Right eye: No discharge.        Left eye: No discharge.     Extraocular Movements: Extraocular movements intact.     Conjunctiva/sclera: Conjunctivae normal.     Pupils: Pupils are equal, round, and reactive to light.     Visual Fields: Right eye visual fields normal and left eye visual fields normal.  Neck:     Thyroid: No thyromegaly.     Vascular: No carotid bruit.     Trachea:  Trachea normal.  Cardiovascular:     Rate and Rhythm: Normal rate and regular rhythm.     Heart sounds: Normal heart sounds. No murmur heard.    No gallop.  Pulmonary:     Effort: Pulmonary effort is normal. No accessory muscle usage or respiratory distress.     Breath sounds: Normal breath sounds.  Abdominal:     General: Bowel sounds are normal.     Palpations: Abdomen is soft. There is no hepatomegaly or splenomegaly.     Tenderness: There is no abdominal tenderness.  Musculoskeletal:        General: Normal range of motion.     Cervical back: Normal range of motion and neck supple.     Right lower leg: No edema.     Left lower leg: No edema.  Lymphadenopathy:     Head:     Right side of head: No  submental, submandibular, tonsillar, preauricular or posterior auricular adenopathy.     Left side of head: No submental, submandibular, tonsillar, preauricular or posterior auricular adenopathy.     Cervical: No cervical adenopathy.  Skin:    General: Skin is warm and dry.     Capillary Refill: Capillary refill takes less than 2 seconds.     Findings: No rash.  Neurological:     Mental Status: She is alert and oriented to person, place, and time.     Gait: Gait is intact.     Deep Tendon Reflexes: Reflexes are normal and symmetric.     Reflex Scores:      Brachioradialis reflexes are 2+ on the right side and 2+ on the left side.      Patellar reflexes are 2+ on the right side and 2+ on the left side. Psychiatric:        Attention and Perception: Attention normal.        Mood and Affect: Mood normal.        Speech: Speech normal.        Behavior: Behavior normal. Behavior is cooperative.        Thought Content: Thought content normal.        Judgment: Judgment normal.    Results for orders placed or performed in visit on 09/30/21  Comprehensive metabolic panel  Result Value Ref Range   Glucose 80 70 - 99 mg/dL   BUN 10 6 - 24 mg/dL   Creatinine, Ser 0.96 0.57 - 1.00 mg/dL   eGFR 71 >59 mL/min/1.73   BUN/Creatinine Ratio 10 9 - 23   Sodium 140 134 - 144 mmol/L   Potassium 4.2 3.5 - 5.2 mmol/L   Chloride 99 96 - 106 mmol/L   CO2 24 20 - 29 mmol/L   Calcium 9.7 8.7 - 10.2 mg/dL   Total Protein 7.4 6.0 - 8.5 g/dL   Albumin 4.7 3.8 - 4.9 g/dL   Globulin, Total 2.7 1.5 - 4.5 g/dL   Albumin/Globulin Ratio 1.7 1.2 - 2.2   Bilirubin Total 0.4 0.0 - 1.2 mg/dL   Alkaline Phosphatase 122 (H) 44 - 121 IU/L   AST 75 (H) 0 - 40 IU/L   ALT 88 (H) 0 - 32 IU/L  Lipid Panel w/o Chol/HDL Ratio  Result Value Ref Range   Cholesterol, Total 260 (H) 100 - 199 mg/dL   Triglycerides 248 (H) 0 - 149 mg/dL   HDL 60 >39 mg/dL   VLDL Cholesterol Cal 45 (H) 5 - 40 mg/dL   LDL Chol Calc (NIH) 155  (  H) 0 - 99 mg/dL  TSH  Result Value Ref Range   TSH 1.300 0.450 - 4.500 uIU/mL  VITAMIN D 25 Hydroxy (Vit-D Deficiency, Fractures)  Result Value Ref Range   Vit D, 25-Hydroxy 34.5 30.0 - 100.0 ng/mL  Gamma GT  Result Value Ref Range   GGT 102 (H) 0 - 60 IU/L  Microalbumin, Urine Waived  Result Value Ref Range   Microalb, Ur Waived 10 0 - 19 mg/L   Creatinine, Urine Waived 50 10 - 300 mg/dL   Microalb/Creat Ratio <30 <30 mg/g      Assessment & Plan:   Problem List Items Addressed This Visit       Cardiovascular and Mediastinum   Hypertension    Chronic, stable with BP at goal today.  Continue current medication regimen, HCTZ PRN and Benazepril daily, adjust as needed.  Could consider addition of BB if return of palpitations in future.  Recommend she monitor BP at home at least a few days a week and document.  DASH diet recommended.  LABS: CBC, CMP, urine ALB, TSH.  Return in 6 months.      Relevant Medications   hydrochlorothiazide (HYDRODIURIL) 25 MG tablet   Other Relevant Orders   CBC with Differential/Platelet   Comprehensive metabolic panel   TSH     Digestive   Hepatic steatosis    Ongoing.  Noted on imaging in January 2023 -- mild elevation LFTs recent check due to alcohol use at time.  Recommend complete cessation alcohol use and avoid Tylenol.  Recheck levels today.      Relevant Orders   Comprehensive metabolic panel   Gamma GT     Genitourinary   Chronic kidney disease, stage 3a ([place]) - Primary    Stable on recent labs.  Noted on past labs.  Continue Benazepril for kidney protection.  Recommend increased water intake at home and decrease Ibuprofen use.  Recheck CMP and urine ALB today.      Relevant Orders   Comprehensive metabolic panel   Microalbumin, Urine Waived     Other   RESOLVED: Alcohol use   Relevant Orders   Comprehensive metabolic panel   Depression    Chronic, improved at this time.  She denies SI/HI today.  Will continue current  medication regimen and adjust as needed, continue Naltrexone PRN to help reduce alcohol use when heavier use present.  Recommend therapy in future.  Return in 6 months.      Relevant Medications   venlafaxine XR (EFFEXOR-XR) 75 MG 24 hr capsule   Elevated LFTs    Stable, has reduced alcohol use at this time. CMP and GGT today.  Recommend alcohol use cessation.      Relevant Orders   Comprehensive metabolic panel   Gamma GT   Elevated low density lipoprotein (LDL) cholesterol level    Ongoing on labs.  At this time LDL <190 and ASCVD 2%.  Continue diet and exercise focus -- lipid panel today.       Relevant Orders   Comprehensive metabolic panel   Lipid Panel w/o Chol/HDL Ratio   Generalized anxiety disorder    Refer to depression plan of care.      Relevant Medications   venlafaxine XR (EFFEXOR-XR) 75 MG 24 hr capsule   Insomnia    Chronic, ongoing.  Continue Trazodone as offers benefit to sleep, recommend she take this nightly for full benefit.  Recommend focus on healthy sleep hygiene.  Obesity    BMI 29.79 -- has been losing weight with Saxenda.  Maintain this regimen.  Recommended eating smaller high protein, low fat meals more frequently and exercising 30 mins a day 5 times a week with a goal of 10-15lb weight loss in the next 3 months. Patient voiced their understanding and motivation to adhere to these recommendations.       Vitamin D deficiency    Ongoing.  Noted on past labs, recheck today and adjust supplement as needed.      Relevant Orders   VITAMIN D 25 Hydroxy (Vit-D Deficiency, Fractures)   Other Visit Diagnoses     Encounter for annual physical exam            Follow up plan: Return in about 6 months (around 07/02/2022) for HTN/HLD, MOOD, INSOMNIA.   LABORATORY TESTING:  - Pap smear: up to date  IMMUNIZATIONS:   - Tdap: Tetanus vaccination status reviewed: Up To Date - Influenza: Refused - Pneumovax: Not applicable - Prevnar: Not  applicable - HPV: Not applicable - Zostavax vaccine: Refuses today  SCREENING: -Mammogram: Up to date  - Colonoscopy:  Wishes to wait for referral - Bone Density: Not applicable  -Hearing Test: Not applicable  -Spirometry: Not applicable   PATIENT COUNSELING:   Advised to take 1 mg of folate supplement per day if capable of pregnancy.   Sexuality: Discussed sexually transmitted diseases, partner selection, use of condoms, avoidance of unintended pregnancy  and contraceptive alternatives.   Advised to avoid cigarette smoking.  I discussed with the patient that most people either abstain from alcohol or drink within safe limits (<=14/week and <=4 drinks/occasion for males, <=7/weeks and <= 3 drinks/occasion for females) and that the risk for alcohol disorders and other health effects rises proportionally with the number of drinks per week and how often a drinker exceeds daily limits.  Discussed cessation/primary prevention of drug use and availability of treatment for abuse.   Diet: Encouraged to adjust caloric intake to maintain  or achieve ideal body weight, to reduce intake of dietary saturated fat and total fat, to limit sodium intake by avoiding high sodium foods and not adding table salt, and to maintain adequate dietary potassium and calcium preferably from fresh fruits, vegetables, and low-fat dairy products.    stressed the importance of regular exercise  Injury prevention: Discussed safety belts, safety helmets, smoke detector, smoking near bedding or upholstery.   Dental health: Discussed importance of regular tooth brushing, flossing, and dental visits.    NEXT PREVENTATIVE PHYSICAL DUE IN 1 YEAR. Return in about 6 months (around 07/02/2022) for HTN/HLD, MOOD, INSOMNIA.

## 2021-12-30 NOTE — Assessment & Plan Note (Signed)
Stable on recent labs.  Noted on past labs.  Continue Benazepril for kidney protection.  Recommend increased water intake at home and decrease Ibuprofen use.  Recheck CMP and urine ALB today.

## 2021-12-30 NOTE — Assessment & Plan Note (Signed)
Refer to depression plan of care. 

## 2021-12-30 NOTE — Assessment & Plan Note (Signed)
Chronic, improved at this time.  She denies SI/HI today.  Will continue current medication regimen and adjust as needed, continue Naltrexone PRN to help reduce alcohol use when heavier use present.  Recommend therapy in future.  Return in 6 months.

## 2021-12-30 NOTE — Assessment & Plan Note (Signed)
Ongoing.  Noted on past labs, recheck today and adjust supplement as needed.

## 2021-12-30 NOTE — Assessment & Plan Note (Signed)
Stable, has reduced alcohol use at this time. CMP and GGT today.  Recommend alcohol use cessation.

## 2021-12-30 NOTE — Assessment & Plan Note (Signed)
Chronic, stable with BP at goal today.  Continue current medication regimen, HCTZ PRN and Benazepril daily, adjust as needed.  Could consider addition of BB if return of palpitations in future.  Recommend she monitor BP at home at least a few days a week and document.  DASH diet recommended.  LABS: CBC, CMP, urine ALB, TSH.  Return in 6 months.

## 2021-12-30 NOTE — Assessment & Plan Note (Signed)
Ongoing on labs.  At this time LDL <190 and ASCVD 2%.  Continue diet and exercise focus -- lipid panel today.

## 2021-12-30 NOTE — Assessment & Plan Note (Signed)
Ongoing.  Noted on imaging in January 2023 -- mild elevation LFTs recent check due to alcohol use at time.  Recommend complete cessation alcohol use and avoid Tylenol.  Recheck levels today.

## 2021-12-30 NOTE — Assessment & Plan Note (Signed)
Chronic, ongoing.  Continue Trazodone as offers benefit to sleep, recommend she take this nightly for full benefit.  Recommend focus on healthy sleep hygiene.

## 2021-12-30 NOTE — Assessment & Plan Note (Signed)
BMI 29.79 -- has been losing weight with Saxenda.  Maintain this regimen.  Recommended eating smaller high protein, low fat meals more frequently and exercising 30 mins a day 5 times a week with a goal of 10-15lb weight loss in the next 3 months. Patient voiced their understanding and motivation to adhere to these recommendations.

## 2021-12-31 LAB — CBC WITH DIFFERENTIAL/PLATELET
Basophils Absolute: 0 10*3/uL (ref 0.0–0.2)
Basos: 1 %
EOS (ABSOLUTE): 0.3 10*3/uL (ref 0.0–0.4)
Eos: 5 %
Hematocrit: 38 % (ref 34.0–46.6)
Hemoglobin: 12.7 g/dL (ref 11.1–15.9)
Immature Grans (Abs): 0 10*3/uL (ref 0.0–0.1)
Immature Granulocytes: 0 %
Lymphocytes Absolute: 1.9 10*3/uL (ref 0.7–3.1)
Lymphs: 25 %
MCH: 30.4 pg (ref 26.6–33.0)
MCHC: 33.4 g/dL (ref 31.5–35.7)
MCV: 91 fL (ref 79–97)
Monocytes Absolute: 0.5 10*3/uL (ref 0.1–0.9)
Monocytes: 7 %
Neutrophils Absolute: 4.7 10*3/uL (ref 1.4–7.0)
Neutrophils: 62 %
Platelets: 267 10*3/uL (ref 150–450)
RBC: 4.18 x10E6/uL (ref 3.77–5.28)
RDW: 12.9 % (ref 11.7–15.4)
WBC: 7.5 10*3/uL (ref 3.4–10.8)

## 2021-12-31 LAB — COMPREHENSIVE METABOLIC PANEL
ALT: 39 IU/L — ABNORMAL HIGH (ref 0–32)
AST: 25 IU/L (ref 0–40)
Albumin/Globulin Ratio: 1.7 (ref 1.2–2.2)
Albumin: 4.3 g/dL (ref 3.8–4.9)
Alkaline Phosphatase: 94 IU/L (ref 44–121)
BUN/Creatinine Ratio: 11 (ref 9–23)
BUN: 9 mg/dL (ref 6–24)
Bilirubin Total: 0.5 mg/dL (ref 0.0–1.2)
CO2: 23 mmol/L (ref 20–29)
Calcium: 9.4 mg/dL (ref 8.7–10.2)
Chloride: 102 mmol/L (ref 96–106)
Creatinine, Ser: 0.81 mg/dL (ref 0.57–1.00)
Globulin, Total: 2.5 g/dL (ref 1.5–4.5)
Glucose: 74 mg/dL (ref 70–99)
Potassium: 3.9 mmol/L (ref 3.5–5.2)
Sodium: 139 mmol/L (ref 134–144)
Total Protein: 6.8 g/dL (ref 6.0–8.5)
eGFR: 87 mL/min/{1.73_m2} (ref >59)

## 2021-12-31 LAB — LIPID PANEL W/O CHOL/HDL RATIO
Cholesterol, Total: 195 mg/dL (ref 100–199)
HDL: 61 mg/dL (ref >39)
LDL Chol Calc (NIH): 111 mg/dL — ABNORMAL HIGH (ref 0–99)
Triglycerides: 132 mg/dL (ref 0–149)
VLDL Cholesterol Cal: 23 mg/dL (ref 5–40)

## 2021-12-31 LAB — GAMMA GT: GGT: 35 IU/L (ref 0–60)

## 2021-12-31 LAB — VITAMIN D 25 HYDROXY (VIT D DEFICIENCY, FRACTURES): Vit D, 25-Hydroxy: 35.9 ng/mL (ref 30.0–100.0)

## 2021-12-31 LAB — TSH: TSH: 1.18 u[IU]/mL (ref 0.450–4.500)

## 2021-12-31 NOTE — Progress Notes (Signed)
Contacted via [facility] The 10-year ASCVD risk score (Arnett DK, et al., 2019) is: 1.3%   Values used to calculate the score:     Age: 51 years     Sex: Female     Is Non-Hispanic African American: No     Diabetic: No     Tobacco smoker: No     Systolic Blood Pressure: 751 mmHg     Is BP treated: Yes     HDL Cholesterol: 61 mg/dL     Total Cholesterol: 195 mg/dL   Good morning Alexandra Le, your labs have returned: - Kidney function, creatinine and eGFR, remains normal.  Liver function testing is improving -- only mild elevation in ALT.  Continue to work on alcohol cut back, this is working well. - Cholesterol continues to show elevation in LDL, but recommend continued focus on diet changes and regular exercise.  No medication needed at this time. - Remainder of labs are all stable.  Great news!!! Any questions? Keep being amazing!!  Thank you for allowing me to participate in your care.  I appreciate you. Kindest regards, Keilani Terrance

## 2022-02-24 ENCOUNTER — Encounter: Payer: Self-pay | Admitting: Nurse Practitioner

## 2022-02-28 ENCOUNTER — Other Ambulatory Visit: Payer: Self-pay

## 2022-02-28 MED ORDER — SAXENDA 18 MG/3ML ~~LOC~~ SOPN
PEN_INJECTOR | SUBCUTANEOUS | 3 refills | Status: DC
Start: 2022-02-28 — End: 2022-03-06

## 2022-03-06 ENCOUNTER — Encounter: Payer: Self-pay | Admitting: Nurse Practitioner

## 2022-03-06 MED ORDER — WEGOVY 1.7 MG/0.75ML ~~LOC~~ SOAJ
1.7000 mg | SUBCUTANEOUS | 4 refills | Status: DC
Start: 2022-03-06 — End: 2022-03-08

## 2022-03-08 MED ORDER — WEGOVY 1.7 MG/0.75ML ~~LOC~~ SOAJ
1.7000 mg | SUBCUTANEOUS | 4 refills | Status: DC
Start: 2022-03-08 — End: 2022-03-24

## 2022-03-08 NOTE — Addendum Note (Signed)
Addended by: Marnee Guarneri T on: 03/08/2022 09:14 AM   Modules accepted: Orders

## 2022-03-24 ENCOUNTER — Other Ambulatory Visit: Payer: Self-pay | Admitting: Nurse Practitioner

## 2022-03-24 MED ORDER — WEGOVY 1.7 MG/0.75ML ~~LOC~~ SOAJ: 1.7000 mg | SUBCUTANEOUS | 4 refills | Status: DC

## 2022-03-24 NOTE — Telephone Encounter (Signed)
Requested Prescriptions  Pending Prescriptions Disp Refills   Semaglutide-Weight Management (WEGOVY) 1.7 MG/0.75ML SOAJ 3 mL 4    Sig: Inject 1.7 mg into the skin once a week.     Endocrinology:  Diabetes - GLP-1 Receptor Agonists - semaglutide Failed - 03/24/2022  3:43 PM      Failed - HBA1C in normal range and within 180 days    No results found for: "HGBA1C", "LABA1C"       Passed - Cr in normal range and within 360 days    Creatinine, Ser  Date Value Ref Range Status  12/30/2021 0.81 0.57 - 1.00 mg/dL Final         Passed - Valid encounter within last 6 months    Recent Outpatient Visits           2 months ago Chronic kidney disease, stage 3a ([place])   [place], Jolene T, NP   5 months ago Severe episode of recurrent major depressive disorder, without psychotic features ([place])   [place] [facility], Jolene T, NP   11 months ago Cellulitis of left lower extremity   [place], Jolene T, NP   11 months ago Cellulitis of left lower extremity   [place], Jolene T, NP   1 year ago Severe episode of recurrent major depressive disorder, without psychotic features ([place])   [place], Barbaraann Faster, NP

## 2022-03-24 NOTE — Telephone Encounter (Addendum)
Pt states she can get $200 cheaper the Semaglutide-Weight Management (WEGOVY) 1.7 MG/0.75ML SOAJ  At [facility].  Pt states they have this in stock now, and pt hopes you can get it to them asap. Can you send it them and cancel the mail order?  [id number] send mEstée Lauderwhen done.

## 2022-04-11 ENCOUNTER — Encounter: Payer: Self-pay | Admitting: Nurse Practitioner

## 2022-04-11 ENCOUNTER — Ambulatory Visit
Admission: RE | Admit: 2022-04-11 | Discharge: 2022-04-11 | Disposition: A | Discharge status: Home / Self Care | Payer: BC Managed Care – PPO | Source: Ambulatory Visit | Attending: Nurse Practitioner | Admitting: Nurse Practitioner

## 2022-04-11 ENCOUNTER — Telehealth: Payer: Self-pay

## 2022-04-11 ENCOUNTER — Ambulatory Visit (INDEPENDENT_AMBULATORY_CARE_PROVIDER_SITE_OTHER): Payer: BC Managed Care – PPO | Admitting: Nurse Practitioner

## 2022-04-11 VITALS — BP 135/87 | HR 65 | Temp 97.7°F | Ht 67.99 in | Wt 185.7 lb

## 2022-04-11 DIAGNOSIS — R1031 Right lower quadrant pain: Secondary | ICD-10-CM | POA: Insufficient documentation

## 2022-04-11 DIAGNOSIS — N760 Acute vaginitis: Secondary | ICD-10-CM

## 2022-04-11 DIAGNOSIS — B9689 Other specified bacterial agents as the cause of diseases classified elsewhere: Secondary | ICD-10-CM | POA: Insufficient documentation

## 2022-04-11 LAB — URINALYSIS, ROUTINE W REFLEX MICROSCOPIC
Bilirubin, UA: NEGATIVE
Glucose, UA: NEGATIVE
Ketones, UA: NEGATIVE
Leukocytes,UA: NEGATIVE
Nitrite, UA: NEGATIVE
Protein,UA: NEGATIVE
RBC, UA: NEGATIVE
Specific Gravity, UA: 1.02 (ref 1.005–1.030)
Urobilinogen, Ur: 2 mg/dL — ABNORMAL HIGH (ref 0.2–1.0)
pH, UA: 6 (ref 5.0–7.5)

## 2022-04-11 LAB — WET PREP FOR TRICH, YEAST, CLUE
Clue Cell Exam: POSITIVE — AB
Trichomonas Exam: NEGATIVE
Yeast Exam: NEGATIVE

## 2022-04-11 MED ORDER — METRONIDAZOLE 500 MG PO TABS: 500.0000 mg | ORAL_TABLET | Freq: Two times a day (BID) | ORAL | 0 refills | Status: AC

## 2022-04-11 NOTE — Patient Instructions (Signed)
Abdominal Pain, Adult Many things can cause belly (abdominal) pain. Most times, belly pain is not dangerous. Many cases of belly pain can be watched and treated at home. Sometimes, though, belly pain is serious. Your doctor will try to find the cause of your belly pain. Follow these instructions at home:  Medicines Take over-the-counter and prescription medicines only as told by your doctor. Do not take medicines that help you poop (laxatives) unless told by your doctor. General instructions Watch your belly pain for any changes. Drink enough fluid to keep your pee (urine) pale yellow. Keep all follow-up visits as told by your doctor. This is important. Contact a doctor if: Your belly pain changes or gets worse. You are not hungry, or you lose weight without trying. You are having trouble pooping (constipated) or have watery poop (diarrhea) for more than 2-3 days. You have pain when you pee or poop. Your belly pain wakes you up at night. Your pain gets worse with meals, after eating, or with certain foods. You are vomiting and cannot keep anything down. You have a fever. You have blood in your pee. Get help right away if: Your pain does not go away as soon as your doctor says it should. You cannot stop vomiting. Your pain is only in areas of your belly, such as the right side or the left lower part of the belly. You have bloody or black poop, or poop that looks like tar. You have very bad pain, cramping, or bloating in your belly. You have signs of not having enough fluid or water in your body (dehydration), such as: Dark pee, very little pee, or no pee. Cracked lips. Dry mouth. Sunken eyes. Sleepiness. Weakness. You have trouble breathing or chest pain. Summary Many cases of belly pain can be watched and treated at home. Watch your belly pain for any changes. Take over-the-counter and prescription medicines only as told by your doctor. Contact a doctor if your belly pain  changes or gets worse. Get help right away if you have very bad pain, cramping, or bloating in your belly. This information is not intended to replace advice given to you by your health care provider. Make sure you discuss any questions you have with your health care provider. Document Revised: 09/02/2018 Document Reviewed: 09/02/2018 Elsevier Patient Education  2023 Elsevier Inc.  

## 2022-04-11 NOTE — Telephone Encounter (Signed)
Patient called and notified of STAT CT of RLQ was scheduled for 4pm today at the [facility] imaging center. Patient verbalized understanding.

## 2022-04-11 NOTE — Assessment & Plan Note (Signed)
Wet prep + clue cells.  Start Flagyl BID for 7 days. Educated on this and treatment regimen.

## 2022-04-11 NOTE — Assessment & Plan Note (Signed)
Acute for 4 days with ongoing pain.  Recommend continue Tylenol as needed for now.  She is tender to touch on exam.  UA negative and wet prep + clue cells, refer to BV plan of care.  Concern for appendix.  Will order STAT CT abd/pelvis to further assess.  Labs today CBC, CMP, GGT.  If concerns on CT noted then will further determine plan of care.  Discussed with patient.  Return in one week, sooner if worsening.

## 2022-04-11 NOTE — Progress Notes (Signed)
Contacted via MyChart   Good afternoon Alexandra Le, overall CT scan shows no acute findings.  Good news.  There are some uterine fibroids, which are benign findings.  Pain may be more from current infection then.  Take antibiotic and if worsening let me know.  Follow-up next week:) Keep being amazing!!  Thank you for allowing me to participate in your care.  I appreciate you. Kindest regards, Minard Millirons

## 2022-04-11 NOTE — Progress Notes (Signed)
BP 135/87   Pulse 65   Temp 97.7 F (36.5 C) (Oral)   Ht 5' 7.99" (1.727 m)   Wt 185 lb 11.2 oz (84.2 kg)   SpO2 99%   BMI 28.24 kg/m    Subjective:    Patient ID: Alexandra Le, female    DOB: 03/19/1971, 51 y.o.   MRN: [id number]  HPI: Alexandra Le is a 51 y.o. female  Chief Complaint  Patient presents with   Flank Pain    Right lower side pain and radiates from the front to back of abd, touching skin is painful, Started on Friday or Saturday. Seems to be getting worse. Patient has not seen a rash   ABDOMINAL PAIN  Started with pain on Friday or Saturday morning with sensitivity to touch RLQ and then became more intense in pain, yesterday being worse.  Still has appendix in place.  Has not had any other symptoms with pain.  Starts out as dull pain and then gets sharp and stabbing -- pain is constant, but can still function.  Still has uterus and ovaries intact.  No current alcohol use or smoking.   Denies history of kidney stones.   Duration:days Onset: sudden Severity: 6/10 -- took Tylenol and this helped some, at worst 7-8/10 Quality: sharp, dull, aching, stabbing, and throbbing Location:  RLQ  Episode duration:  Radiation: yes around to right lower back Frequency: constant Alleviating factors: Tylenol Aggravating factors: unknown Status: fluctuating Treatments attempted: Tylenol Fever: no Nausea: no Vomiting: no Weight loss: no Decreased appetite: no Diarrhea: no Constipation: no Blood in stool: no Heartburn: no Jaundice: no Rash: no Dysuria/urinary frequency: no Hematuria: no History of sexually transmitted disease: no Recurrent NSAID use: no   Relevant past medical, surgical, family and social history reviewed and updated as indicated. Interim medical history since our last visit reviewed. Allergies and medications reviewed and updated.  Review of Systems  Constitutional:  Negative for activity change, appetite change, diaphoresis,  fatigue and fever.  Respiratory:  Negative for cough, chest tightness and shortness of breath.   Cardiovascular:  Negative for chest pain, palpitations and leg swelling.  Gastrointestinal:  Positive for abdominal pain. Negative for abdominal distention, blood in stool, constipation, diarrhea, rectal pain and vomiting.  Neurological: Negative.   Psychiatric/Behavioral:  Negative for decreased concentration, self-injury, sleep disturbance and suicidal ideas. The patient is not nervous/anxious.    Per HPI unless specifically indicated above     Objective:    BP 135/87   Pulse 65   Temp 97.7 F (36.5 C) (Oral)   Ht 5' 7.99" (1.727 m)   Wt 185 lb 11.2 oz (84.2 kg)   SpO2 99%   BMI 28.24 kg/m   Wt Readings from Last 3 Encounters:  04/11/22 185 lb 11.2 oz (84.2 kg)  12/30/21 195 lb 14.4 oz (88.9 kg)  09/30/21 206 lb (93.4 kg)    Physical Exam Vitals and nursing note reviewed.  Constitutional:      General: She is awake. She is not in acute distress.    Appearance: She is well-developed and well-groomed. She is not ill-appearing or toxic-appearing.  HENT:     Head: Normocephalic.     Right Ear: Hearing normal.     Left Ear: Hearing normal.  Eyes:     General: Lids are normal.        Right eye: No discharge.        Left eye: No discharge.     Conjunctiva/sclera: Conjunctivae  normal.     Pupils: Pupils are equal, round, and reactive to light.  Neck:     Thyroid: No thyromegaly.     Vascular: No carotid bruit.  Cardiovascular:     Rate and Rhythm: Normal rate and regular rhythm.     Heart sounds: Normal heart sounds. No murmur heard.    No gallop.  Pulmonary:     Effort: Pulmonary effort is normal. No accessory muscle usage or respiratory distress.     Breath sounds: Normal breath sounds.  Abdominal:     General: Bowel sounds are normal.     Palpations: Abdomen is soft. There is no hepatomegaly or splenomegaly.     Tenderness: There is abdominal tenderness in the right  lower quadrant. There is right CVA tenderness. There is no left CVA tenderness, guarding or rebound. Negative signs include psoas sign and obturator sign.     Hernia: No hernia is present.  Musculoskeletal:     Cervical back: Normal range of motion and neck supple.     Right lower leg: No edema.     Left lower leg: No edema.  Skin:    General: Skin is warm and dry.  Neurological:     Mental Status: She is alert and oriented to person, place, and time.  Psychiatric:        Attention and Perception: Attention normal.        Mood and Affect: Mood normal.        Behavior: Behavior normal. Behavior is cooperative.        Thought Content: Thought content normal.        Judgment: Judgment normal.    Results for orders placed or performed in visit on 04/11/22  WET PREP FOR [place], YEAST, CLUE   Specimen: Sterile Swab   Sterile Swab  Result Value Ref Range   Trichomonas Exam Negative Negative   Yeast Exam Negative Negative   Clue Cell Exam Positive (A) Negative  Urinalysis, Routine w reflex microscopic  Result Value Ref Range   Specific Gravity, UA 1.020 1.005 - 1.030   pH, UA 6.0 5.0 - 7.5   Color, UA Yellow Yellow   Appearance Ur Clear Clear   Leukocytes,UA Negative Negative   Protein,UA Negative Negative/Trace   Glucose, UA Negative Negative   Ketones, UA Negative Negative   RBC, UA Negative Negative   Bilirubin, UA Negative Negative   Urobilinogen, Ur 2.0 (H) 0.2 - 1.0 mg/dL   Nitrite, UA Negative Negative   Microscopic Examination Comment       Assessment & Plan:   Problem List Items Addressed This Visit       Genitourinary   Bacterial vaginosis    Wet prep + clue cells.  Start Flagyl BID for 7 days. Educated on this and treatment regimen.      Relevant Medications   metroNIDAZOLE (FLAGYL) 500 MG tablet     Other   RLQ abdominal pain - Primary    Acute for 4 days with ongoing pain.  Recommend continue Tylenol as needed for now.  She is tender to touch on exam.  UA  negative and wet prep + clue cells, refer to BV plan of care.  Concern for appendix.  Will order STAT CT abd/pelvis to further assess.  Labs today CBC, CMP, GGT.  If concerns on CT noted then will further determine plan of care.  Discussed with patient.  Return in one week, sooner if worsening.      Relevant Orders  CBC with Differential/Platelet   Comprehensive metabolic panel   Gamma GT   Urinalysis, Routine w reflex microscopic (Completed)   WET PREP FOR TRICH, YEAST, CLUE (Completed)   CT Abdomen Pelvis Wo Contrast     Follow up plan: Return in about 1 year (around 04/12/2023) for RLQ PAIN .

## 2022-04-12 LAB — CBC WITH DIFFERENTIAL/PLATELET
Basophils Absolute: 0 10*3/uL (ref 0.0–0.2)
Basos: 1 %
EOS (ABSOLUTE): 0.2 10*3/uL (ref 0.0–0.4)
Eos: 3 %
Hematocrit: 40.3 % (ref 34.0–46.6)
Hemoglobin: 13.5 g/dL (ref 11.1–15.9)
Immature Grans (Abs): 0 10*3/uL (ref 0.0–0.1)
Immature Granulocytes: 0 %
Lymphocytes Absolute: 2 10*3/uL (ref 0.7–3.1)
Lymphs: 29 %
MCH: 30 pg (ref 26.6–33.0)
MCHC: 33.5 g/dL (ref 31.5–35.7)
MCV: 90 fL (ref 79–97)
Monocytes Absolute: 0.4 10*3/uL (ref 0.1–0.9)
Monocytes: 6 %
Neutrophils Absolute: 4.1 10*3/uL (ref 1.4–7.0)
Neutrophils: 61 %
Platelets: 300 10*3/uL (ref 150–450)
RBC: 4.5 x10E6/uL (ref 3.77–5.28)
RDW: 12.7 % (ref 11.7–15.4)
WBC: 6.8 10*3/uL (ref 3.4–10.8)

## 2022-04-12 LAB — COMPREHENSIVE METABOLIC PANEL
ALT: 13 IU/L (ref 0–32)
AST: 14 IU/L (ref 0–40)
Albumin/Globulin Ratio: 2.2 (ref 1.2–2.2)
Albumin: 4.8 g/dL (ref 3.8–4.9)
Alkaline Phosphatase: 103 IU/L (ref 44–121)
BUN/Creatinine Ratio: 16 (ref 9–23)
BUN: 13 mg/dL (ref 6–24)
Bilirubin Total: 0.7 mg/dL (ref 0.0–1.2)
CO2: 24 mmol/L (ref 20–29)
Calcium: 9.7 mg/dL (ref 8.7–10.2)
Chloride: 106 mmol/L (ref 96–106)
Creatinine, Ser: 0.81 mg/dL (ref 0.57–1.00)
Globulin, Total: 2.2 g/dL (ref 1.5–4.5)
Glucose: 86 mg/dL (ref 70–99)
Potassium: 5 mmol/L (ref 3.5–5.2)
Sodium: 143 mmol/L (ref 134–144)
Total Protein: 7 g/dL (ref 6.0–8.5)
eGFR: 87 mL/min/{1.73_m2} (ref >59)

## 2022-04-12 LAB — GAMMA GT: GGT: 15 IU/L (ref 0–60)

## 2022-04-12 NOTE — Progress Notes (Signed)
Contacted via MyChart   Good evening Alexandra Le, all blood work has returned nice and normal.  Good news!!

## 2022-04-13 ENCOUNTER — Encounter: Payer: Self-pay | Admitting: Nurse Practitioner

## 2022-04-15 ENCOUNTER — Emergency Department
Admission: EM | Admit: 2022-04-15 | Discharge: 2022-04-15 | Disposition: A | Discharge status: Home / Self Care | Payer: BC Managed Care – PPO | Attending: Emergency Medicine | Admitting: Emergency Medicine

## 2022-04-15 ENCOUNTER — Emergency Department: Payer: BC Managed Care – PPO

## 2022-04-15 ENCOUNTER — Other Ambulatory Visit: Payer: Self-pay

## 2022-04-15 DIAGNOSIS — R109 Unspecified abdominal pain: Secondary | ICD-10-CM | POA: Diagnosis present

## 2022-04-15 DIAGNOSIS — I129 Hypertensive chronic kidney disease with stage 1 through stage 4 chronic kidney disease, or unspecified chronic kidney disease: Secondary | ICD-10-CM | POA: Insufficient documentation

## 2022-04-15 DIAGNOSIS — N189 Chronic kidney disease, unspecified: Secondary | ICD-10-CM | POA: Diagnosis not present

## 2022-04-15 DIAGNOSIS — N12 Tubulo-interstitial nephritis, not specified as acute or chronic: Secondary | ICD-10-CM | POA: Diagnosis not present

## 2022-04-15 LAB — URINALYSIS, ROUTINE W REFLEX MICROSCOPIC
Bilirubin Urine: NEGATIVE
Glucose, UA: NEGATIVE mg/dL
Hgb urine dipstick: NEGATIVE
Ketones, ur: NEGATIVE mg/dL
Nitrite: NEGATIVE
Protein, ur: 30 mg/dL — AB
Specific Gravity, Urine: 1.019 (ref 1.005–1.030)
pH: 5 (ref 5.0–8.0)

## 2022-04-15 LAB — POC URINE PREG, ED: Preg Test, Ur: NEGATIVE

## 2022-04-15 LAB — CBC WITH DIFFERENTIAL/PLATELET
Abs Immature Granulocytes: 0.03 10*3/uL (ref 0.00–0.07)
Basophils Absolute: 0.1 10*3/uL (ref 0.0–0.1)
Basophils Relative: 1 %
Eosinophils Absolute: 0.3 10*3/uL (ref 0.0–0.5)
Eosinophils Relative: 4 %
HCT: 43.9 % (ref 36.0–46.0)
Hemoglobin: 14.5 g/dL (ref 12.0–15.0)
Immature Granulocytes: 0 %
Lymphocytes Relative: 24 %
Lymphs Abs: 1.6 10*3/uL (ref 0.7–4.0)
MCH: 29.4 pg (ref 26.0–34.0)
MCHC: 33 g/dL (ref 30.0–36.0)
MCV: 89 fL (ref 80.0–100.0)
Monocytes Absolute: 0.6 10*3/uL (ref 0.1–1.0)
Monocytes Relative: 8 %
Neutro Abs: 4.3 10*3/uL (ref 1.7–7.7)
Neutrophils Relative %: 63 %
Platelets: 319 10*3/uL (ref 150–400)
RBC: 4.93 MIL/uL (ref 3.87–5.11)
RDW: 13.5 % (ref 11.5–15.5)
WBC: 6.8 10*3/uL (ref 4.0–10.5)
nRBC: 0 % (ref 0.0–0.2)

## 2022-04-15 LAB — COMPREHENSIVE METABOLIC PANEL
ALT: 18 U/L (ref 0–44)
AST: 23 U/L (ref 15–41)
Albumin: 4.6 g/dL (ref 3.5–5.0)
Alkaline Phosphatase: 75 U/L (ref 38–126)
Anion gap: 7 (ref 5–15)
BUN: 17 mg/dL (ref 6–20)
CO2: 25 mmol/L (ref 22–32)
Calcium: 9.6 mg/dL (ref 8.9–10.3)
Chloride: 105 mmol/L (ref 98–111)
Creatinine, Ser: 1.21 mg/dL — ABNORMAL HIGH (ref 0.44–1.00)
GFR, Estimated: 54 mL/min — ABNORMAL LOW (ref >60)
Glucose, Bld: 86 mg/dL (ref 70–99)
Potassium: 3.9 mmol/L (ref 3.5–5.1)
Sodium: 137 mmol/L (ref 135–145)
Total Bilirubin: 0.8 mg/dL (ref 0.3–1.2)
Total Protein: 8.1 g/dL (ref 6.5–8.1)

## 2022-04-15 LAB — LIPASE, BLOOD: Lipase: 42 U/L (ref 11–51)

## 2022-04-15 MED ORDER — SODIUM CHLORIDE 0.9 % IV SOLN: 1.0000 g | Freq: Once | INTRAVENOUS | Status: DC

## 2022-04-15 MED ORDER — CEFDINIR 300 MG PO CAPS: 300.0000 mg | ORAL_CAPSULE | Freq: Two times a day (BID) | ORAL | 0 refills | Status: AC

## 2022-04-15 MED ORDER — LIDOCAINE HCL (PF) 1 % IJ SOLN
INTRAMUSCULAR | Status: AC
  Administered 2022-04-15: 2.1 mL
  Filled 2022-04-15: qty 5

## 2022-04-15 MED ORDER — CEFTRIAXONE SODIUM 1 G IJ SOLR
1.0000 g | Freq: Once | INTRAMUSCULAR | Status: AC
  Administered 2022-04-15: 1 g via INTRAMUSCULAR
  Filled 2022-04-15: qty 10

## 2022-04-15 NOTE — ED Notes (Signed)
Patient Alert and oriented to baseline. Stable and ambulatory to baseline. Patient verbalized understanding of the discharge instructions.  Patient belongings were taken by the patient.   

## 2022-04-15 NOTE — ED Triage Notes (Signed)
Pt states she has pain in her RLQ going into her flank- pt states that she went to her Dr last Friday and had a CT scan done and it was fine- pt states she is concerned for an ovarian cyst- no n/v/d- no urinary symptoms or blood in urine

## 2022-04-15 NOTE — ED Provider Notes (Signed)
[facility] Provider Note    Event Date/Time   First MD Initiated Contact with Patient 04/15/22 1647     (approximate)   History   Abdominal Pain   HPI  Alexandra Le is a 51 y.o. female with a history of hypertension, anemia, anxiety, elevated LDL, and CKD who presents with right lower quadrant pain for the last week that then migrated to the right flank.  It is constant.  It has not been associated with nausea, vomiting, or diarrhea.  The pain denies any urinary symptoms.  She has no fever.  She denies any vaginal bleeding or discharge.  She had a CT scan several days ago which did not show any acute findings.   I reviewed the past medical records.  The patient was seen by her primary care NP Cannady 4 days ago for this right lower quadrant pain.  CT on 12/5 showed no acute abnormality at that time.  Urinalysis was clear.   Physical Exam   Triage Vital Signs: ED Triage Vitals  Enc Vitals Group     BP 04/15/22 1610 123/83     Pulse Rate 04/15/22 1610 80     Resp 04/15/22 1610 18     Temp 04/15/22 1611 97.9 F (36.6 C)     Temp Source 04/15/22 1611 Oral     SpO2 04/15/22 1610 98 %     Weight 04/15/22 1612 175 lb (79.4 kg)     Height 04/15/22 1612 '5\' 8"'$  (1.727 m)     Head Circumference --      Peak Flow --      Pain Score 04/15/22 1612 6     Pain Loc --      Pain Edu? --      Excl. in [place]? --     Most recent vital signs: Vitals:   04/15/22 1741 04/15/22 1800  BP: 124/75 116/84  Pulse: 74 70  Resp:  16  Temp:    SpO2:  99%     General: Alert and oriented, well-appearing. CV:  Good peripheral perfusion.  Resp:  Normal effort.  Abd:  Soft with no significant right lower quadrant tenderness mild right flank tenderness.  No distention.  Other:  Moderate right CVA tenderness.   ED Results / Procedures / Treatments   Labs (all labs ordered are listed, but only abnormal results are displayed) Labs Reviewed  COMPREHENSIVE  METABOLIC PANEL - Abnormal; Notable for the following components:      Result Value   Creatinine, Ser 1.21 (*)    GFR, Estimated 54 (*)    All other components within normal limits  URINALYSIS, ROUTINE W REFLEX MICROSCOPIC - Abnormal; Notable for the following components:   Color, Urine AMBER (*)    APPearance CLOUDY (*)    Protein, ur 30 (*)    Leukocytes,Ua MODERATE (*)    Bacteria, UA FEW (*)    All other components within normal limits  LIPASE, BLOOD  CBC WITH DIFFERENTIAL/PLATELET  POC URINE PREG, ED     EKG     RADIOLOGY  US pelvis: I independently viewed and interpreted the images; there are uterine fibroids with no ovarian cyst or free fluid.  Radiology report indicates no acute abnormalities.  Arterial and venous flow is normal to both ovaries.  PROCEDURES:  Critical Care performed: No  Procedures   MEDICATIONS ORDERED IN ED: Medications  cefTRIAXone (ROCEPHIN) injection 1 g (1 g Intramuscular Given 04/15/22 1832)  lidocaine (PF) (XYLOCAINE)  1 % injection (2.1 mLs  Given 04/15/22 1833)     IMPRESSION / MDM / ASSESSMENT AND PLAN / ED COURSE  I reviewed the triage vital signs and the nursing notes.  51 year old female with PMH as noted above presents with approximately 1 week of right lower quadrant pain that has now migrated mostly to her right flank with no significant associated symptoms.  She had a negative CT abdomen several days ago.  Exam reveals mainly right flank and CVA tenderness rather than abdominal.  Differential diagnosis includes, but is not limited to, UTI/pyelonephritis, ovarian cyst, musculoskeletal pain.  I do not suspect appendicitis given that the patient had a negative CT several days ago for this same pain.  I have a low suspicion for ovarian torsion given that the pain is relatively mild and the patient has not required any pain medication in the ED.  Ultrasound was ordered from triage for further evaluation as well as labs.  Patient's  presentation is most consistent with acute complicated illness / injury requiring diagnostic workup.  ----------------------------------------- 6:37 PM on 04/15/2022 -----------------------------------------  Lab workup is unremarkable with no leukocytosis, normal LFTs and lipase, and a negative pregnancy test.  Urinalysis however shows leukocyte esterase and significant WBCs as well as some bacteria.  Ultrasound shows no cysts, evidence of torsion, or other acute abnormalities besides fibroids.  Overall presentation is consistent with UTI/pyelonephritis.  Given the current fairly lateral location of the pain and the lack of fever, leukocytosis, or any nausea or vomiting, I do not suspect acute appendicitis and there is no indication for a repeat CT today.  I did consider whether the patient may require admission for treatment of pyelonephritis, however she is overall very well-appearing with normal vital signs.  There is no evidence of systemic infection or sepsis.  She has been tolerating p.o., and her pain is well-controlled.  She is appropriate for outpatient treatment.  I ordered a dose of IM ceftriaxone here and prescribed Omnicef for outpatient treatment.  I gave her strict return precautions and she expressed understanding.  She agrees to follow-up with her PMD.   FINAL CLINICAL IMPRESSION(S) / ED DIAGNOSES   Final diagnoses:  Pyelonephritis     Rx / DC Orders   ED Discharge Orders          Ordered    cefdinir (OMNICEF) 300 MG capsule  2 times daily        04/15/22 1836             Note:  This document was prepared using Dragon voice recognition software and may include unintentional dictation errors.    Arta Silence, MD 04/15/22 1850

## 2022-04-15 NOTE — Discharge Instructions (Signed)
The antibiotic as prescribed and finish the full 10-day course.  You may take ibuprofen up to 600 mg every 6 hours as needed for pain.  Follow-up with your primary care doctor.  Return to the ER for new, worsening, or persistent severe pain, vomiting, inability take the medication, fever, weakness, or any other new or worsening symptoms that concern you.

## 2022-04-15 NOTE — ED Provider Triage Note (Signed)
Emergency Medicine Provider Triage Evaluation Note  Mitchell Dannica Bickham , a 51 y.o. female  was evaluated in triage.  Pt complains of right pelvic pain.  Patient presented to the emergency department complaining of ongoing right pelvic pain.  Saw her primary care earlier this week, reassuring CT.  Symptoms have been ongoing and slightly worsening.  Radiates from the right pelvis into the right flank..  Review of Systems  Positive: Right pelvic pain radiating into the right flank Negative: Nausea, vomiting, diarrhea, constipation, hematuria, dysuria, polyuria, vaginal bleeding  Physical Exam  BP 123/83 (BP Location: Left Arm)   Pulse 80   Temp 97.9 F (36.6 C) (Oral)   Resp 18   Ht '5\' 8"'$  (1.727 m)   Wt 79.4 kg   SpO2 98%   BMI 26.61 kg/m  Gen:   Awake, no distress   Resp:  Normal effort  MSK:   Moves extremities without difficulty  Other:  Tender in the right lower quadrant extending into the right lateral abdominal wall.  Medical Decision Making  Medically screening exam initiated at 4:14 PM.  Appropriate orders placed.  Eliany Lakasha Mcfall was informed that the remainder of the evaluation will be completed by another provider, this initial triage assessment does not replace that evaluation, and the importance of remaining in the ED until their evaluation is complete.  Patient presents with ongoing right pelvic pain radiating into the right flank.  Ongoing x 5 to 7 days.  Seeing PCP already with negative CT.  Will order ultrasound, labs, urine at this time.   Darletta Moll, PA-C 04/15/22 1614

## 2022-04-16 NOTE — Patient Instructions (Incomplete)
Abdominal Pain, Adult Many things can cause belly (abdominal) pain. Most times, belly pain is not dangerous. Many cases of belly pain can be watched and treated at home. Sometimes, though, belly pain is serious. Your doctor will try to find the cause of your belly pain. Follow these instructions at home:  Medicines Take over-the-counter and prescription medicines only as told by your doctor. Do not take medicines that help you poop (laxatives) unless told by your doctor. General instructions Watch your belly pain for any changes. Drink enough fluid to keep your pee (urine) pale yellow. Keep all follow-up visits as told by your doctor. This is important. Contact a doctor if: Your belly pain changes or gets worse. You are not hungry, or you lose weight without trying. You are having trouble pooping (constipated) or have watery poop (diarrhea) for more than 2-3 days. You have pain when you pee or poop. Your belly pain wakes you up at night. Your pain gets worse with meals, after eating, or with certain foods. You are vomiting and cannot keep anything down. You have a fever. You have blood in your pee. Get help right away if: Your pain does not go away as soon as your doctor says it should. You cannot stop vomiting. Your pain is only in areas of your belly, such as the right side or the left lower part of the belly. You have bloody or black poop, or poop that looks like tar. You have very bad pain, cramping, or bloating in your belly. You have signs of not having enough fluid or water in your body (dehydration), such as: Dark pee, very little pee, or no pee. Cracked lips. Dry mouth. Sunken eyes. Sleepiness. Weakness. You have trouble breathing or chest pain. Summary Many cases of belly pain can be watched and treated at home. Watch your belly pain for any changes. Take over-the-counter and prescription medicines only as told by your doctor. Contact a doctor if your belly pain  changes or gets worse. Get help right away if you have very bad pain, cramping, or bloating in your belly. This information is not intended to replace advice given to you by your health care provider. Make sure you discuss any questions you have with your health care provider. Document Revised: 09/02/2018 Document Reviewed: 09/02/2018 Elsevier Patient Education  2023 [employer].  

## 2022-04-19 ENCOUNTER — Ambulatory Visit: Payer: BC Managed Care – PPO | Admitting: Nurse Practitioner

## 2022-06-20 ENCOUNTER — Encounter: Payer: Self-pay | Admitting: Nurse Practitioner

## 2022-08-02 ENCOUNTER — Ambulatory Visit: Payer: BC Managed Care – PPO | Admitting: Nurse Practitioner

## 2022-08-04 ENCOUNTER — Encounter: Payer: Self-pay | Admitting: Nurse Practitioner

## 2022-08-04 ENCOUNTER — Ambulatory Visit (INDEPENDENT_AMBULATORY_CARE_PROVIDER_SITE_OTHER): Payer: BC Managed Care – PPO | Admitting: Nurse Practitioner

## 2022-08-04 VITALS — BP 128/82 | HR 76 | Temp 98.1°F | Ht 67.99 in | Wt 193.5 lb

## 2022-08-04 DIAGNOSIS — E6609 Other obesity due to excess calories: Secondary | ICD-10-CM | POA: Diagnosis not present

## 2022-08-04 DIAGNOSIS — Z6830 Body mass index (BMI) 30.0-30.9, adult: Secondary | ICD-10-CM | POA: Diagnosis not present

## 2022-08-04 MED ORDER — WEGOVY 1.7 MG/0.75ML ~~LOC~~ SOAJ: 1.7000 mg | SUBCUTANEOUS | 1 refills | Status: DC

## 2022-08-04 MED ORDER — SAXENDA 18 MG/3ML ~~LOC~~ SOPN: 1.8000 mg | PEN_INJECTOR | Freq: Every day | SUBCUTANEOUS | 5 refills | Status: DC

## 2022-08-04 NOTE — Progress Notes (Signed)
BP 128/82 (BP Location: Left Arm, Patient Position: Sitting, Cuff Size: Normal)   Pulse 76   Temp 98.1 F (36.7 C) (Oral)   Ht 5' 7.99" (1.727 m)   Wt 193 lb 8 oz (87.8 kg)   SpO2 98%   BMI 29.43 kg/m    Subjective:    Patient ID: Alexandra Le, female    DOB: 1971/01/03, 52 y.o.   MRN: [id number]  HPI: Alexandra Le is a 52 y.o. female  Chief Complaint  Patient presents with   Weight Management Screening   WEIGHT MANAGEMENT: Was taking Wegovy 1.7 MG and currently her insurance is covering, however she preferred the Saxenda.  Feels the [facility] wore off quickly -- lost more with Saxenda.  Lost about 30 pounds with Saxenda.  She is currently exercising, walking, a few days a week + starting back at gym.  Is working on healthier meal options and cooking better meals.  No family history of thyroid cancer (MTC, MEN 2, thyroid cell tumors) or pancreatitis.   She would like to see her weight get to 150 lbs.  Has underlying hepatic steatosis and HTN.  Relevant past medical, surgical, family and social history reviewed and updated as indicated. Interim medical history since our last visit reviewed. Allergies and medications reviewed and updated.  Review of Systems  Constitutional:  Negative for activity change, appetite change, diaphoresis, fatigue and fever.  Respiratory:  Negative for cough, chest tightness and shortness of breath.   Cardiovascular:  Negative for chest pain, palpitations and leg swelling.  Gastrointestinal: Negative.   Neurological: Negative.   Psychiatric/Behavioral:  Negative for decreased concentration, self-injury, sleep disturbance and suicidal ideas. The patient is not nervous/anxious.     Per HPI unless specifically indicated above     Objective:    BP 128/82 (BP Location: Left Arm, Patient Position: Sitting, Cuff Size: Normal)   Pulse 76   Temp 98.1 F (36.7 C) (Oral)   Ht 5' 7.99" (1.727 m)   Wt 193 lb 8 oz (87.8 kg)   SpO2 98%   BMI  29.43 kg/m   Wt Readings from Last 3 Encounters:  08/04/22 193 lb 8 oz (87.8 kg)  04/15/22 175 lb (79.4 kg)  04/11/22 185 lb 11.2 oz (84.2 kg)    Physical Exam Vitals and nursing note reviewed.  Constitutional:      General: She is awake. She is not in acute distress.    Appearance: She is well-developed and well-groomed. She is obese. She is not ill-appearing or toxic-appearing.  HENT:     Head: Normocephalic.     Right Ear: Hearing and external ear normal.     Left Ear: Hearing and external ear normal.  Eyes:     General: Lids are normal.        Right eye: No discharge.        Left eye: No discharge.     Conjunctiva/sclera: Conjunctivae normal.     Pupils: Pupils are equal, round, and reactive to light.  Neck:     Thyroid: No thyromegaly.     Vascular: No carotid bruit.  Cardiovascular:     Rate and Rhythm: Normal rate and regular rhythm.     Heart sounds: Normal heart sounds. No murmur heard.    No gallop.  Pulmonary:     Effort: Pulmonary effort is normal.     Breath sounds: Normal breath sounds.  Abdominal:     General: Bowel sounds are normal. There is no distension.  Palpations: Abdomen is soft.     Tenderness: There is no abdominal tenderness.  Musculoskeletal:     Cervical back: Normal range of motion and neck supple.     Right lower leg: No edema.     Left lower leg: No edema.  Lymphadenopathy:     Cervical: No cervical adenopathy.  Skin:    General: Skin is warm and dry.  Neurological:     Mental Status: She is alert and oriented to person, place, and time.  Psychiatric:        Attention and Perception: Attention normal.        Mood and Affect: Mood normal.        Behavior: Behavior normal. Behavior is cooperative.        Thought Content: Thought content normal.        Judgment: Judgment normal.     Results for orders placed or performed during the hospital encounter of 04/15/22  Comprehensive metabolic panel  Result Value Ref Range   Sodium 137  135 - 145 mmol/L   Potassium 3.9 3.5 - 5.1 mmol/L   Chloride 105 98 - 111 mmol/L   CO2 25 22 - 32 mmol/L   Glucose, Bld 86 70 - 99 mg/dL   BUN 17 6 - 20 mg/dL   Creatinine, Ser 1.21 (H) 0.44 - 1.00 mg/dL   Calcium 9.6 8.9 - 10.3 mg/dL   Total Protein 8.1 6.5 - 8.1 g/dL   Albumin 4.6 3.5 - 5.0 g/dL   AST 23 15 - 41 U/L   ALT 18 0 - 44 U/L   Alkaline Phosphatase 75 38 - 126 U/L   Total Bilirubin 0.8 0.3 - 1.2 mg/dL   GFR, Estimated 54 (L) >60 mL/min   Anion gap 7 5 - 15  Lipase, blood  Result Value Ref Range   Lipase 42 11 - 51 U/L  CBC with Differential  Result Value Ref Range   WBC 6.8 4.0 - 10.5 K/uL   RBC 4.93 3.87 - 5.11 MIL/uL   Hemoglobin 14.5 12.0 - 15.0 g/dL   HCT 43.9 36.0 - 46.0 %   MCV 89.0 80.0 - 100.0 fL   MCH 29.4 26.0 - 34.0 pg   MCHC 33.0 30.0 - 36.0 g/dL   RDW 13.5 11.5 - 15.5 %   Platelets 319 150 - 400 K/uL   nRBC 0.0 0.0 - 0.2 %   Neutrophils Relative % 63 %   Neutro Abs 4.3 1.7 - 7.7 K/uL   Lymphocytes Relative 24 %   Lymphs Abs 1.6 0.7 - 4.0 K/uL   Monocytes Relative 8 %   Monocytes Absolute 0.6 0.1 - 1.0 K/uL   Eosinophils Relative 4 %   Eosinophils Absolute 0.3 0.0 - 0.5 K/uL   Basophils Relative 1 %   Basophils Absolute 0.1 0.0 - 0.1 K/uL   Immature Granulocytes 0 %   Abs Immature Granulocytes 0.03 0.00 - 0.07 K/uL  Urinalysis, Routine w reflex microscopic  Result Value Ref Range   Color, Urine AMBER (A) YELLOW   APPearance CLOUDY (A) CLEAR   Specific Gravity, Urine 1.019 1.005 - 1.030   pH 5.0 5.0 - 8.0   Glucose, UA NEGATIVE NEGATIVE mg/dL   Hgb urine dipstick NEGATIVE NEGATIVE   Bilirubin Urine NEGATIVE NEGATIVE   Ketones, ur NEGATIVE NEGATIVE mg/dL   Protein, ur 30 (A) NEGATIVE mg/dL   Nitrite NEGATIVE NEGATIVE   Leukocytes,Ua MODERATE (A) NEGATIVE   RBC / HPF 0-5 0 - 5 RBC/hpf  WBC, UA 11-20 0 - 5 WBC/hpf   Bacteria, UA FEW (A) NONE SEEN   Squamous Epithelial / HPF 21-50 0 - 5   Mucus PRESENT    Hyaline Casts, UA PRESENT    POC urine preg, ED  Result Value Ref Range   Preg Test, Ur NEGATIVE NEGATIVE      Assessment & Plan:   Problem List Items Addressed This Visit       Other   Obesity - Primary    BMI 29.43 -- had been losing weight with Saxenda and would like to restart.  Script sent for 1.8 MG dosing.  Maintain this regimen.  Recommended eating smaller high protein, low fat meals more frequently and exercising 30 mins a day 5 times a week with a goal of 10-15lb weight loss in the next 3 months. Patient voiced their understanding and motivation to adhere to these recommendations.  Return in 8 weeks.       Relevant Medications   Liraglutide -Weight Management (SAXENDA) 18 MG/3ML SOPN     Follow up plan: Return in about 8 weeks (around 09/29/2022) for WEIGHT CHECK, CKD, HTN/HLD, FATTY LIVER.

## 2022-08-04 NOTE — Patient Instructions (Signed)

## 2022-08-04 NOTE — Assessment & Plan Note (Signed)
BMI 29.43 -- had been losing weight with Saxenda and would like to restart.  Script sent for 1.8 MG dosing.  Maintain this regimen.  Recommended eating smaller high protein, low fat meals more frequently and exercising 30 mins a day 5 times a week with a goal of 10-15lb weight loss in the next 3 months. Patient voiced their understanding and motivation to adhere to these recommendations.  Return in 8 weeks.

## 2022-08-09 ENCOUNTER — Ambulatory Visit: Payer: BC Managed Care – PPO | Admitting: Nurse Practitioner

## 2022-08-16 ENCOUNTER — Encounter: Payer: Self-pay | Admitting: Nurse Practitioner

## 2022-08-22 ENCOUNTER — Ambulatory Visit: Payer: BC Managed Care – PPO | Admitting: Nurse Practitioner

## 2022-09-29 ENCOUNTER — Ambulatory Visit: Payer: BC Managed Care – PPO | Admitting: Nurse Practitioner

## 2022-11-03 ENCOUNTER — Encounter: Payer: Self-pay | Admitting: Nurse Practitioner

## 2022-11-03 ENCOUNTER — Other Ambulatory Visit: Payer: Self-pay | Admitting: Nurse Practitioner

## 2022-11-03 NOTE — Telephone Encounter (Signed)
Medication Refill - Medication: Semaglutide-Weight Management (WEGOVY) 1.7 MG/0.75ML SOAJ   Pt requested this yesterday via her pharmacy. Please advise    Has the patient contacted their pharmacy? Yes.   (Agent: If no, request that the patient contact the pharmacy for the refill. If patient does not wish to contact the pharmacy document the reason why and proceed with request.) (Agent: If yes, when and what did the pharmacy advise?)  Preferred Pharmacy (with phone number or street name):  [facility] [phone] [address]  [address] [place] [address]  Phone: [phone] Fax: [phone]   Has the patient been seen for an appointment in the last year OR does the patient have an upcoming appointment? Yes.    Agent: Please be advised that RX refills may take up to 3 business days. We ask that you follow-up with your pharmacy.

## 2022-11-06 MED ORDER — WEGOVY 1.7 MG/0.75ML ~~LOC~~ SOAJ: 1.7000 mg | SUBCUTANEOUS | 0 refills | Status: DC

## 2022-11-06 NOTE — Telephone Encounter (Signed)
Called and scheduled patient on 12/01/2022 @ 9:20 am.

## 2022-11-06 NOTE — Telephone Encounter (Signed)
Requested medication (s) are due for refill today: yes  Requested medication (s) are on the active medication list: yes  Last refill:  08/04/22 3 ml 1 RF  Future visit scheduled: no Notes to clinic:  MyChart message sent to pt to make appt- pt has canceled twice and 1 no show - was due to come back for appt end of May.    Requested Prescriptions  Pending Prescriptions Disp Refills   Semaglutide-Weight Management (WEGOVY) 1.7 MG/0.75ML SOAJ 3 mL 1    Sig: Inject 1.7 mg into the skin once a week.     Endocrinology:  Diabetes - GLP-1 Receptor Agonists - semaglutide Failed - 11/03/2022  4:30 PM      Failed - HBA1C in normal range and within 180 days    No results found for: "HGBA1C", "LABA1C"       Failed - Cr in normal range and within 360 days    Creatinine, Ser  Date Value Ref Range Status  04/15/2022 1.21 (H) 0.44 - 1.00 mg/dL Final         Passed - Valid encounter within last 6 months    Recent Outpatient Visits           3 months ago Class 1 obesity due to excess calories without serious comorbidity with body mass index (BMI) of 30.0 to 30.9 in adult   [place] [facility] [place], [place] T, NP   6 months ago RLQ abdominal pain   [place] [facility] [place], [place] T, NP   10 months ago Chronic kidney disease, stage 3a (HCC)   [place] [facility] [place], Corrie Dandy T, NP   1 year ago Severe episode of recurrent major depressive disorder, without psychotic features (HCC)   [place] [facility] [place], Corrie Dandy T, NP   1 year ago Cellulitis of left lower extremity   [place] [facility] [place], Dorie Rank, NP

## 2022-11-07 ENCOUNTER — Telehealth: Payer: Self-pay

## 2022-11-07 NOTE — Telephone Encounter (Signed)
PA for [facility] initiated and submitted via Cover My Meds. Key: [id number]

## 2022-11-08 NOTE — Telephone Encounter (Signed)
PA approved. Patient notified of approval via Mychart message.  

## 2022-11-26 NOTE — Patient Instructions (Signed)

## 2022-11-28 ENCOUNTER — Other Ambulatory Visit: Payer: Self-pay | Admitting: Nurse Practitioner

## 2022-11-29 NOTE — Telephone Encounter (Signed)
Requested Prescriptions  Pending Prescriptions Disp Refills   benazepril (LOTENSIN) 40 MG tablet [Pharmacy Med Name: Benazepril HCl 40 MG Oral Tablet] 90 tablet 0    Sig: TAKE 1 TABLET BY MOUTH DAILY     Cardiovascular:  ACE Inhibitors Failed - 11/28/2022  8:57 AM      Failed - Cr in normal range and within 180 days    Creatinine, Ser  Date Value Ref Range Status  04/15/2022 1.21 (H) 0.44 - 1.00 mg/dL Final         Failed - K in normal range and within 180 days    Potassium  Date Value Ref Range Status  04/15/2022 3.9 3.5 - 5.1 mmol/L Final         Passed - Patient is not pregnant      Passed - Last BP in normal range    BP Readings from Last 1 Encounters:  08/04/22 128/82         Passed - Valid encounter within last 6 months    Recent Outpatient Visits           3 months ago Class 1 obesity due to excess calories without serious comorbidity with body mass index (BMI) of 30.0 to 30.9 in adult   [place] [facility] [place], Corrie Dandy T, NP   7 months ago RLQ abdominal pain   [place] [facility] [place], [place] T, NP   11 months ago Chronic kidney disease, stage 3a (HCC)   [place] [facility] [place], Corrie Dandy T, NP   1 year ago Severe episode of recurrent major depressive disorder, without psychotic features (HCC)   [place] [facility] [place], Corrie Dandy T, NP   1 year ago Cellulitis of left lower extremity   [place] [facility] [place], Dorie Rank, NP       Future Appointments             In 2 days Marjie Skiff, NP [place] [facility]

## 2022-12-01 ENCOUNTER — Encounter: Payer: Self-pay | Admitting: Nurse Practitioner

## 2022-12-01 ENCOUNTER — Ambulatory Visit (INDEPENDENT_AMBULATORY_CARE_PROVIDER_SITE_OTHER): Payer: BC Managed Care – PPO | Admitting: Nurse Practitioner

## 2022-12-01 VITALS — BP 123/79 | HR 65 | Temp 97.9°F | Ht 67.3 in | Wt 179.8 lb

## 2022-12-01 DIAGNOSIS — F332 Major depressive disorder, recurrent severe without psychotic features: Secondary | ICD-10-CM | POA: Diagnosis not present

## 2022-12-01 DIAGNOSIS — Z6827 Body mass index (BMI) 27.0-27.9, adult: Secondary | ICD-10-CM

## 2022-12-01 DIAGNOSIS — M5441 Lumbago with sciatica, right side: Secondary | ICD-10-CM | POA: Diagnosis not present

## 2022-12-01 DIAGNOSIS — N1831 Chronic kidney disease, stage 3a: Secondary | ICD-10-CM

## 2022-12-01 DIAGNOSIS — Z79899 Other long term (current) drug therapy: Secondary | ICD-10-CM

## 2022-12-01 DIAGNOSIS — F411 Generalized anxiety disorder: Secondary | ICD-10-CM

## 2022-12-01 DIAGNOSIS — G8929 Other chronic pain: Secondary | ICD-10-CM

## 2022-12-01 DIAGNOSIS — I10 Essential (primary) hypertension: Secondary | ICD-10-CM

## 2022-12-01 DIAGNOSIS — F5101 Primary insomnia: Secondary | ICD-10-CM

## 2022-12-01 MED ORDER — TRAZODONE HCL 50 MG PO TABS: 25.0000 mg | ORAL_TABLET | Freq: Every evening | ORAL | 4 refills | Status: DC | PRN

## 2022-12-01 MED ORDER — WEGOVY 2.4 MG/0.75ML ~~LOC~~ SOAJ: 2.4000 mg | SUBCUTANEOUS | 2 refills | Status: DC

## 2022-12-01 MED ORDER — TRAMADOL HCL 50 MG PO TABS: 50.0000 mg | ORAL_TABLET | Freq: Four times a day (QID) | ORAL | 0 refills | Status: AC | PRN

## 2022-12-01 MED ORDER — BENAZEPRIL HCL 40 MG PO TABS: 40.0000 mg | ORAL_TABLET | Freq: Every day | ORAL | 4 refills | Status: DC

## 2022-12-01 MED ORDER — VENLAFAXINE HCL ER 75 MG PO CP24: 75.0000 mg | ORAL_CAPSULE | Freq: Every day | ORAL | 4 refills | Status: DC

## 2022-12-01 NOTE — Assessment & Plan Note (Signed)
Chronic, stable.  She denies SI/HI today.  Will continue current medication regimen and adjust as needed.  Recommend therapy in future.  Return in 6 months.

## 2022-12-01 NOTE — Assessment & Plan Note (Signed)
Stable on recent labs.  Continue Benazepril for kidney protection.  Recommend increased water intake at home and decrease Ibuprofen use.  Recheck CMP and urine ALB at physical.

## 2022-12-01 NOTE — Assessment & Plan Note (Signed)
Refer to depression plan of care. 

## 2022-12-01 NOTE — Assessment & Plan Note (Signed)
Chronic, ongoing.  Continue Trazodone as offers benefit to sleep, recommend she take this nightly for full benefit.  Recommend focus on healthy sleep hygiene.

## 2022-12-01 NOTE — Assessment & Plan Note (Signed)
Minimal Tramadol use, signed contract today with patient (12/01/22).

## 2022-12-01 NOTE — Assessment & Plan Note (Signed)
Chronic, stable.  BP at goal in office today.  Continue current medication regimen, Benazepril daily, adjust as needed.  Could consider addition of BB if return of palpitations in future.  Recommend she monitor BP at home at least a few days a week and document.  DASH diet recommended.  LABS: update at physical.  Return in 6 months.

## 2022-12-01 NOTE — Assessment & Plan Note (Addendum)
OK for occasional Tramadol use.  Recommend frequent stretching and yoga at home.  UDS today.

## 2022-12-01 NOTE — Progress Notes (Signed)
BP 123/79   Pulse 65   Temp 97.9 F (36.6 C) (Oral)   Ht 5' 7.3" (1.709 m)   Wt 179 lb 12.8 oz (81.6 kg)   SpO2 99%   BMI 27.91 kg/m    Subjective:    Patient ID: Alexandra Le, female    DOB: [birth year], 52 y.o.   MRN: [id number]  HPI: Alexandra Le is a 52 y.o. female  Chief Complaint  Patient presents with   Weight Check   Pain Management    Pt states she would like to get a refill on Tramadol. States she takes this only PRN   WEIGHT GAIN Taking Wegovy 1.7 MG weekly and tolerating well. She is paying out of pocket for it.  Has lost 14 pounds since March.  She is currently exercising, walking, a few days a week + starting back at gym.  Is working on healthier meal options and cooking better meals.  No family history of thyroid cancer (MTC, MEN 2, thyroid cell tumors) or pancreatitis.   She would like to see her weight get to 150 lbs.  Has underlying hepatic steatosis and HTN.  Taking Benazepril only at this time for BP, which is kidney protective with CKD noted on past labs. Duration: chronic Previous attempts at weight loss: yes Complications of obesity: HTN, elevated LDL Peak weight: 205 lbs Weight loss goal: 150 lbs Weight loss to date: 14 pounds Requesting obesity pharmacotherapy: no Current weight loss supplements/medications: yes Previous weight loss supplements/meds: yes Calories:  1800  CHRONIC PAIN  Takes occasional Tramadol, started by previous PCP many years ago.  Last filled 07/26/21 for 20 tablets, this lasted over one year.  Uses mainly if over exertion and pain present.  She reports continued stable mood (depression currently active and recurrent without psychotic features) with Effexor and then Trazodone at bedtime for sleep. Pain control status: stable Duration: chronic Location: lower back Quality: sharp, aching, and throbbing Current Pain Level: 0/10 today Previous Pain Level: 10/10 Breakthrough pain: no Benefit from narcotic  medications: yes What Activities task can be accomplished with current medication? All ADLs Interested in weaning off narcotics:no   Stool softners/OTC fiber: no  Previous pain specialty evaluation: no Non-narcotic analgesic meds: yes Narcotic contract: yes     12/01/2022    9:17 AM 08/04/2022   10:50 AM 04/11/2022   11:42 AM 12/30/2021    1:46 PM 09/30/2021   10:37 AM  Depression screen PHQ 2/9  Decreased Interest 0 0 0 0 1  Down, Depressed, Hopeless 0 0 0 0 1  PHQ - 2 Score 0 0 0 0 2  Altered sleeping 3 1 2 3 2   Tired, decreased energy 2 1 1 1 2   Change in appetite 0 1 0 0 2  Feeling bad or failure about yourself  0 0 0 0 1  Trouble concentrating 1 1 1 1 1   Moving slowly or fidgety/restless 1 1 0 0 2  Suicidal thoughts 0 0 0 0 1  PHQ-9 Score 7 5 4 5 13   Difficult doing work/chores Somewhat difficult Not difficult at all Not difficult at all Not difficult at all        12/01/2022    9:17 AM 08/04/2022   10:50 AM 04/11/2022   11:43 AM 12/30/2021    1:47 PM  GAD 7 : Generalized Anxiety Score  Nervous, Anxious, on Edge 0 0 0 0  Control/stop worrying 0 0 0 0  Worry too much - different  things 0 0 0 0  Trouble relaxing 1 0 1 1  Restless 1 0 0 0  Easily annoyed or irritable 1 1 0 1  Afraid - awful might happen 0 0 0 0  Total GAD 7 Score 3 1 1 2   Anxiety Difficulty Not difficult at all Not difficult at all Not difficult at all Not difficult at all      Relevant past medical, surgical, family and social history reviewed and updated as indicated. Interim medical history since our last visit reviewed. Allergies and medications reviewed and updated.  Review of Systems  Constitutional:  Negative for activity change, appetite change, diaphoresis, fatigue and fever.  Respiratory:  Negative for cough, chest tightness and shortness of breath.   Cardiovascular:  Negative for chest pain, palpitations and leg swelling.  Gastrointestinal: Negative.   Musculoskeletal:  Positive for  arthralgias.  Neurological: Negative.   Psychiatric/Behavioral:  Negative for decreased concentration, self-injury, sleep disturbance and suicidal ideas. The patient is not nervous/anxious.     Per HPI unless specifically indicated above     Objective:    BP 123/79   Pulse 65   Temp 97.9 F (36.6 C) (Oral)   Ht 5' 7.3" (1.709 m)   Wt 179 lb 12.8 oz (81.6 kg)   SpO2 99%   BMI 27.91 kg/m   Wt Readings from Last 3 Encounters:  12/01/22 179 lb 12.8 oz (81.6 kg)  08/04/22 193 lb 8 oz (87.8 kg)  04/15/22 175 lb (79.4 kg)    Physical Exam Vitals and nursing note reviewed.  Constitutional:      General: She is awake. She is not in acute distress.    Appearance: She is well-developed and well-groomed. She is obese. She is not ill-appearing or toxic-appearing.  HENT:     Head: Normocephalic.     Right Ear: Hearing and external ear normal.     Left Ear: Hearing and external ear normal.  Eyes:     General: Lids are normal.        Right eye: No discharge.        Left eye: No discharge.     Conjunctiva/sclera: Conjunctivae normal.     Pupils: Pupils are equal, round, and reactive to light.  Neck:     Thyroid: No thyromegaly.     Vascular: No carotid bruit.  Cardiovascular:     Rate and Rhythm: Normal rate and regular rhythm.     Heart sounds: Normal heart sounds. No murmur heard.    No gallop.  Pulmonary:     Effort: Pulmonary effort is normal.     Breath sounds: Normal breath sounds.  Abdominal:     General: Bowel sounds are normal. There is no distension.     Palpations: Abdomen is soft.     Tenderness: There is no abdominal tenderness.  Musculoskeletal:     Cervical back: Normal range of motion and neck supple.     Right lower leg: No edema.     Left lower leg: No edema.  Lymphadenopathy:     Cervical: No cervical adenopathy.  Skin:    General: Skin is warm and dry.  Neurological:     Mental Status: She is alert and oriented to person, place, and time.   Psychiatric:        Attention and Perception: Attention normal.        Mood and Affect: Mood normal.        Behavior: Behavior normal. Behavior is cooperative.  Thought Content: Thought content normal.        Judgment: Judgment normal.    Results for orders placed or performed during the hospital encounter of 04/15/22  Comprehensive metabolic panel  Result Value Ref Range   Sodium 137 135 - 145 mmol/L   Potassium 3.9 3.5 - 5.1 mmol/L   Chloride 105 98 - 111 mmol/L   CO2 25 22 - 32 mmol/L   Glucose, Bld 86 70 - 99 mg/dL   BUN 17 6 - 20 mg/dL   Creatinine, Ser 1.61 (H) 0.44 - 1.00 mg/dL   Calcium 9.6 8.9 - 09.6 mg/dL   Total Protein 8.1 6.5 - 8.1 g/dL   Albumin 4.6 3.5 - 5.0 g/dL   AST 23 15 - 41 U/L   ALT 18 0 - 44 U/L   Alkaline Phosphatase 75 38 - 126 U/L   Total Bilirubin 0.8 0.3 - 1.2 mg/dL   GFR, Estimated 54 (L) >60 mL/min   Anion gap 7 5 - 15  Lipase, blood  Result Value Ref Range   Lipase 42 11 - 51 U/L  CBC with Differential  Result Value Ref Range   WBC 6.8 4.0 - 10.5 K/uL   RBC 4.93 3.87 - 5.11 MIL/uL   Hemoglobin 14.5 12.0 - 15.0 g/dL   HCT 04.5 40.9 - 81.1 %   MCV 89.0 80.0 - 100.0 fL   MCH 29.4 26.0 - 34.0 pg   MCHC 33.0 30.0 - 36.0 g/dL   RDW 91.4 78.2 - 95.6 %   Platelets 319 150 - 400 K/uL   nRBC 0.0 0.0 - 0.2 %   Neutrophils Relative % 63 %   Neutro Abs 4.3 1.7 - 7.7 K/uL   Lymphocytes Relative 24 %   Lymphs Abs 1.6 0.7 - 4.0 K/uL   Monocytes Relative 8 %   Monocytes Absolute 0.6 0.1 - 1.0 K/uL   Eosinophils Relative 4 %   Eosinophils Absolute 0.3 0.0 - 0.5 K/uL   Basophils Relative 1 %   Basophils Absolute 0.1 0.0 - 0.1 K/uL   Immature Granulocytes 0 %   Abs Immature Granulocytes 0.03 0.00 - 0.07 K/uL  Urinalysis, Routine w reflex microscopic  Result Value Ref Range   Color, Urine AMBER (A) YELLOW   APPearance CLOUDY (A) CLEAR   Specific Gravity, Urine 1.019 1.005 - 1.030   pH 5.0 5.0 - 8.0   Glucose, UA NEGATIVE NEGATIVE mg/dL    Hgb urine dipstick NEGATIVE NEGATIVE   Bilirubin Urine NEGATIVE NEGATIVE   Ketones, ur NEGATIVE NEGATIVE mg/dL   Protein, ur 30 (A) NEGATIVE mg/dL   Nitrite NEGATIVE NEGATIVE   Leukocytes,Ua MODERATE (A) NEGATIVE   RBC / HPF 0-5 0 - 5 RBC/hpf   WBC, UA 11-20 0 - 5 WBC/hpf   Bacteria, UA FEW (A) NONE SEEN   Squamous Epithelial / HPF 21-50 0 - 5   Mucus PRESENT    Hyaline Casts, UA PRESENT   POC urine preg, ED  Result Value Ref Range   Preg Test, Ur NEGATIVE NEGATIVE      Assessment & Plan:   Problem List Items Addressed This Visit       Cardiovascular and Mediastinum   Hypertension    Chronic, stable.  BP at goal in office today.  Continue current medication regimen, Benazepril daily, adjust as needed.  Could consider addition of BB if return of palpitations in future.  Recommend she monitor BP at home at least a few days a week and  document.  DASH diet recommended.  LABS: update at physical.  Return in 6 months.      Relevant Medications   benazepril (LOTENSIN) 40 MG tablet     Genitourinary   Chronic kidney disease, stage 3a (HCC)    Stable on recent labs.  Continue Benazepril for kidney protection.  Recommend increased water intake at home and decrease Ibuprofen use.  Recheck CMP and urine ALB at physical.        Other   BMI 27.0-27.9,adult    BMI 27.91 -- had been losing weight with ZHYQMV.  Script sent for 2.4 MG dosing to start in September.  Maintain this regimen.  Recommended eating smaller high protein, low fat meals more frequently and exercising 30 mins a day 5 times a week with a goal of 10-15lb weight loss in the next 3 months. Patient voiced their understanding and motivation to adhere to these recommendations.         Controlled substance agreement signed    Minimal Tramadol use, signed contract today with patient (12/01/22).      Depression - Primary    Chronic, stable.  She denies SI/HI today.  Will continue current medication regimen and adjust as  needed.  Recommend therapy in future.  Return in 6 months.      Relevant Medications   venlafaxine XR (EFFEXOR-XR) 75 MG 24 hr capsule   traZODone (DESYREL) 50 MG tablet   Generalized anxiety disorder    Refer to depression plan of care.      Relevant Medications   venlafaxine XR (EFFEXOR-XR) 75 MG 24 hr capsule   traZODone (DESYREL) 50 MG tablet   Insomnia    Chronic, ongoing.  Continue Trazodone as offers benefit to sleep, recommend she take this nightly for full benefit.  Recommend focus on healthy sleep hygiene.        Lumbago    OK for occasional Tramadol use.  Recommend frequent stretching and yoga at home.  UDS today.      Relevant Medications   traMADol (ULTRAM) 50 MG tablet   Other Relevant Orders   [id number] 11+Oxyco+Alc+Crt-Bund     Follow up plan: Return in about 6 months (around 06/03/2023) for Annual Physical.

## 2022-12-01 NOTE — Assessment & Plan Note (Signed)
BMI 27.91 -- had been losing weight with Wegovy.  Script sent for 2.4 MG dosing to start in September.  Maintain this regimen.  Recommended eating smaller high protein, low fat meals more frequently and exercising 30 mins a day 5 times a week with a goal of 10-15lb weight loss in the next 3 months. Patient voiced their understanding and motivation to adhere to these recommendations.

## 2022-12-05 ENCOUNTER — Other Ambulatory Visit: Payer: Self-pay | Admitting: Nurse Practitioner

## 2022-12-05 MED ORDER — WEGOVY 2.4 MG/0.75ML ~~LOC~~ SOAJ: 2.4000 mg | SUBCUTANEOUS | 2 refills | Status: DC

## 2022-12-05 MED ORDER — WEGOVY 1.7 MG/0.75ML ~~LOC~~ SOAJ: 1.7000 mg | SUBCUTANEOUS | 0 refills | Status: DC

## 2022-12-05 NOTE — Addendum Note (Signed)
Addended by: Aura Dials T on: 12/05/2022 12:31 PM   Modules accepted: Orders

## 2022-12-21 ENCOUNTER — Telehealth: Payer: BC Managed Care – PPO | Admitting: Nurse Practitioner

## 2022-12-21 DIAGNOSIS — J039 Acute tonsillitis, unspecified: Secondary | ICD-10-CM | POA: Diagnosis not present

## 2022-12-21 MED ORDER — AMOXICILLIN 500 MG PO CAPS: 500.0000 mg | ORAL_CAPSULE | Freq: Two times a day (BID) | ORAL | 0 refills | Status: AC

## 2022-12-21 NOTE — Progress Notes (Signed)
Virtual Visit Consent   Alexandra Le, you are scheduled for a virtual visit with a [facility] provider today. Just as with appointments in the office, your consent must be obtained to participate. Your consent will be active for this visit and any virtual visit you may have with one of our providers in the next 365 days. If you have a MyChart account, a copy of this consent can be sent to you electronically.  As this is a virtual visit, video technology does not allow for your provider to perform a traditional examination. This may limit your provider's ability to fully assess your condition. If your provider identifies any concerns that need to be evaluated in person or the need to arrange testing (such as labs, EKG, etc.), we will make arrangements to do so. Although advances in technology are sophisticated, we cannot ensure that it will always work on either your end or our end. If the connection with a video visit is poor, the visit may have to be switched to a telephone visit. With either a video or telephone visit, we are not always able to ensure that we have a secure connection.  By engaging in this virtual visit, you consent to the provision of healthcare and authorize for your insurance to be billed (if applicable) for the services provided during this visit. Depending on your insurance coverage, you may receive a charge related to this service.  I need to obtain your verbal consent now. Are you willing to proceed with your visit today? Alexandra Le has provided verbal consent on 12/21/2022 for a virtual visit (video or telephone). Viviano Simas, FNP  Date: 12/21/2022 12:11 PM  Virtual Visit via Video Note   I, Viviano Simas, connected with  Alexandra Le  ([id number], 1971-04-15) on 12/21/22 at 12:15 PM EDT by a video-enabled telemedicine application and verified that I am speaking with the correct person using two identifiers.  Location: Patient: Virtual Visit  Location Patient: Home Provider: Virtual Visit Location Provider: Home Office   I discussed the limitations of evaluation and management by telemedicine and the availability of in person appointments. The patient expressed understanding and agreed to proceed.    History of Present Illness: Alexandra Le is a 52 y.o. who identifies as a female who was assigned female at birth, and is being seen today for sore throat.   Symptom onset was 3-4 days ago  COVID test was negative   She looked in her throat today and saw what she explains as a "puss pocket"  She has some discomfort in her ears as well   No fever   She denies sinus congestion or post nasal drainage   She works from home  Denies any sick contacts  She does watch her grandchildren   She has used cough medication without relief  Doesn't have much of a cough unless she lays down flat   She denies any SOB or wheezing   Problems:  Patient Active Problem List   Diagnosis Date Noted   Controlled substance agreement signed 12/01/2022   Hepatic steatosis 09/03/2021   Elevated low density lipoprotein (LDL) cholesterol level 03/07/2021   BMI 27.0-27.9,adult 03/06/2021   Vitamin D deficiency 02/12/2021   Chronic kidney disease, stage 3a (HCC) 02/12/2021   Elevated LFTs 07/29/2019   Polyp of sigmoid colon    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   Generalized anxiety disorder 02/11/2015   Hypertension 02/11/2015   Depression 02/11/2015   Insomnia 02/11/2015  Lumbago 02/11/2015    Allergies: No Known Allergies Medications:  Current Outpatient Medications:    benazepril (LOTENSIN) 40 MG tablet, Take 1 tablet (40 mg total) by mouth daily., Disp: 90 tablet, Rfl: 4   [START ON 01/08/2023] Semaglutide-Weight Management (WEGOVY) 1.7 MG/0.75ML SOAJ, Inject 1.7 mg into the skin once a week., Disp: 3 mL, Rfl: 0   [START ON 02/07/2023] Semaglutide-Weight Management (WEGOVY) 2.4 MG/0.75ML SOAJ, Inject 2.4 mg into the  skin once a week., Disp: 3 mL, Rfl: 2   traMADol (ULTRAM) 50 MG tablet, Take 1 tablet (50 mg total) by mouth every 6 (six) hours as needed for severe pain., Disp: 30 tablet, Rfl: 0   traZODone (DESYREL) 50 MG tablet, Take 0.5-1 tablets (25-50 mg total) by mouth at bedtime as needed for sleep., Disp: 90 tablet, Rfl: 4   venlafaxine XR (EFFEXOR-XR) 75 MG 24 hr capsule, Take 1 capsule (75 mg total) by mouth daily with breakfast., Disp: 90 capsule, Rfl: 4  Observations/Objective: Patient is well-developed, well-nourished in no acute distress.  Resting comfortably  at home.  Head is normocephalic, atraumatic.  No labored breathing.  Speech is clear and coherent with logical content.  Patient is alert and oriented at baseline.    Assessment and Plan:  1. Tonsillitis with exudate  - amoxicillin (AMOXIL) 500 MG capsule; Take 1 capsule (500 mg total) by mouth 2 (two) times daily for 10 days.  Dispense: 20 capsule; Refill: 0    Recommended salt water gargles for added relief  Sore throat lozenges  Push fluids   Follow up if symptoms persist or if one tonsil remains enlarged with drainage present as discussed    Follow Up Instructions: I discussed the assessment and treatment plan with the patient. The patient was provided an opportunity to ask questions and all were answered. The patient agreed with the plan and demonstrated an understanding of the instructions.  A copy of instructions were sent to the patient via MyChart unless otherwise noted below.    The patient was advised to call back or seek an in-person evaluation if the symptoms worsen or if the condition fails to improve as anticipated.  Time:  I spent 10 minutes with the patient via telehealth technology discussing the above problems/concerns.    Viviano Simas, FNP

## 2023-01-29 ENCOUNTER — Telehealth: Payer: Self-pay | Admitting: Physician Assistant

## 2023-01-29 NOTE — Telephone Encounter (Signed)
I called the patient to schedule her for and earlier appointment.

## 2023-02-20 ENCOUNTER — Other Ambulatory Visit: Payer: Self-pay

## 2023-02-20 ENCOUNTER — Ambulatory Visit (INDEPENDENT_AMBULATORY_CARE_PROVIDER_SITE_OTHER): Payer: BC Managed Care – PPO | Admitting: Physician Assistant

## 2023-02-20 ENCOUNTER — Encounter: Payer: Self-pay | Admitting: Physician Assistant

## 2023-02-20 VITALS — BP 135/86 | HR 71 | Temp 97.3°F | Ht 67.3 in | Wt 180.5 lb

## 2023-02-20 DIAGNOSIS — Z860100 Personal history of colon polyps, unspecified: Secondary | ICD-10-CM

## 2023-02-20 DIAGNOSIS — Z09 Encounter for follow-up examination after completed treatment for conditions other than malignant neoplasm: Secondary | ICD-10-CM

## 2023-02-20 DIAGNOSIS — Z860101 Personal history of adenomatous and serrated colon polyps: Secondary | ICD-10-CM

## 2023-02-20 MED ORDER — NA SULFATE-K SULFATE-MG SULF 17.5-3.13-1.6 GM/177ML PO SOLN: 354.0000 mL | Freq: Once | ORAL | 0 refills | Status: AC

## 2023-02-20 NOTE — Progress Notes (Signed)
Celso Amy, PA-C [address]  [address]  [place], [place] [address]  Main: [phone]  Fax: [phone]   Gastroenterology Consultation  Referring Provider:     Marjie Skiff, NP Primary Care Physician:  Marjie Skiff, NP Primary Gastroenterologist:  Celso Amy, PA-C / Dr. Midge Minium   Reason for Consultation:     Repeat Colonoscopy; Hx Colon Polyps        HPI:   Alexandra Le is a 52 y.o. y.o. y/o female referred for consultation & management  by Marjie Skiff, NP.    Patient is here to schedule repeat surveillance colonoscopy.  Has personal history of adenomatous colon polyps.  She denies any GI symptoms.  GERD is controlled on PPI.  Occasionally takes MiraLAX as needed for mild intermittent constipation.  Last colonoscopy 07/2016 by Dr. Servando Snare showed small nonbleeding internal hemorrhoids, 2 small tubular adenoma polyps (3 mm, 6 mm) removed.  Excellent prep.  Repeat colonoscopy in 5 to 7 years.  Past Medical History:  Diagnosis Date   Anemia    history of anemai due to AUB   Anxiety    Chest pain of unknown etiology 2015   was seen in [facility] , [place]- not dx'd and no follow up   Depression    Hypertension    PONV (postoperative nausea and vomiting)     Past Surgical History:  Procedure Laterality Date   BIOPSY N/A 07/21/2016   Procedure: BIOPSY;  Surgeon: Midge Minium, MD;  Location: [facility];  Service: Endoscopy;  Laterality: N/A;  sigmoid polyps x2   CERVICAL CONIZATION W/BX N/A 04/30/2015   Procedure: CONIZATION CERVIX WITH BIOPSY;  Surgeon: Gerald Leitz, MD;  Location: [facility];  Service: Gynecology;  Laterality: N/A;   COLONOSCOPY WITH PROPOFOL N/A 07/21/2016   Procedure: COLONOSCOPY WITH PROPOFOL;  Surgeon: Midge Minium, MD;  Location: [facility];  Service: Endoscopy;  Laterality: N/A;   pyloric stenosis     TUBAL LIGATION      Prior to Admission medications   Medication Sig Start Date End Date Taking?  Authorizing Provider  benazepril (LOTENSIN) 40 MG tablet Take 1 tablet (40 mg total) by mouth daily. 12/01/22  Yes Cannady, Jolene T, NP  traMADol (ULTRAM) 50 MG tablet Take 1 tablet (50 mg total) by mouth every 6 (six) hours as needed for severe pain. 12/01/22  Yes Cannady, Jolene T, NP  traZODone (DESYREL) 50 MG tablet Take 0.5-1 tablets (25-50 mg total) by mouth at bedtime as needed for sleep. 12/01/22  Yes Cannady, Corrie Dandy T, NP  venlafaxine XR (EFFEXOR-XR) 75 MG 24 hr capsule Take 1 capsule (75 mg total) by mouth daily with breakfast. 12/01/22  Yes Cannady, Dorie Rank, NP    Family History  Adopted: Yes  Problem Relation Age of Onset   Breast cancer Neg Hx      Social History   Tobacco Use   Smoking status: Never   Smokeless tobacco: Never  Vaping Use   Vaping status: Never Used  Substance Use Topics   Alcohol use: Yes    Alcohol/week: 1.0 standard drink of alcohol    Types: 1 Glasses of wine per week    Comment: occasional   Drug use: No    Allergies as of 02/20/2023   (No Known Allergies)    Review of Systems:    All systems reviewed and negative except where noted in HPI.   Physical Exam:  BP 135/86 (BP Location: Left Arm, Patient  Position: Sitting, Cuff Size: Normal)   Pulse 71   Temp (!) 97.3 F (36.3 C) (Oral)   Ht 5' 7.3" (1.709 m)   Wt 180 lb 8 oz (81.9 kg)   BMI 28.02 kg/m  No LMP recorded. Patient is perimenopausal.  General:   Alert,  Well-developed, well-nourished, pleasant and cooperative in NAD Lungs:  Respirations even and unlabored.  Clear throughout to auscultation.   No wheezes, crackles, or rhonchi. No acute distress. Heart:  Regular rate and rhythm; no murmurs, clicks, rubs, or gallops. Abdomen:  Normal bowel sounds.  No bruits.  Soft, and non-distended without masses, hepatosplenomegaly or hernias noted.  No Tenderness.  No guarding or rebound tenderness.    Neurologic:  Alert and oriented x3;  grossly normal neurologically. Psych:  Alert and  cooperative. Normal mood and affect.  Imaging Studies: No results found.  Assessment and Plan:   Alexandra Le is a 52 y.o. y.o. y/o female has been referred for:   Hx adenomatous colon Polyps Scheduling Colonoscopy I discussed risks of colonoscopy with patient to include risk of bleeding, colon perforation, and risk of sedation.  Patient expressed understanding and agrees to proceed with colonoscopy.   Follow up As Needed.  Celso Amy, PA-C

## 2023-02-20 NOTE — Addendum Note (Signed)
Addended by: Adela Ports on: 02/20/2023 10:57 AM   Modules accepted: Orders

## 2023-02-23 ENCOUNTER — Telehealth: Payer: Self-pay

## 2023-02-23 NOTE — Telephone Encounter (Signed)
The patient called in to agree with her procedure date on 03/28/23.

## 2023-02-23 NOTE — Telephone Encounter (Signed)
Called the endoscopy unit to let them know that the patient would like her procedure to be done on 03/28/2023 with Dr. Servando Snare at [facility].

## 2023-02-23 NOTE — Telephone Encounter (Signed)
This patient is on Dr. Allegra Lai block for Dr. Servando Snare colonoscopy on 03/21/2023. It does not look like Dr. Servando Snare has spaced on that day for the colonoscopy. Can you please reschedule colonoscopy for patient last colonoscopy was with Dr. Servando Snare.

## 2023-02-23 NOTE — Telephone Encounter (Signed)
Called patient but had to leave a detailed message letting her know that we made a mistake in scheduling her the colonoscopy for 03/21/2023 instead of 03/28/2023. I asked her to call us back so we could figure out wht day is better for her. Patient needs a colonoscopy scheduled in [place] with Dr. Servando Snare.

## 2023-03-01 ENCOUNTER — Encounter: Payer: Self-pay | Admitting: Emergency Medicine

## 2023-03-01 ENCOUNTER — Emergency Department: Payer: BC Managed Care – PPO

## 2023-03-01 ENCOUNTER — Emergency Department
Admission: EM | Admit: 2023-03-01 | Discharge: 2023-03-01 | Disposition: A | Discharge status: Home / Self Care | Payer: BC Managed Care – PPO | Attending: Emergency Medicine | Admitting: Emergency Medicine

## 2023-03-01 ENCOUNTER — Other Ambulatory Visit: Payer: Self-pay

## 2023-03-01 DIAGNOSIS — S52611A Displaced fracture of right ulna styloid process, initial encounter for closed fracture: Secondary | ICD-10-CM | POA: Insufficient documentation

## 2023-03-01 DIAGNOSIS — S62101A Fracture of unspecified carpal bone, right wrist, initial encounter for closed fracture: Secondary | ICD-10-CM

## 2023-03-01 DIAGNOSIS — W010XXA Fall on same level from slipping, tripping and stumbling without subsequent striking against object, initial encounter: Secondary | ICD-10-CM | POA: Insufficient documentation

## 2023-03-01 DIAGNOSIS — S6991XA Unspecified injury of right wrist, hand and finger(s), initial encounter: Secondary | ICD-10-CM | POA: Diagnosis present

## 2023-03-01 NOTE — ED Triage Notes (Signed)
Pt presents after trip and fall this morning landing on right wrist.  Unable to full extend wrist d/t pain.

## 2023-03-01 NOTE — ED Provider Triage Note (Signed)
Emergency Medicine Provider Triage Evaluation Note  Alexandra Le , a 52 y.o. female  was evaluated in triage.  Pt complains of right hand and wrist pain. She tripped and fell over her cat earlier today.  Review of Systems  Positive: Right wrist and hand pain Negative: Numbness and tingling  Physical Exam  There were no vitals taken for this visit. Gen:   Awake, no distress   Resp:  Normal effort  MSK:   Moves extremities without difficulty  Other:  Right wrist is swollen when compared to the left, tender to palpation over the radius at the joint line, no overlying bruising or skin changes.  ROM limited due to pain, finger ROM intact.  Medical Decision Making  Medically screening exam initiated at 4:20 PM.  Appropriate orders placed.  Alexandra Le was informed that the remainder of the evaluation will be completed by another provider, this initial triage assessment does not replace that evaluation, and the importance of remaining in the ED until their evaluation is complete.   Alexandra Ali, PA-C 03/01/23 1622

## 2023-03-01 NOTE — ED Provider Notes (Signed)
[facility] Provider Note    Event Date/Time   First MD Initiated Contact with Patient 03/01/23 1808     (approximate)   History   Wrist Injury   HPI  Alexandra Le is a 52 y.o. female who presents today for evaluation of right wrist injury.  Patient reports that at approximately 10:30 AM this morning she tripped over her cat and fell onto her right outstretched hand.  She denies head strike or LOC.  She has not had any other injuries.  She reports that she has pain in her right wrist only.  No paresthesias.     Physical Exam   Triage Vital Signs: ED Triage Vitals  Encounter Vitals Group     BP 03/01/23 1620 (!) 153/106     Systolic BP Percentile --      Diastolic BP Percentile --      Pulse Rate 03/01/23 1620 97     Resp 03/01/23 1620 17     Temp 03/01/23 1620 99.2 F (37.3 C)     Temp Source 03/01/23 1620 Oral     SpO2 03/01/23 1620 98 %     Weight --      Height --      Head Circumference --      Peak Flow --      Pain Score 03/01/23 1623 0     Pain Loc --      Pain Education --      Exclude from Growth Chart --     Most recent vital signs: Vitals:   03/01/23 1620  BP: (!) 153/106  Pulse: 97  Resp: 17  Temp: 99.2 F (37.3 C)  SpO2: 98%    Physical Exam Vitals and nursing note reviewed.  Constitutional:      General: Awake and alert. No acute distress.    Appearance: Normal appearance. The patient is normal weight.  HENT:     Head: Normocephalic and atraumatic.     Mouth: Mucous membranes are moist.  Eyes:     General: PERRL. Normal EOMs        Right eye: No discharge.        Left eye: No discharge.     Conjunctiva/sclera: Conjunctivae normal.  Cardiovascular:     Rate and Rhythm: Normal rate and regular rhythm.     Pulses: Normal pulses.  Pulmonary:     Effort: Pulmonary effort is normal. No respiratory distress.     Breath sounds: Normal breath sounds.  Abdominal:     Abdomen is soft. There is no abdominal  tenderness. No rebound or guarding. No distention. Musculoskeletal:        General: No swelling. Normal range of motion.     Cervical back: Normal range of motion and neck supple.  Right wrist: No obvious deformity.  She has tenderness to palpation to the dorsum of her wrist and over her ulnar styloid.  No snuffbox tenderness.  Normal intrinsic muscle function of her hand.  Normal radial pulse.  No tenderness along her forearm, elbow, humerus, or shoulder.  Compartments are soft and compressible throughout.  There are no open wounds. Skin:    General: Skin is warm and dry.     Capillary Refill: Capillary refill takes less than 2 seconds.     Findings: No rash.  Neurological:     Mental Status: The patient is awake and alert.      ED Results / Procedures / Treatments   Labs (  all labs ordered are listed, but only abnormal results are displayed) Labs Reviewed - No data to display   EKG     RADIOLOGY I independently reviewed and interpreted imaging and agree with radiologists findings.     PROCEDURES:  Critical Care performed:   Procedures   MEDICATIONS ORDERED IN ED: Medications - No data to display   IMPRESSION / MDM / ASSESSMENT AND PLAN / ED COURSE  I reviewed the triage vital signs and the nursing notes.   Differential diagnosis includes, but is not limited to, fracture, contusion, sprain.  Patient is awake and alert, hemodynamically stable and neurovascularly intact.  She is tearful, though reports that she feels silly and is not in pain and that is why she is tearful.  There is no obvious deformity.  She has normal radial pulse.  There are no open wounds.  X-ray obtained reveals a minimally displaced fracture of the ulnar styloid process and a probable subtle impacted fracture of the distal right radial metaphysis.  I recommended splint with Ortho-Glass, however patient declines, reports that she wants a removable splint only.  This was provided for her, though  we discussed the limitations of this type of splint.  Discussed the importance of very close outpatient follow-up with orthopedics and the appropriate follow-up information was provided.  Recommended rest, ice, elevation.  Patient was offered analgesia but declined.  There is no signs or symptoms of compartment syndrome at this time.  We discussed return precautions the importance of close outpatient follow-up.  Patient understands and agrees with plan.  She was discharged in stable condition.   Patient's presentation is most consistent with acute complicated illness / injury requiring diagnostic workup.    FINAL CLINICAL IMPRESSION(S) / ED DIAGNOSES   Final diagnoses:  Closed fracture of right wrist, initial encounter     Rx / DC Orders   ED Discharge Orders     None        Note:  This document was prepared using Dragon voice recognition software and may include unintentional dictation errors.   Keturah Shavers 03/01/23 Jacalyn Lefevre, MD 03/02/23 [phone]

## 2023-03-01 NOTE — Discharge Instructions (Signed)
Please follow-up with orthopedics regarding your wrist fracture.  Please keep your wrist elevated.  Please keep your splint clean and dry.  Please return for any new, worsening, or change in symptoms or other concerns.  It was a pleasure caring for you today.

## 2023-03-02 ENCOUNTER — Ambulatory Visit: Payer: Self-pay

## 2023-03-02 NOTE — Telephone Encounter (Signed)
Chief Complaint: Wrist pain  Symptoms: pain and swelling Frequency: constant Pertinent Negatives: Patient denies all other symptoms Disposition: [] ED /[] Urgent Care (no appt availability in office) / [] Appointment(In office/virtual)/ []  [facility]/ [] Home Care/ [] Refused Recommended Disposition /[] [facility]/ [x]  Follow-up with PCP Additional Notes: Patient states she tripped over her cat yesterday and fell landing on her hands. Patient went to the ED and was diagnosed with a fractured wrist. Patient currently wearing a soft brace. Patient states her pain is close to a 10/10 on the pain scale and she has tried tylenol without relief. Patient states she was not given a prescription for pain medication at the ED and continues to have pain and swelling at the injury site. Care advice given and patient requested additional recommendations from PCP. Advised I would forward request to PCP at this time. If provider is will to send an Rx for pain to the pharmacy patient would like it to go to [facility] on [address] In [place].   Summary: fracture wrist right   Pt said she went to the ER last night for a fx wrist.  She said they did not give her anything for pain.  She has not taken anything OTC.  She is asking for pain med.  CB@  [phone]     Reason for Disposition  Large swelling or bruise (> 2 inches or 5 cm)  Answer Assessment - Initial Assessment Questions 1. MECHANISM: "How did the injury happen?"     I tripped over my cat and tried to brace my fall with my hands 2. ONSET: "When did the injury happen?" (Minutes or hours ago)      Yesterday  3. APPEARANCE of INJURY: "What does the injury look like?"      The wrist is fractured I went to the ED  4. SEVERITY: "Can you use the hand normally?" "Can you bend your fingers into a ball and then fully open them?"     No  5. SIZE: For cuts, bruises, or swelling, ask: "How large is it?" (e.g., inches or centimeters;  entire  hand or wrist)      No  6. PAIN: "Is there pain?" If Yes, ask: "How bad is the pain?"  (Scale 1-10; or mild, moderate, severe)     9/10 8. OTHER SYMPTOMS: "Do you have any other symptoms?"      None  Protocols used: Hand and Wrist Injury-A-AH

## 2023-03-02 NOTE — Telephone Encounter (Signed)
Called and notified patient of providers message. Patient verbalized understanding.  

## 2023-03-12 ENCOUNTER — Ambulatory Visit: Payer: BC Managed Care – PPO | Admitting: Gastroenterology

## 2023-03-21 ENCOUNTER — Encounter: Payer: Self-pay | Admitting: Gastroenterology

## 2023-03-27 ENCOUNTER — Encounter: Payer: Self-pay | Admitting: Gastroenterology

## 2023-03-28 ENCOUNTER — Encounter
Admission: RE | Disposition: A | Discharge status: Home / Self Care | Payer: Self-pay | Source: Home / Self Care | Attending: Gastroenterology

## 2023-03-28 ENCOUNTER — Other Ambulatory Visit: Payer: Self-pay

## 2023-03-28 ENCOUNTER — Encounter: Payer: Self-pay | Admitting: Gastroenterology

## 2023-03-28 ENCOUNTER — Ambulatory Visit: Payer: BC Managed Care – PPO | Admitting: General Practice

## 2023-03-28 ENCOUNTER — Ambulatory Visit
Admission: RE | Admit: 2023-03-28 | Discharge: 2023-03-28 | Disposition: A | Discharge status: Home / Self Care | Payer: BC Managed Care – PPO | Attending: Gastroenterology | Admitting: Gastroenterology

## 2023-03-28 DIAGNOSIS — Z1211 Encounter for screening for malignant neoplasm of colon: Secondary | ICD-10-CM | POA: Insufficient documentation

## 2023-03-28 DIAGNOSIS — D125 Benign neoplasm of sigmoid colon: Secondary | ICD-10-CM | POA: Insufficient documentation

## 2023-03-28 DIAGNOSIS — K635 Polyp of colon: Secondary | ICD-10-CM | POA: Diagnosis not present

## 2023-03-28 DIAGNOSIS — K640 First degree hemorrhoids: Secondary | ICD-10-CM | POA: Diagnosis not present

## 2023-03-28 DIAGNOSIS — I10 Essential (primary) hypertension: Secondary | ICD-10-CM | POA: Insufficient documentation

## 2023-03-28 DIAGNOSIS — Z860100 Personal history of colon polyps, unspecified: Secondary | ICD-10-CM | POA: Diagnosis not present

## 2023-03-28 HISTORY — PX: COLONOSCOPY WITH PROPOFOL: SHX5780

## 2023-03-28 HISTORY — PX: POLYPECTOMY: SHX5525

## 2023-03-28 SURGERY — COLONOSCOPY WITH PROPOFOL
Anesthesia: General

## 2023-03-28 MED ORDER — LIDOCAINE HCL (CARDIAC) PF 100 MG/5ML IV SOSY
PREFILLED_SYRINGE | INTRAVENOUS | Status: DC | PRN
  Administered 2023-03-28: 50 mg via INTRAVENOUS

## 2023-03-28 MED ORDER — SODIUM CHLORIDE 0.9 % IV SOLN: INTRAVENOUS | Status: DC

## 2023-03-28 MED ORDER — PROPOFOL 10 MG/ML IV BOLUS
INTRAVENOUS | Status: DC | PRN
Start: 2023-03-28 — End: 2023-03-28
  Administered 2023-03-28: 130 mg via INTRAVENOUS
  Administered 2023-03-28: 150 mg via INTRAVENOUS
  Administered 2023-03-28: 100 mg via INTRAVENOUS

## 2023-03-28 MED ORDER — DEXMEDETOMIDINE HCL IN NACL 80 MCG/20ML IV SOLN
INTRAVENOUS | Status: DC | PRN
  Administered 2023-03-28 (×2): 8 ug via INTRAVENOUS
  Administered 2023-03-28: 4 ug via INTRAVENOUS
  Administered 2023-03-28: 8 ug via INTRAVENOUS

## 2023-03-28 NOTE — Transfer of Care (Signed)
Immediate Anesthesia Transfer of Care Note  Patient: Alexandra Le  Procedure(s) Performed: COLONOSCOPY WITH PROPOFOL POLYPECTOMY  Patient Location: PACU and Endoscopy Unit  Anesthesia Type:General  Level of Consciousness: drowsy and patient cooperative  Airway & Oxygen Therapy: Patient Spontanous Breathing  Post-op Assessment: Report given to RN and Post -op Vital signs reviewed and stable  Post vital signs: Reviewed and stable  Last Vitals:  Vitals Value Taken Time  BP 103/68 03/28/23 0841  Temp    Pulse 75 03/28/23 0842  Resp 16 03/28/23 0841  SpO2 96 % 03/28/23 0842  Vitals shown include unfiled device data.  Last Pain:  Vitals:   03/28/23 0841  TempSrc:   PainSc: 0-No pain         Complications: No notable events documented.

## 2023-03-28 NOTE — Anesthesia Preprocedure Evaluation (Signed)
Anesthesia Evaluation  Patient identified by MRN, date of birth, ID band Patient awake    Reviewed: Allergy & Precautions, NPO status , Patient's Chart, lab work & pertinent test results  Airway Mallampati: III  TM Distance: >3 FB Neck ROM: full    Dental  (+) Dental Advidsory Given   Pulmonary neg pulmonary ROS   Pulmonary exam normal        Cardiovascular hypertension, negative cardio ROS Normal cardiovascular exam     Neuro/Psych  PSYCHIATRIC DISORDERS Anxiety Depression    negative neurological ROS     GI/Hepatic negative GI ROS, Neg liver ROS,,,  Endo/Other  negative endocrine ROS    Renal/GU negative Renal ROS  negative genitourinary   Musculoskeletal   Abdominal   Peds  Hematology negative hematology ROS (+)   Anesthesia Other Findings Past Medical History: No date: Anemia     Comment:  history of anemai due to AUB No date: Anxiety 2015: Chest pain of unknown etiology     Comment:  was seen in [facility], [place], [place]- not dx'd and no               follow up No date: Depression No date: Hypertension No date: PONV (postoperative nausea and vomiting)  Past Surgical History: 07/21/2016: BIOPSY; N/A     Comment:  Procedure: BIOPSY;  Surgeon: Midge Minium, MD;  Location:              [facility];  Service: Endoscopy;  Laterality:               N/A;  sigmoid polyps x2 04/30/2015: CERVICAL CONIZATION W/BX; N/A     Comment:  Procedure: CONIZATION CERVIX WITH BIOPSY;  Surgeon: Gerald Leitz, MD;  Location: [facility];  Service: Gynecology;                Laterality: N/A; 07/21/2016: COLONOSCOPY WITH PROPOFOL; N/A     Comment:  Procedure: COLONOSCOPY WITH PROPOFOL;  Surgeon: Midge Minium, MD;  Location: [facility];  Service:               Endoscopy;  Laterality: N/A; No date: pyloric stenosis No date: TUBAL LIGATION  BMI    Body Mass Index: 28.41 kg/m       Reproductive/Obstetrics negative OB ROS                             Anesthesia Physical Anesthesia Plan  ASA: 2  Anesthesia Plan: General   Post-op Pain Management: Minimal or no pain anticipated   Induction: Intravenous  PONV Risk Score and Plan: 3 and Propofol infusion, TIVA and Ondansetron  Airway Management Planned: Nasal Cannula  Additional Equipment: None  Intra-op Plan:   Post-operative Plan:   Informed Consent: I have reviewed the patients History and Physical, chart, labs and discussed the procedure including the risks, benefits and alternatives for the proposed anesthesia with the patient or authorized representative who has indicated his/her understanding and acceptance.     Dental advisory given  Plan Discussed with: CRNA and Surgeon  Anesthesia Plan Comments: (Discussed risks of anesthesia with patient, including possibility of difficulty with spontaneous ventilation under anesthesia necessitating airway intervention, PONV, and rare risks such as cardiac or respiratory or neurological events, and allergic reactions. Discussed the role  of CRNA in patient's perioperative care. Patient understands.)       Anesthesia Quick Evaluation

## 2023-03-28 NOTE — H&P (Signed)
Midge Minium, MD [facility] [address], [address] [place], [place] [address] Phone:[phone] Fax : [phone]  Primary Care Physician:  Marjie Skiff, NP Primary Gastroenterologist:  Dr. Servando Snare  Pre-Procedure History & Physical: HPI:  Alexandra Le is a 52 y.o. female is here for an colonoscopy.   Past Medical History:  Diagnosis Date   Anemia    history of anemai due to AUB   Anxiety    Chest pain of unknown etiology 2015   was seen in [facility] [place] [place]- not dx'd and no follow up   Depression    Hypertension    PONV (postoperative nausea and vomiting)     Past Surgical History:  Procedure Laterality Date   BIOPSY N/A 07/21/2016   Procedure: BIOPSY;  Surgeon: Midge Minium, MD;  Location: [facility];  Service: Endoscopy;  Laterality: N/A;  sigmoid polyps x2   CERVICAL CONIZATION W/BX N/A 04/30/2015   Procedure: CONIZATION CERVIX WITH BIOPSY;  Surgeon: Gerald Leitz, MD;  Location: [facility];  Service: Gynecology;  Laterality: N/A;   COLONOSCOPY WITH PROPOFOL N/A 07/21/2016   Procedure: COLONOSCOPY WITH PROPOFOL;  Surgeon: Midge Minium, MD;  Location: [facility];  Service: Endoscopy;  Laterality: N/A;   pyloric stenosis     TUBAL LIGATION      Prior to Admission medications   Medication Sig Start Date End Date Taking? Authorizing Provider  benazepril (LOTENSIN) 40 MG tablet Take 1 tablet (40 mg total) by mouth daily. 12/01/22  Yes Cannady, Jolene T, NP  traZODone (DESYREL) 50 MG tablet Take 0.5-1 tablets (25-50 mg total) by mouth at bedtime as needed for sleep. 12/01/22  Yes Cannady, Corrie Dandy T, NP  venlafaxine XR (EFFEXOR-XR) 75 MG 24 hr capsule Take 1 capsule (75 mg total) by mouth daily with breakfast. 12/01/22  Yes Cannady, Jolene T, NP  traMADol (ULTRAM) 50 MG tablet Take 1 tablet (50 mg total) by mouth every 6 (six) hours as needed for severe pain. 12/01/22   Aura Dials T, NP    Allergies as of 02/20/2023   (No Known Allergies)    Family  History  Adopted: Yes  Problem Relation Age of Onset   Breast cancer Neg Hx     Social History   Socioeconomic History   Marital status: Married    Spouse name: Not on file   Number of children: Not on file   Years of education: Not on file   Highest education level: Some college, no degree  Occupational History   Not on file  Tobacco Use   Smoking status: Never   Smokeless tobacco: Never  Vaping Use   Vaping status: Never Used  Substance and Sexual Activity   Alcohol use: Yes    Alcohol/week: 1.0 standard drink of alcohol    Types: 1 Glasses of wine per week    Comment: occasional   Drug use: No   Sexual activity: Yes  Other Topics Concern   Not on file  Social History Narrative   Not on file   Social Determinants of Health   Financial Resource Strain: Low Risk  (08/02/2022)   Overall Financial Resource Strain (CARDIA)    Difficulty of Paying Living Expenses: Not very hard  Food Insecurity: No Food Insecurity (08/02/2022)   Hunger Vital Sign    Worried About Running Out of Food in the Last Year: Never true    Ran Out of Food in the Last Year: Never true  Transportation Needs: No Transportation Needs (08/02/2022)  PRAPARE - Administrator, Civil Service (Medical): No    Lack of Transportation (Non-Medical): No  Physical Activity: Insufficiently Active (08/02/2022)   Exercise Vital Sign    Days of Exercise per Week: 3 days    Minutes of Exercise per Session: 40 min  Stress: Stress Concern Present (08/02/2022)   [employer] of Occupational Health - Occupational Stress Questionnaire    Feeling of Stress : To some extent  Social Connections: Socially Integrated (08/02/2022)   Social Connection and Isolation Panel [NHANES]    Frequency of Communication with Friends and Family: More than three times a week    Frequency of Social Gatherings with Friends and Family: Once a week    Attends Religious Services: More than 4 times per year    Active Member of  [employer] or Organizations: Yes    Attends Engineer, structural: More than 4 times per year    Marital Status: Married  Catering manager Violence: Not on file    Review of Systems: See HPI, otherwise negative ROS  Physical Exam: BP (!) 160/91   Pulse 69   Temp 97.6 F (36.4 C) (Temporal)   Wt 83 kg   SpO2 97%   BMI 28.41 kg/m  General:   Alert,  pleasant and cooperative in NAD Head:  Normocephalic and atraumatic. Neck:  Supple; no masses or thyromegaly. Lungs:  Clear throughout to auscultation.    Heart:  Regular rate and rhythm. Abdomen:  Soft, nontender and nondistended. Normal bowel sounds, without guarding, and without rebound.   Neurologic:  Alert and  oriented x4;  grossly normal neurologically.  Impression/Plan: Alexandra Le is here for an colonoscopy to be performed for a history of adenomatous polyps on 2018   Risks, benefits, limitations, and alternatives regarding  colonoscopy have been reviewed with the patient.  Questions have been answered.  All parties agreeable.   Midge Minium, MD  03/28/2023, 7:47 AM

## 2023-03-28 NOTE — Op Note (Signed)
[facility]astroenterology Patient Name: Alexandra Le Procedure Date: 03/28/2023 8:14 AM MRN: [id number] Account #: [id number] Date of Birth: [birth year] Admit Type: Outpatient Age: 52 Room: [facility] ENDO ROOM 4 Gender: Female Note Status: Finalized Instrument Name: Prentice Docker [id number] Procedure:             Colonoscopy Indications:           High risk colon cancer surveillance: Personal history                         of colonic polyps Providers:             Midge Minium MD, MD Referring MD:          Dorie Rank. Cannady (Referring MD) Medicines:             Propofol per Anesthesia Complications:         No immediate complications. Procedure:             Pre-Anesthesia Assessment:                        - Prior to the procedure, a History and Physical was                         performed, and patient medications and allergies were                         reviewed. The patient's tolerance of previous                         anesthesia was also reviewed. The risks and benefits                         of the procedure and the sedation options and risks                         were discussed with the patient. All questions were                         answered, and informed consent was obtained. Prior                         Anticoagulants: The patient has taken no anticoagulant                         or antiplatelet agents. ASA Grade Assessment: II - A                         patient with mild systemic disease. After reviewing                         the risks and benefits, the patient was deemed in                         satisfactory condition to undergo the procedure.                        After obtaining informed consent, the colonoscope was  passed under direct vision. Throughout the procedure,                         the patient's blood pressure, pulse, and oxygen                         saturations were monitored continuously. The                          Colonoscope was introduced through the anus and                         advanced to the the cecum, identified by appendiceal                         orifice and ileocecal valve. The colonoscopy was                         performed without difficulty. The patient tolerated                         the procedure well. The quality of the bowel                         preparation was excellent. Findings:      The perianal and digital rectal examinations were normal.      Four sessile polyps were found in the sigmoid colon. The polyps were 3       to 4 mm in size. These polyps were removed with a cold snare. Resection       and retrieval were complete.      Non-bleeding internal hemorrhoids were found during retroflexion. The       hemorrhoids were Grade I (internal hemorrhoids that do not prolapse). Impression:            - Four 3 to 4 mm polyps in the sigmoid colon, removed                         with a cold snare. Resected and retrieved.                        - Non-bleeding internal hemorrhoids. Recommendation:        - Discharge patient to home.                        - Resume previous diet.                        - Continue present medications.                        - Await pathology results.                        - Repeat colonoscopy in 5 years for surveillance. Procedure Code(s):     --- Professional ---                        [phone], Colonoscopy, flexible; with removal of  tumor(s), polyp(s), or other lesion(s) by snare                         technique Diagnosis Code(s):     --- Professional ---                        Z86.010, Personal history of colonic polyps                        D12.5, Benign neoplasm of sigmoid colon CPT copyright 2022 American Medical Association. All rights reserved. The codes documented in this report are preliminary and upon coder review may  be revised to meet current compliance requirements. Midge Minium MD,  MD 03/28/2023 8:37:26 AM This report has been signed electronically. Number of Addenda: 0 Note Initiated On: 03/28/2023 8:14 AM Scope Withdrawal Time: 0 hours 8 minutes 54 seconds  Total Procedure Duration: 0 hours 15 minutes 3 seconds  Estimated Blood Loss:  Estimated blood loss: none.      [facility]

## 2023-03-28 NOTE — Anesthesia Postprocedure Evaluation (Signed)
Anesthesia Post Note  Patient: Alexandra Le  Procedure(s) Performed: COLONOSCOPY WITH PROPOFOL POLYPECTOMY  Patient location during evaluation: PACU Anesthesia Type: General Level of consciousness: awake and alert Pain management: pain level controlled Vital Signs Assessment: post-procedure vital signs reviewed and stable Respiratory status: spontaneous breathing, nonlabored ventilation, respiratory function stable and patient connected to nasal cannula oxygen Cardiovascular status: blood pressure returned to baseline and stable Postop Assessment: no apparent nausea or vomiting Anesthetic complications: no  No notable events documented.   Last Vitals:  Vitals:   03/28/23 0851 03/28/23 0901  BP: 109/76 128/74  Pulse: (!) 59   Resp:    Temp:    SpO2: 95% 96%    Last Pain:  Vitals:   03/28/23 0901  TempSrc:   PainSc: 0-No pain                 Stephanie Coup

## 2023-03-29 ENCOUNTER — Encounter: Payer: Self-pay | Admitting: Gastroenterology

## 2023-03-30 LAB — SURGICAL PATHOLOGY

## 2023-03-31 ENCOUNTER — Encounter: Payer: Self-pay | Admitting: Gastroenterology

## 2023-08-13 ENCOUNTER — Other Ambulatory Visit: Payer: Self-pay | Admitting: Obstetrics and Gynecology

## 2023-08-13 DIAGNOSIS — Z1231 Encounter for screening mammogram for malignant neoplasm of breast: Secondary | ICD-10-CM

## 2023-08-19 DIAGNOSIS — Z8781 Personal history of (healed) traumatic fracture: Secondary | ICD-10-CM | POA: Insufficient documentation

## 2023-08-19 NOTE — Patient Instructions (Signed)
 Be Involved in Caring For Your Health:  Taking Medications When medications are taken as directed, they can greatly improve your health. But if they are not taken as prescribed, they may not work. In some cases, not taking them correctly can be harmful. To help ensure your treatment remains effective and safe, understand your medications and how to take them. Bring your medications to each visit for review by your provider.  Your lab results, notes, and after visit summary will be available on My Chart. We strongly encourage you to use this feature. If lab results are abnormal the clinic will contact you with the appropriate steps. If the clinic does not contact you assume the results are satisfactory. You can always view your results on My Chart. If you have questions regarding your health or results, please contact the clinic during office hours. You can also ask questions on My Chart.  We at [facility] are grateful that you chose [place] to provide your care. We strive to provide evidence-based and compassionate care and are always looking for feedback. If you get a survey from the clinic please complete this so we can hear your opinions.  Managing Depression, Adult Depression is a mental health condition that affects your thoughts, feelings, and actions. Being diagnosed with depression can bring you relief if you did not know why you have felt or behaved a certain way. It could also leave you feeling overwhelmed. Finding ways to manage your symptoms can help you feel more positive about your future. How to manage lifestyle changes Being depressed is difficult. Depression can increase the level of everyday stress. Stress can make depression symptoms worse. You may believe your symptoms cannot be managed or will never improve. However, there are many things you can try to help manage your symptoms. There is hope. Managing stress  Stress is your body's reaction to life changes and events,  both good and bad. Stress can add to your feelings of depression. Learning to manage your stress can help lessen your feelings of depression. Try some of the following approaches to reducing your stress (stress reduction techniques): Listen to music that you enjoy and that inspires you. Try using a meditation app or take a meditation class. Develop a practice that helps you connect with your spiritual self. Walk in nature, pray, or go to a place of worship. Practice deep breathing. To do this, inhale slowly through your nose. Pause at the top of your inhale for a few seconds and then exhale slowly, letting yourself relax. Repeat this three or four times. Practice yoga to help relax and work your muscles. Choose a stress reduction technique that works for you. These techniques take time and practice to develop. Set aside 5-15 minutes a day to do them. Therapists can offer training in these techniques. Do these things to help manage stress: Keep a journal. Know your limits. Set healthy boundaries for yourself and others, such as saying "no" when you think something is too much. Pay attention to how you react to certain situations. You may not be able to control everything, but you can change your reaction. Add humor to your life by watching funny movies or shows. Make time for activities that you enjoy and that relax you. Spend less time using electronics, especially at night before bed. The light from screens can make your brain think it is time to get up rather than go to bed.  Medicines Medicines, such as antidepressants, are often a part of  treatment for depression. Talk with your pharmacist or health care provider about all the medicines, supplements, and herbal products that you take, their possible side effects, and what medicines and other products are safe to take together. Make sure to report any side effects you may have to your health care provider. Relationships Your health care  provider may suggest family therapy, couples therapy, or individual therapy as part of your treatment. How to recognize changes Everyone responds differently to treatment for depression. As you recover from depression, you may start to: Have more interest in doing activities. Feel more hopeful. Have more energy. Eat a more regular amount of food. Have better mental focus. It is important to recognize if your depression is not getting better or is getting worse. The symptoms you had in the beginning may return, such as: Feeling tired. Eating too much or too little. Sleeping too much or too little. Feeling restless, agitated, or hopeless. Trouble focusing or making decisions. Having unexplained aches and pains. Feeling irritable, angry, or aggressive. If you or your family members notice these symptoms coming back, let your health care provider know right away. Follow these instructions at home: Activity Try to get some form of exercise each day, such as walking. Try yoga, mindfulness, or other stress reduction techniques. Participate in group activities if you are able. Lifestyle Get enough sleep. Cut down on or stop using caffeine, tobacco, alcohol, and any other harmful substances. Eat a healthy diet that includes plenty of vegetables, fruits, whole grains, low-fat dairy products, and lean protein. Limit foods that are high in solid fats, added sugar, or salt (sodium). General instructions Take over-the-counter and prescription medicines only as told by your health care provider. Keep all follow-up visits. It is important for your health care provider to check on your mood, behavior, and medicines. Your health care provider may need to make changes to your treatment. Where to find support Talking to others  Friends and family members can be sources of support and guidance. Talk to trusted friends or family members about your condition. Explain your symptoms and let them know that you  are working with a health care provider to treat your depression. Tell friends and family how they can help. Finances Find mental health providers that fit with your financial situation. Talk with your health care provider if you are worried about access to food, housing, or medicine. Call your insurance company to learn about your co-pays and prescription plan. Where to find more information You can find support in your area from: Anxiety and Depression Association of America (ADAA): adaa.org Mental Health America: mentalhealthamerica.net The [employer] on Mental Illness: nami.org Contact a health care provider if: You stop taking your antidepressant medicines, and you have any of these symptoms: Nausea. Headache. Light-headedness. Chills and body aches. Not being able to sleep (insomnia). You or your friends and family think your depression is getting worse. Get help right away if: You have thoughts of hurting yourself or others. Get help right away if you feel like you may hurt yourself or others, or have thoughts about taking your own life. Go to your nearest emergency room or: Call 911. Call the National Suicide Prevention Lifeline at 315-804-2514 or 988. This is open 24 hours a day. Text the Crisis Text Line at 902-341-0291. This information is not intended to replace advice given to you by your health care provider. Make sure you discuss any questions you have with your health care provider. Document Revised: 08/30/2021 Document Reviewed:  08/30/2021 Elsevier Patient Education  2024 [employer].

## 2023-08-22 ENCOUNTER — Encounter: Payer: Self-pay | Admitting: Nurse Practitioner

## 2023-08-22 ENCOUNTER — Ambulatory Visit (INDEPENDENT_AMBULATORY_CARE_PROVIDER_SITE_OTHER): Admitting: Nurse Practitioner

## 2023-08-22 VITALS — BP 134/86 | HR 76 | Temp 98.0°F | Ht 68.3 in | Wt 191.6 lb

## 2023-08-22 DIAGNOSIS — Z124 Encounter for screening for malignant neoplasm of cervix: Secondary | ICD-10-CM

## 2023-08-22 DIAGNOSIS — I10 Essential (primary) hypertension: Secondary | ICD-10-CM

## 2023-08-22 DIAGNOSIS — N1831 Chronic kidney disease, stage 3a: Secondary | ICD-10-CM | POA: Diagnosis not present

## 2023-08-22 DIAGNOSIS — E559 Vitamin D deficiency, unspecified: Secondary | ICD-10-CM

## 2023-08-22 DIAGNOSIS — F411 Generalized anxiety disorder: Secondary | ICD-10-CM

## 2023-08-22 DIAGNOSIS — Z6827 Body mass index (BMI) 27.0-27.9, adult: Secondary | ICD-10-CM

## 2023-08-22 DIAGNOSIS — Z0000 Encounter for general adult medical examination without abnormal findings: Secondary | ICD-10-CM

## 2023-08-22 DIAGNOSIS — F332 Major depressive disorder, recurrent severe without psychotic features: Secondary | ICD-10-CM | POA: Diagnosis not present

## 2023-08-22 DIAGNOSIS — E7800 Pure hypercholesterolemia, unspecified: Secondary | ICD-10-CM | POA: Diagnosis not present

## 2023-08-22 DIAGNOSIS — F1090 Alcohol use, unspecified, uncomplicated: Secondary | ICD-10-CM | POA: Insufficient documentation

## 2023-08-22 DIAGNOSIS — K760 Fatty (change of) liver, not elsewhere classified: Secondary | ICD-10-CM

## 2023-08-22 DIAGNOSIS — F5101 Primary insomnia: Secondary | ICD-10-CM

## 2023-08-22 LAB — MICROALBUMIN, URINE WAIVED
Creatinine, Urine Waived: 100 mg/dL (ref 10–300)
Microalb, Ur Waived: 30 mg/L — ABNORMAL HIGH (ref 0–19)
Microalb/Creat Ratio: <30 mg/g (ref <30)

## 2023-08-22 MED ORDER — BUPROPION HCL ER (XL) 150 MG PO TB24: 150.0000 mg | ORAL_TABLET | Freq: Every day | ORAL | 4 refills | Status: DC

## 2023-08-22 NOTE — Progress Notes (Signed)
 BP 134/86 (BP Location: Left Arm, Patient Position: Sitting)   Pulse 76   Temp 98 F (36.7 C) (Oral)   Ht 5' 8.3" (1.735 m)   Wt 191 lb 9.6 oz (86.9 kg)   SpO2 98%   BMI 28.88 kg/m    Subjective:    Patient ID: Alexandra Le, female    DOB: January 05, 1971, 53 y.o.   MRN: [id number]  HPI: Alexandra Le is a 53 y.o. female presenting on 08/22/2023 for comprehensive medical examination. Current medical complaints include: increase alcohol intake  She currently lives with: husband and grandchildren Menopausal Symptoms: no  HYPERTENSION with Chronic Kidney Disease Continues on Benazepril.  No statin therapy.  Has not taken medication this morning.  Does miss doses at baseline twice a week. Hypertension status: exacerbated  Satisfied with current treatment? yes Duration of hypertension: chronic BP monitoring frequency:  weekly BP range:  BP medication side effects:  no Medication compliance: good compliance Aspirin: no Recurrent headaches: no Visual changes: no Palpitations: no Dyspnea: no Chest pain: no Lower extremity edema: no Dizzy/lightheaded: no  The 10-year ASCVD risk score (Arnett DK, et al., 2019) is: 2.3%   Values used to calculate the score:     Age: 72 years     Sex: Female     Is Non-Hispanic African American: No     Diabetic: No     Tobacco smoker: No     Systolic Blood Pressure: 134 mmHg     Is BP treated: Yes     HDL Cholesterol: 61 mg/dL     Total Cholesterol: 195 mg/dL  DEPRESSION/ANXIETY Continues on Effexor XR 75 MG.   Did take Wellbutrin in past which offered benefit, stopped as son was worried about her taking this due to information he read.  She has returned to alcohol use due to stressors at work.  Does not drink every day, maybe 2-3 times a week without husband knowing -- 2 beers each time.  He used to be an alcoholic and changed life around, does not want her to drink. She was adopted, unsure of parent history.  Lots of stressors  at present with raising two grandchildren and work stress.   Mood status: uncontrolled Satisfied with current treatment?: yes Symptom severity: moderate  Duration of current treatment : chronic Side effects: no Medication compliance: good compliance Psychotherapy/counseling: yes in the past Depressed mood: yes quite a few breakdowns Anxious mood: yes Anhedonia: yes Significant weight loss or gain: no Insomnia: yes hard to fall asleep Fatigue: yes Feelings of worthlessness or guilt: occasional Impaired concentration/indecisiveness: yes Suicidal ideations: no Hopelessness: no Crying spells: yes    08/22/2023    8:59 AM 12/01/2022    9:17 AM 08/04/2022   10:50 AM 04/11/2022   11:42 AM 12/30/2021    1:46 PM  Depression screen PHQ 2/9  Decreased Interest 2 0 0 0 0  Down, Depressed, Hopeless 2 0 0 0 0  PHQ - 2 Score 4 0 0 0 0  Altered sleeping 3 3 1 2 3   Tired, decreased energy 3 2 1 1 1   Change in appetite 2 0 1 0 0  Feeling bad or failure about yourself  1 0 0 0 0  Trouble concentrating 2 1 1 1 1   Moving slowly or fidgety/restless 1 1 1  0 0  Suicidal thoughts 0 0 0 0 0  PHQ-9 Score 16 7 5 4 5   Difficult doing work/chores Very difficult Somewhat difficult Not difficult at all  Not difficult at all Not difficult at all       08/22/2023    8:59 AM 12/01/2022    9:17 AM 08/04/2022   10:50 AM 04/11/2022   11:43 AM  GAD 7 : Generalized Anxiety Score  Nervous, Anxious, on Edge 1 0 0 0  Control/stop worrying 2 0 0 0  Worry too much - different things 2 0 0 0  Trouble relaxing 2 1 0 1  Restless 1 1 0 0  Easily annoyed or irritable 3 1 1  0  Afraid - awful might happen 0 0 0 0  Total GAD 7 Score 11 3 1 1   Anxiety Difficulty Very difficult Not difficult at all Not difficult at all Not difficult at all      04/11/2022   11:42 AM 04/15/2022    4:11 PM 08/04/2022   10:50 AM 12/01/2022    9:16 AM 08/22/2023    8:59 AM  Fall Risk  Falls in the past year? 1  0 0 1  Was there an injury  with Fall? 0  0 0 0  Fall Risk Category Calculator 2  0 0 1  Fall Risk Category (Retired) Moderate      (RETIRED) Patient Fall Risk Level  Low fall risk     Patient at Risk for Falls Due to History of fall(s)  No Fall Risks No Fall Risks No Fall Risks  Fall risk Follow up Falls evaluation completed  Falls evaluation completed Falls evaluation completed Falls evaluation completed    Past Medical History:  Past Medical History:  Diagnosis Date   Anemia    history of anemai due to AUB   Anxiety    Chest pain of unknown etiology 2015   was seen in [facility] [place]- not dx'd and no follow up   Depression    Hypertension    PONV (postoperative nausea and vomiting)     Surgical History:  Past Surgical History:  Procedure Laterality Date   BIOPSY N/A 07/21/2016   Procedure: BIOPSY;  Surgeon: Midge Minium, MD;  Location: [facility];  Service: Endoscopy;  Laterality: N/A;  sigmoid polyps x2   CERVICAL CONIZATION W/BX N/A 04/30/2015   Procedure: CONIZATION CERVIX WITH BIOPSY;  Surgeon: Gerald Leitz, MD;  Location: [facility];  Service: Gynecology;  Laterality: N/A;   COLONOSCOPY WITH PROPOFOL N/A 07/21/2016   Procedure: COLONOSCOPY WITH PROPOFOL;  Surgeon: Midge Minium, MD;  Location: [facility];  Service: Endoscopy;  Laterality: N/A;   COLONOSCOPY WITH PROPOFOL N/A 03/28/2023   Procedure: COLONOSCOPY WITH PROPOFOL;  Surgeon: Midge Minium, MD;  Location: [facility];  Service: Endoscopy;  Laterality: N/A;   POLYPECTOMY  03/28/2023   Procedure: POLYPECTOMY;  Surgeon: Midge Minium, MD;  Location: [facility];  Service: Endoscopy;;   pyloric stenosis     TUBAL LIGATION      Medications:  Current Outpatient Medications on File Prior to Visit  Medication Sig   benazepril (LOTENSIN) 40 MG tablet Take 1 tablet (40 mg total) by mouth daily.   traMADol (ULTRAM) 50 MG tablet Take 1 tablet (50 mg total) by mouth every 6 (six) hours as needed for severe pain.   traZODone  (DESYREL) 50 MG tablet Take 0.5-1 tablets (25-50 mg total) by mouth at bedtime as needed for sleep.   venlafaxine XR (EFFEXOR-XR) 75 MG 24 hr capsule Take 1 capsule (75 mg total) by mouth daily with breakfast.   No current facility-administered medications on file prior to visit.  Allergies:  No Known Allergies  Social History:  Social History   Socioeconomic History   Marital status: Married    Spouse name: Not on file   Number of children: Not on file   Years of education: Not on file   Highest education level: Some college, no degree  Occupational History   Not on file  Tobacco Use   Smoking status: Never   Smokeless tobacco: Never  Vaping Use   Vaping status: Never Used  Substance and Sexual Activity   Alcohol use: Yes    Alcohol/week: 1.0 standard drink of alcohol    Types: 1 Glasses of wine per week    Comment: 3 days per week   Drug use: No   Sexual activity: Yes  Other Topics Concern   Not on file  Social History Narrative   Not on file   Social Drivers of Health   Financial Resource Strain: Low Risk  (08/13/2023)   [facility]   Overall Financial Resource Strain (CARDIA)    Difficulty of Paying Living Expenses: Not hard at all  Food Insecurity: No Food Insecurity (08/13/2023)   [facility]   Hunger Vital Sign    Worried About Running Out of Food in the Last Year: Never true    Ran Out of Food in the Last Year: Never true  Transportation Needs: No Transportation Needs (08/13/2023)   Received from [facility] - Transportation    In the past 12 months, has lack of transportation kept you from medical appointments or from getting medications?: No    Lack of Transportation (Non-Medical): No  Physical Activity: Insufficiently Active (08/02/2022)   Exercise Vital Sign    Days of Exercise per Week: 3 days    Minutes of Exercise per Session: 40 min  Stress: Stress Concern  Present (08/02/2022)   [employer] of Occupational Health - Occupational Stress Questionnaire    Feeling of Stress : To some extent  Social Connections: Socially Integrated (08/02/2022)   Social Connection and Isolation Panel [NHANES]    Frequency of Communication with Friends and Family: More than three times a week    Frequency of Social Gatherings with Friends and Family: Once a week    Attends Religious Services: More than 4 times per year    Active Member of [employer] or Organizations: Yes    Attends Engineer, structural: More than 4 times per year    Marital Status: Married  Catering manager Violence: Not on file   Social History   Tobacco Use  Smoking Status Never  Smokeless Tobacco Never   Social History   Substance and Sexual Activity  Alcohol Use Yes   Alcohol/week: 1.0 standard drink of alcohol   Types: 1 Glasses of wine per week   Comment: 3 days per week    Family History:  Family History  Adopted: Yes  Problem Relation Age of Onset   Breast cancer Neg Hx     Past medical history, surgical history, medications, allergies, family history and social history reviewed with patient today and changes made to appropriate areas of the chart.   ROS All other ROS negative except what is listed above and in the HPI.      Objective:    BP 134/86 (BP Location: Left Arm, Patient Position: Sitting)   Pulse 76   Temp 98 F (36.7 C) (Oral)   Ht 5' 8.3" (1.735 m)  Wt 191 lb 9.6 oz (86.9 kg)   SpO2 98%   BMI 28.88 kg/m   Wt Readings from Last 3 Encounters:  08/22/23 191 lb 9.6 oz (86.9 kg)  03/28/23 183 lb (83 kg)  02/20/23 180 lb 8 oz (81.9 kg)    Physical Exam Vitals and nursing note reviewed. Exam conducted with a chaperone present.  Constitutional:      General: She is awake. She is not in acute distress.    Appearance: She is well-developed and well-groomed. She is not ill-appearing or toxic-appearing.  HENT:     Head: Normocephalic and  atraumatic.     Right Ear: Hearing, tympanic membrane, ear canal and external ear normal. No drainage.     Left Ear: Hearing, tympanic membrane, ear canal and external ear normal. No drainage.     Nose: Nose normal.     Right Sinus: No maxillary sinus tenderness or frontal sinus tenderness.     Left Sinus: No maxillary sinus tenderness or frontal sinus tenderness.     Mouth/Throat:     Mouth: Mucous membranes are moist.     Pharynx: Oropharynx is clear. Uvula midline. No pharyngeal swelling, oropharyngeal exudate or posterior oropharyngeal erythema.  Eyes:     General: Lids are normal.        Right eye: No discharge.        Left eye: No discharge.     Extraocular Movements: Extraocular movements intact.     Conjunctiva/sclera: Conjunctivae normal.     Pupils: Pupils are equal, round, and reactive to light.     Visual Fields: Right eye visual fields normal and left eye visual fields normal.  Neck:     Thyroid: No thyromegaly.     Vascular: No carotid bruit.     Trachea: Trachea normal.  Cardiovascular:     Rate and Rhythm: Normal rate and regular rhythm.     Heart sounds: Normal heart sounds. No murmur heard.    No gallop.  Pulmonary:     Effort: Pulmonary effort is normal. No accessory muscle usage or respiratory distress.     Breath sounds: Normal breath sounds.  Chest:  Breasts:    Right: Normal.     Left: Normal.  Abdominal:     General: Bowel sounds are normal.     Palpations: Abdomen is soft. There is no hepatomegaly or splenomegaly.     Tenderness: There is no abdominal tenderness.  Musculoskeletal:        General: Normal range of motion.     Cervical back: Normal range of motion and neck supple.     Right lower leg: No edema.     Left lower leg: No edema.  Lymphadenopathy:     Head:     Right side of head: No submental, submandibular, tonsillar, preauricular or posterior auricular adenopathy.     Left side of head: No submental, submandibular, tonsillar,  preauricular or posterior auricular adenopathy.     Cervical: No cervical adenopathy.     Upper Body:     Right upper body: No supraclavicular, axillary or pectoral adenopathy.     Left upper body: No supraclavicular, axillary or pectoral adenopathy.  Skin:    General: Skin is warm and dry.     Capillary Refill: Capillary refill takes less than 2 seconds.     Findings: No rash.  Neurological:     Mental Status: She is alert and oriented to person, place, and time.     Gait: Gait is  intact.     Deep Tendon Reflexes: Reflexes are normal and symmetric.     Reflex Scores:      Brachioradialis reflexes are 2+ on the right side and 2+ on the left side.      Patellar reflexes are 2+ on the right side and 2+ on the left side. Psychiatric:        Attention and Perception: Attention normal.        Mood and Affect: Mood normal.        Speech: Speech normal.        Behavior: Behavior normal. Behavior is cooperative.        Thought Content: Thought content normal.        Judgment: Judgment normal.     Results for orders placed or performed in visit on 08/22/23  Microalbumin, Urine Waived   Collection Time: 08/22/23  9:56 AM  Result Value Ref Range   Microalb, Ur Waived 30 (H) 0 - 19 mg/L   Creatinine, Urine Waived 100 10 - 300 mg/dL   Microalb/Creat Ratio <30 <30 mg/g      Assessment & Plan:   Problem List Items Addressed This Visit       Cardiovascular and Mediastinum   Hypertension   Chronic, stable.  BP above goal but had not take medications this morning.  Continue current medication regimen, Benazepril daily, adjust as needed.  Could consider addition of BB if return of palpitations in future.  Recommend she monitor BP at home at least a few days a week and document.  DASH diet recommended.  LABS: CBC, CMP.  Return in 6 months.      Relevant Orders   CBC with Differential/Platelet   Comprehensive metabolic panel with GFR     Digestive   Hepatic steatosis   Ongoing.  Noted  on imaging in January 2023 -- mild elevation LFTs occasionally on checks due to alcohol use at time.  Recommend complete cessation alcohol use and avoid Tylenol.  Recheck levels today.      Relevant Orders   Comprehensive metabolic panel with GFR   Gamma GT     Genitourinary   Chronic kidney disease, stage 3a (HCC)   Fluctuates on labs.  Continue Benazepril for kidney protection.  Recommend increased water intake at home and decrease Ibuprofen use.  Recheck CMP and urine ALB.      Relevant Orders   Microalbumin, Urine Waived (Completed)   CBC with Differential/Platelet   Comprehensive metabolic panel with GFR     Other   Vitamin D deficiency   Ongoing.  Noted on past labs, recheck today and adjust supplement as needed.      Relevant Orders   VITAMIN D 25 Hydroxy (Vit-D Deficiency, Fractures)   Insomnia   Chronic, ongoing.  Continue Trazodone as offers benefit to sleep, recommend she take this nightly for full benefit.  Recommend focus on healthy sleep hygiene.   ?Maintain a regular sleep schedule, particularly a regular wake-up time in the morning ?Try not to force sleep ?Avoid caffeinated beverages after lunch ?Avoid alcohol near bedtime (eg, late afternoon and evening)  ?Avoid smoking or other nicotine intake, particularly during the evening ?Adjust the bedroom environment as needed to decrease stimuli (eg, reduce ambient light, turn off the television or radio) ?Avoid prolonged use of light-emitting screens (laptops, tablets, smartphones, ebooks) before bedtime  ?Resolve concerns or worries before bedtime ?Exercise regularly for at least 20 minutes, preferably more than four to five hours prior to bedtime  ?  Avoid daytime naps, especially if they are longer than 20 to 30 minutes or occur late in the day       Generalized anxiety disorder   Refer to depression plan of care.      Relevant Medications   buPROPion (WELLBUTRIN XL) 150 MG 24 hr tablet   Elevated low density  lipoprotein (LDL) cholesterol level   Ongoing on labs.  At this time LDL <190 and ASCVD 2.3%.  Continue diet and exercise focus -- lipid panel today.       Relevant Orders   Comprehensive metabolic panel with GFR   Lipid Panel w/o Chol/HDL Ratio   Depression - Primary   Chronic, exacerbated by stressors  She denies SI/HI.  Will continue Effexor XR 75 MG, but restart Wellbutrin with XL dosing 150 MG.  She tolerated this in past and it may help her energy levels and anhedonia + reduction in alcohol use.  Recommend therapy via [facility] or [facility].  Return in 5 weeks.      Relevant Medications   buPROPion (WELLBUTRIN XL) 150 MG 24 hr tablet   BMI 27.0-27.9,adult   BMI 28.88.  Recommended eating smaller high protein, low fat meals more frequently and exercising 30 mins a day 5 times a week with a goal of 10-15lb weight loss in the next 3 months. Patient voiced their understanding and motivation to adhere to these recommendations.         Alcohol use   Refer to depression plan of care.  Recommend complete cessation of use and attending AA meetings.      Other Visit Diagnoses       Encounter for annual physical exam       Annual physical today with labs and health maintenance reviewed, discussed with patient.        Follow up plan: Return in about 5 weeks (around 09/26/2023) for DEPRESSION -- added Wellbutrin XL 150 MG (virtual).   LABORATORY TESTING:  - Pap smear: Due in 2026  IMMUNIZATIONS:   - Tdap: Tetanus vaccination status reviewed: last tetanus booster within 10 years. - Influenza: Up to date - Pneumovax: Not applicable - Prevnar: Not applicable - COVID: Up to date x 3 - HPV: Not applicable - Shingrix vaccine: Refused  SCREENING: -Mammogram: Ordered today  - Colonoscopy: Up to date  - Bone Density: Not applicable  -Hearing Test: Not applicable  -Spirometry: Not applicable   PATIENT COUNSELING:   Advised to take 1 mg of folate supplement per day if capable  of pregnancy.   Sexuality: Discussed sexually transmitted diseases, partner selection, use of condoms, avoidance of unintended pregnancy  and contraceptive alternatives.   Advised to avoid cigarette smoking.  I discussed with the patient that most people either abstain from alcohol or drink within safe limits (<=14/week and <=4 drinks/occasion for males, <=7/weeks and <= 3 drinks/occasion for females) and that the risk for alcohol disorders and other health effects rises proportionally with the number of drinks per week and how often a drinker exceeds daily limits.  Discussed cessation/primary prevention of drug use and availability of treatment for abuse.   Diet: Encouraged to adjust caloric intake to maintain  or achieve ideal body weight, to reduce intake of dietary saturated fat and total fat, to limit sodium intake by avoiding high sodium foods and not adding table salt, and to maintain adequate dietary potassium and calcium preferably from fresh fruits, vegetables, and low-fat dairy products.    Stressed the importance of regular exercise  Injury  prevention: Discussed safety belts, safety helmets, smoke detector, smoking near bedding or upholstery.   Dental health: Discussed importance of regular tooth brushing, flossing, and dental visits.    NEXT PREVENTATIVE PHYSICAL DUE IN 1 YEAR. Return in about 5 weeks (around 09/26/2023) for DEPRESSION -- added Wellbutrin XL 150 MG (virtual).

## 2023-08-22 NOTE — Assessment & Plan Note (Signed)
 Refer to depression plan of care.  Recommend complete cessation of use and attending AA meetings.

## 2023-08-22 NOTE — Assessment & Plan Note (Signed)
 Ongoing.  Noted on imaging in January 2023 -- mild elevation LFTs occasionally on checks due to alcohol use at time.  Recommend complete cessation alcohol use and avoid Tylenol.  Recheck levels today.

## 2023-08-22 NOTE — Assessment & Plan Note (Signed)
 Chronic, exacerbated by stressors  She denies SI/HI.  Will continue Effexor XR 75 MG, but restart Wellbutrin with XL dosing 150 MG.  She tolerated this in past and it may help her energy levels and anhedonia + reduction in alcohol use.  Recommend therapy via [facility] or [facility].  Return in 5 weeks.

## 2023-08-22 NOTE — Assessment & Plan Note (Addendum)
 Chronic, stable.  BP above goal but had not take medications this morning.  Continue current medication regimen, Benazepril daily, adjust as needed.  Could consider addition of BB if return of palpitations in future.  Recommend she monitor BP at home at least a few days a week and document.  DASH diet recommended.  LABS: CBC, CMP.  Return in 6 months.

## 2023-08-22 NOTE — Assessment & Plan Note (Signed)
 Ongoing on labs.  At this time LDL <190 and ASCVD 2.3%.  Continue diet and exercise focus -- lipid panel today.

## 2023-08-22 NOTE — Assessment & Plan Note (Signed)
 BMI 28.88.  Recommended eating smaller high protein, low fat meals more frequently and exercising 30 mins a day 5 times a week with a goal of 10-15lb weight loss in the next 3 months. Patient voiced their understanding and motivation to adhere to these recommendations.

## 2023-08-22 NOTE — Assessment & Plan Note (Signed)
 Refer to depression plan of care.

## 2023-08-22 NOTE — Assessment & Plan Note (Signed)
 Fluctuates on labs.  Continue Benazepril for kidney protection.  Recommend increased water intake at home and decrease Ibuprofen use.  Recheck CMP and urine ALB.

## 2023-08-22 NOTE — Assessment & Plan Note (Signed)
Ongoing.  Noted on past labs, recheck today and adjust supplement as needed.

## 2023-08-22 NOTE — Assessment & Plan Note (Addendum)
 Chronic, ongoing.  Continue Trazodone as offers benefit to sleep, recommend she take this nightly for full benefit.  Recommend focus on healthy sleep hygiene.   ?Maintain a regular sleep schedule, particularly a regular wake-up time in the morning ?Try not to force sleep ?Avoid caffeinated beverages after lunch ?Avoid alcohol near bedtime (eg, late afternoon and evening)  ?Avoid smoking or other nicotine intake, particularly during the evening ?Adjust the bedroom environment as needed to decrease stimuli (eg, reduce ambient light, turn off the television or radio) ?Avoid prolonged use of light-emitting screens (laptops, tablets, smartphones, ebooks) before bedtime  ?Resolve concerns or worries before bedtime ?Exercise regularly for at least 20 minutes, preferably more than four to five hours prior to bedtime  ?Avoid daytime naps, especially if they are longer than 20 to 30 minutes or occur late in the day

## 2023-08-23 ENCOUNTER — Encounter: Payer: Self-pay | Admitting: Nurse Practitioner

## 2023-08-23 LAB — COMPREHENSIVE METABOLIC PANEL WITH GFR
ALT: 12 IU/L (ref 0–32)
AST: 18 IU/L (ref 0–40)
Albumin: 4.4 g/dL (ref 3.8–4.9)
Alkaline Phosphatase: 99 IU/L (ref 44–121)
BUN/Creatinine Ratio: 16 (ref 9–23)
BUN: 14 mg/dL (ref 6–24)
Bilirubin Total: 1 mg/dL (ref 0.0–1.2)
CO2: 23 mmol/L (ref 20–29)
Calcium: 9.8 mg/dL (ref 8.7–10.2)
Chloride: 104 mmol/L (ref 96–106)
Creatinine, Ser: 0.88 mg/dL (ref 0.57–1.00)
Globulin, Total: 2.7 g/dL (ref 1.5–4.5)
Glucose: 87 mg/dL (ref 70–99)
Potassium: 4.6 mmol/L (ref 3.5–5.2)
Sodium: 141 mmol/L (ref 134–144)
Total Protein: 7.1 g/dL (ref 6.0–8.5)
eGFR: 78 mL/min/{1.73_m2} (ref >59)

## 2023-08-23 LAB — CBC WITH DIFFERENTIAL/PLATELET
Basophils Absolute: 0 10*3/uL (ref 0.0–0.2)
Basos: 1 %
EOS (ABSOLUTE): 0.2 10*3/uL (ref 0.0–0.4)
Eos: 3 %
Hematocrit: 42.2 % (ref 34.0–46.6)
Hemoglobin: 13.9 g/dL (ref 11.1–15.9)
Immature Grans (Abs): 0 10*3/uL (ref 0.0–0.1)
Immature Granulocytes: 0 %
Lymphocytes Absolute: 1.8 10*3/uL (ref 0.7–3.1)
Lymphs: 28 %
MCH: 30.9 pg (ref 26.6–33.0)
MCHC: 32.9 g/dL (ref 31.5–35.7)
MCV: 94 fL (ref 79–97)
Monocytes Absolute: 0.4 10*3/uL (ref 0.1–0.9)
Monocytes: 5 %
Neutrophils Absolute: 4.1 10*3/uL (ref 1.4–7.0)
Neutrophils: 63 %
Platelets: 289 10*3/uL (ref 150–450)
RBC: 4.5 x10E6/uL (ref 3.77–5.28)
RDW: 13.3 % (ref 11.7–15.4)
WBC: 6.5 10*3/uL (ref 3.4–10.8)

## 2023-08-23 LAB — LIPID PANEL W/O CHOL/HDL RATIO
Cholesterol, Total: 233 mg/dL — ABNORMAL HIGH (ref 100–199)
HDL: 79 mg/dL (ref >39)
LDL Chol Calc (NIH): 134 mg/dL — ABNORMAL HIGH (ref 0–99)
Triglycerides: 118 mg/dL (ref 0–149)
VLDL Cholesterol Cal: 20 mg/dL (ref 5–40)

## 2023-08-23 LAB — VITAMIN D 25 HYDROXY (VIT D DEFICIENCY, FRACTURES): Vit D, 25-Hydroxy: 35.9 ng/mL (ref 30.0–100.0)

## 2023-08-23 LAB — GAMMA GT: GGT: 23 IU/L (ref 0–60)

## 2023-08-23 NOTE — Progress Notes (Signed)
 Contacted via MyChart   Good morning Alexandra Le, I have reviewed all labs and overall they look great with exception of lipid panel which continues to show elevation in LDL and total cholesterol.  No changes needed.  Great job!!! Keep being amazing!!  Thank you for allowing me to participate in your care.  I appreciate you. Kindest regards, Ebrahim Deremer

## 2023-09-13 ENCOUNTER — Other Ambulatory Visit: Payer: Self-pay | Admitting: Nurse Practitioner

## 2023-09-17 NOTE — Telephone Encounter (Signed)
 Refilling for 90 days.  Requested Prescriptions  Pending Prescriptions Disp Refills   buPROPion  (WELLBUTRIN  XL) 150 MG 24 hr tablet [Pharmacy Med Name: BUPROPION  HCL XL 150 MG TABLET] 180 tablet 0    Sig: TAKE 1 TABLET BY MOUTH EVERY DAY     Psychiatry: Antidepressants - bupropion  Failed - 09/17/2023  9:29 AM      Failed - Valid encounter within last 6 months    Recent Outpatient Visits           3 weeks ago Severe episode of recurrent major depressive disorder, without psychotic features (HCC)   [facility] Baxley, Ripon T, NP              Passed - Cr in normal range and within 360 days    Creatinine  Date Value Ref Range Status  12/01/2022 220.6 20.0 - 300.0 mg/dL Final   Creatinine, Ser  Date Value Ref Range Status  08/22/2023 0.88 0.57 - 1.00 mg/dL Final         Passed - AST in normal range and within 360 days    AST  Date Value Ref Range Status  08/22/2023 18 0 - 40 IU/L Final         Passed - ALT in normal range and within 360 days    ALT  Date Value Ref Range Status  08/22/2023 12 0 - 32 IU/L Final         Passed - Completed PHQ-2 or PHQ-9 in the last 360 days      Passed - Last BP in normal range    BP Readings from Last 1 Encounters:  08/22/23 134/86

## 2023-09-23 NOTE — Patient Instructions (Signed)
 Be Involved in Caring For Your Health:  Taking Medications When medications are taken as directed, they can greatly improve your health. But if they are not taken as prescribed, they may not work. In some cases, not taking them correctly can be harmful. To help ensure your treatment remains effective and safe, understand your medications and how to take them. Bring your medications to each visit for review by your provider.  Your lab results, notes, and after visit summary will be available on My Chart. We strongly encourage you to use this feature. If lab results are abnormal the clinic will contact you with the appropriate steps. If the clinic does not contact you assume the results are satisfactory. You can always view your results on My Chart. If you have questions regarding your health or results, please contact the clinic during office hours. You can also ask questions on My Chart.  We at [facility] are grateful that you chose [place] to provide your care. We strive to provide evidence-based and compassionate care and are always looking for feedback. If you get a survey from the clinic please complete this so we can hear your opinions.  Managing Depression, Adult Depression is a mental health condition that affects your thoughts, feelings, and actions. Being diagnosed with depression can bring you relief if you did not know why you have felt or behaved a certain way. It could also leave you feeling overwhelmed. Finding ways to manage your symptoms can help you feel more positive about your future. How to manage lifestyle changes Being depressed is difficult. Depression can increase the level of everyday stress. Stress can make depression symptoms worse. You may believe your symptoms cannot be managed or will never improve. However, there are many things you can try to help manage your symptoms. There is hope. Managing stress  Stress is your body's reaction to life changes and events,  both good and bad. Stress can add to your feelings of depression. Learning to manage your stress can help lessen your feelings of depression. Try some of the following approaches to reducing your stress (stress reduction techniques): Listen to music that you enjoy and that inspires you. Try using a meditation app or take a meditation class. Develop a practice that helps you connect with your spiritual self. Walk in nature, pray, or go to a place of worship. Practice deep breathing. To do this, inhale slowly through your nose. Pause at the top of your inhale for a few seconds and then exhale slowly, letting yourself relax. Repeat this three or four times. Practice yoga to help relax and work your muscles. Choose a stress reduction technique that works for you. These techniques take time and practice to develop. Set aside 5-15 minutes a day to do them. Therapists can offer training in these techniques. Do these things to help manage stress: Keep a journal. Know your limits. Set healthy boundaries for yourself and others, such as saying "no" when you think something is too much. Pay attention to how you react to certain situations. You may not be able to control everything, but you can change your reaction. Add humor to your life by watching funny movies or shows. Make time for activities that you enjoy and that relax you. Spend less time using electronics, especially at night before bed. The light from screens can make your brain think it is time to get up rather than go to bed.  Medicines Medicines, such as antidepressants, are often a part of  treatment for depression. Talk with your pharmacist or health care provider about all the medicines, supplements, and herbal products that you take, their possible side effects, and what medicines and other products are safe to take together. Make sure to report any side effects you may have to your health care provider. Relationships Your health care  provider may suggest family therapy, couples therapy, or individual therapy as part of your treatment. How to recognize changes Everyone responds differently to treatment for depression. As you recover from depression, you may start to: Have more interest in doing activities. Feel more hopeful. Have more energy. Eat a more regular amount of food. Have better mental focus. It is important to recognize if your depression is not getting better or is getting worse. The symptoms you had in the beginning may return, such as: Feeling tired. Eating too much or too little. Sleeping too much or too little. Feeling restless, agitated, or hopeless. Trouble focusing or making decisions. Having unexplained aches and pains. Feeling irritable, angry, or aggressive. If you or your family members notice these symptoms coming back, let your health care provider know right away. Follow these instructions at home: Activity Try to get some form of exercise each day, such as walking. Try yoga, mindfulness, or other stress reduction techniques. Participate in group activities if you are able. Lifestyle Get enough sleep. Cut down on or stop using caffeine, tobacco, alcohol, and any other harmful substances. Eat a healthy diet that includes plenty of vegetables, fruits, whole grains, low-fat dairy products, and lean protein. Limit foods that are high in solid fats, added sugar, or salt (sodium). General instructions Take over-the-counter and prescription medicines only as told by your health care provider. Keep all follow-up visits. It is important for your health care provider to check on your mood, behavior, and medicines. Your health care provider may need to make changes to your treatment. Where to find support Talking to others  Friends and family members can be sources of support and guidance. Talk to trusted friends or family members about your condition. Explain your symptoms and let them know that you  are working with a health care provider to treat your depression. Tell friends and family how they can help. Finances Find mental health providers that fit with your financial situation. Talk with your health care provider if you are worried about access to food, housing, or medicine. Call your insurance company to learn about your co-pays and prescription plan. Where to find more information You can find support in your area from: Anxiety and Depression Association of America (ADAA): adaa.org Mental Health America: mentalhealthamerica.net The [employer] on Mental Illness: nami.org Contact a health care provider if: You stop taking your antidepressant medicines, and you have any of these symptoms: Nausea. Headache. Light-headedness. Chills and body aches. Not being able to sleep (insomnia). You or your friends and family think your depression is getting worse. Get help right away if: You have thoughts of hurting yourself or others. Get help right away if you feel like you may hurt yourself or others, or have thoughts about taking your own life. Go to your nearest emergency room or: Call 911. Call the National Suicide Prevention Lifeline at 315-804-2514 or 988. This is open 24 hours a day. Text the Crisis Text Line at 902-341-0291. This information is not intended to replace advice given to you by your health care provider. Make sure you discuss any questions you have with your health care provider. Document Revised: 08/30/2021 Document Reviewed:  08/30/2021 Elsevier Patient Education  2024 [employer].

## 2023-09-26 ENCOUNTER — Telehealth: Payer: Self-pay

## 2023-09-26 NOTE — Telephone Encounter (Signed)
 Patient called asking when she was due for her next colonoscopy. I let her know that she had one done 03/28/2023 and she was due in five years from then. I also let her know that she was already in our recall list. Patient had no further questions.

## 2023-09-28 ENCOUNTER — Encounter: Payer: Self-pay | Admitting: Nurse Practitioner

## 2023-09-28 ENCOUNTER — Telehealth (INDEPENDENT_AMBULATORY_CARE_PROVIDER_SITE_OTHER): Admitting: Nurse Practitioner

## 2023-09-28 VITALS — BP 172/113 | Wt 189.0 lb

## 2023-09-28 DIAGNOSIS — F332 Major depressive disorder, recurrent severe without psychotic features: Secondary | ICD-10-CM

## 2023-09-28 DIAGNOSIS — F411 Generalized anxiety disorder: Secondary | ICD-10-CM | POA: Diagnosis not present

## 2023-09-28 DIAGNOSIS — I10 Essential (primary) hypertension: Secondary | ICD-10-CM

## 2023-09-28 DIAGNOSIS — F5101 Primary insomnia: Secondary | ICD-10-CM | POA: Diagnosis not present

## 2023-09-28 MED ORDER — BUPROPION HCL ER (XL) 300 MG PO TB24: 300.0000 mg | ORAL_TABLET | Freq: Every day | ORAL | 3 refills | Status: AC

## 2023-09-28 NOTE — Assessment & Plan Note (Addendum)
 Chronic, exacerbated due to recent stressors.  BP above goal at home and in GYN office on review.  Continue current medication regimen, Benazepril  daily as is at max dose, and restart HCTZ 25 MG daily which she tolerated in past.  Monitor kidney function closely with this.  Last K+ in normal range.  Could consider addition of Amlodipine or BB if return of palpitations in future.  Recommend she monitor BP at home at least a few days a week and document.  DASH diet recommended.  LABS: up to date.  She will return in 4 weeks, but will send BP checks to provider for updates until then.  No red flag symptoms reported. - Recommend cut back on alcohol use.

## 2023-09-28 NOTE — Progress Notes (Signed)
 BP (!) 172/113   Wt 189 lb (85.7 kg)   BMI 28.49 kg/m    Subjective:    Patient ID: Alexandra Le, female    DOB: 01/26/1971, 53 y.o.   MRN: [id number]  HPI: Alexandra Le is a 53 y.o. female  Chief Complaint  Patient presents with   Depression   Hypertension   Virtual Visit via Video Note  I connected with Alexandra Le on 09/28/23 at  9:00 AM EDT by a video enabled telemedicine application and verified that I am speaking with the correct person using two identifiers.  Location: Patient: home Provider: work   I discussed the limitations of evaluation and management by telemedicine and the availability of in person appointments. The patient expressed understanding and agreed to proceed.  I discussed the assessment and treatment plan with the patient. The patient was provided an opportunity to ask questions and all were answered. The patient agreed with the plan and demonstrated an understanding of the instructions.   The patient was advised to call back or seek an in-person evaluation if the symptoms worsen or if the condition fails to improve as anticipated.  I provided 25 minutes of non-face-to-face time during this encounter.   Viraaj Vorndran T Antoria Lanza, NP   HYPERTENSION without Chronic Kidney Disease (recent CKD labs stable) Continues Benazepril  40 MG daily, took HCTZ in past.  BP has been elevated recently at home and at [facility] office.  She was told to notify PCP.  Has been stressed due to recent GYN abnormal pap (polyp) and postmenopausal bleeding.  Has not taken medication yet this morning. Hypertension status: exacerbated   Drinking beer twice a week, will have two beers at one sitting.  Satisfied with current treatment? yes Duration of hypertension: chronic BP monitoring frequency:  trying to check daily BP range: 160/104 on average BP medication side effects:  no Medication compliance: good compliance Aspirin: no Recurrent headaches: no Visual  changes: no Palpitations: when stressed beats harder Dyspnea: no Chest pain: no Lower extremity edema: at baseline has some Dizzy/lightheaded: no   DEPRESSION & ANXIETY Follow-up for initiation of Wellbutrin  XL 150 MG on 09/17/23 -- tolerating this well.  She has taken in past without issue. Continues Effexor  XR 75 MG daily and Trazodone  for sleep. Mood status: improving Satisfied with current treatment?: yes Symptom severity: moderate  Duration of current treatment : chronic Side effects: no Medication compliance: good compliance Psychotherapy/counseling: yes in the past Depressed mood: here and there Anxious mood: yes Anhedonia: yes Significant weight loss or gain: no Insomnia: yes hard to fall asleep Fatigue: yes Feelings of worthlessness or guilt: yes Impaired concentration/indecisiveness: somewhat Suicidal ideations: no Hopelessness: yes Crying spells: yes    09/28/2023    8:56 AM 08/22/2023    8:59 AM 12/01/2022    9:17 AM 08/04/2022   10:50 AM 04/11/2022   11:42 AM  Depression screen PHQ 2/9  Decreased Interest 2 2 0 0 0  Down, Depressed, Hopeless 1 2 0 0 0  PHQ - 2 Score 3 4 0 0 0  Altered sleeping 3 3 3 1 2   Tired, decreased energy 2 3 2 1 1   Change in appetite 1 2 0 1 0  Feeling bad or failure about yourself  1 1 0 0 0  Trouble concentrating 1 2 1 1 1   Moving slowly or fidgety/restless 1 1 1 1  0  Suicidal thoughts 0 0 0 0 0  PHQ-9 Score 12 16 7 5  4  Difficult doing work/chores Somewhat difficult Very difficult Somewhat difficult Not difficult at all Not difficult at all       09/28/2023    8:58 AM 08/22/2023    8:59 AM 12/01/2022    9:17 AM 08/04/2022   10:50 AM  GAD 7 : Generalized Anxiety Score  Nervous, Anxious, on Edge 1 1 0 0  Control/stop worrying 2 2 0 0  Worry too much - different things 2 2 0 0  Trouble relaxing 1 2 1  0  Restless 1 1 1  0  Easily annoyed or irritable 2 3 1 1   Afraid - awful might happen 0 0 0 0  Total GAD 7 Score 9 11 3 1    Anxiety Difficulty Somewhat difficult Very difficult Not difficult at all Not difficult at all   Relevant past medical, surgical, family and social history reviewed and updated as indicated. Interim medical history since our last visit reviewed. Allergies and medications reviewed and updated.  Review of Systems  Constitutional:  Negative for activity change, appetite change, diaphoresis, fatigue and fever.  Respiratory:  Negative for cough, chest tightness, shortness of breath and wheezing.   Cardiovascular:  Positive for palpitations (occasional rapid heart beat if stressed). Negative for chest pain and leg swelling.  Neurological: Negative.   Psychiatric/Behavioral:  Positive for decreased concentration and sleep disturbance. Negative for self-injury and suicidal ideas. The patient is nervous/anxious.     Per HPI unless specifically indicated above     Objective:     BP (!) 172/113   Wt 189 lb (85.7 kg)   BMI 28.49 kg/m   Wt Readings from Last 3 Encounters:  09/28/23 189 lb (85.7 kg)  08/22/23 191 lb 9.6 oz (86.9 kg)  03/28/23 183 lb (83 kg)    Physical Exam Vitals and nursing note reviewed.  Constitutional:      General: She is awake. She is not in acute distress.    Appearance: She is well-developed and well-groomed. She is not ill-appearing or toxic-appearing.  HENT:     Head: Normocephalic.     Right Ear: Hearing normal.     Left Ear: Hearing normal.  Eyes:     General: Lids are normal.        Right eye: No discharge.        Left eye: No discharge.     Conjunctiva/sclera: Conjunctivae normal.  Pulmonary:     Effort: Pulmonary effort is normal. No accessory muscle usage or respiratory distress.  Musculoskeletal:     Cervical back: Normal range of motion.  Neurological:     Mental Status: She is alert and oriented to person, place, and time.  Psychiatric:        Attention and Perception: Attention normal.        Mood and Affect: Mood normal.        Behavior:  Behavior normal. Behavior is cooperative.        Thought Content: Thought content normal.        Judgment: Judgment normal.     Results for orders placed or performed in visit on 08/22/23  Microalbumin, Urine Waived   Collection Time: 08/22/23  9:56 AM  Result Value Ref Range   Microalb, Ur Waived 30 (H) 0 - 19 mg/L   Creatinine, Urine Waived 100 10 - 300 mg/dL   Microalb/Creat Ratio <30 <30 mg/g  CBC with Differential/Platelet   Collection Time: 08/22/23  9:57 AM  Result Value Ref Range   WBC 6.5 3.4 - 10.8  x10E3/uL   RBC 4.50 3.77 - 5.28 x10E6/uL   Hemoglobin 13.9 11.1 - 15.9 g/dL   Hematocrit 16.1 09.6 - 46.6 %   MCV 94 79 - 97 fL   MCH 30.9 26.6 - 33.0 pg   MCHC 32.9 31.5 - 35.7 g/dL   RDW 04.5 40.9 - 81.1 %   Platelets 289 150 - 450 x10E3/uL   Neutrophils 63 Not Estab. %   Lymphs 28 Not Estab. %   Monocytes 5 Not Estab. %   Eos 3 Not Estab. %   Basos 1 Not Estab. %   Neutrophils Absolute 4.1 1.4 - 7.0 x10E3/uL   Lymphocytes Absolute 1.8 0.7 - 3.1 x10E3/uL   Monocytes Absolute 0.4 0.1 - 0.9 x10E3/uL   EOS (ABSOLUTE) 0.2 0.0 - 0.4 x10E3/uL   Basophils Absolute 0.0 0.0 - 0.2 x10E3/uL   Immature Granulocytes 0 Not Estab. %   Immature Grans (Abs) 0.0 0.0 - 0.1 x10E3/uL  Comprehensive metabolic panel with GFR   Collection Time: 08/22/23  9:57 AM  Result Value Ref Range   Glucose 87 70 - 99 mg/dL   BUN 14 6 - 24 mg/dL   Creatinine, Ser 9.14 0.57 - 1.00 mg/dL   eGFR 78 >78 GN/FAO/1.30   BUN/Creatinine Ratio 16 9 - 23   Sodium 141 134 - 144 mmol/L   Potassium 4.6 3.5 - 5.2 mmol/L   Chloride 104 96 - 106 mmol/L   CO2 23 20 - 29 mmol/L   Calcium 9.8 8.7 - 10.2 mg/dL   Total Protein 7.1 6.0 - 8.5 g/dL   Albumin 4.4 3.8 - 4.9 g/dL   Globulin, Total 2.7 1.5 - 4.5 g/dL   Bilirubin Total 1.0 0.0 - 1.2 mg/dL   Alkaline Phosphatase 99 44 - 121 IU/L   AST 18 0 - 40 IU/L   ALT 12 0 - 32 IU/L  Lipid Panel w/o Chol/HDL Ratio   Collection Time: 08/22/23  9:57 AM  Result Value  Ref Range   Cholesterol, Total 233 (H) 100 - 199 mg/dL   Triglycerides 865 0 - 149 mg/dL   HDL 79 >78 mg/dL   VLDL Cholesterol Cal 20 5 - 40 mg/dL   LDL Chol Calc (NIH) 469 (H) 0 - 99 mg/dL  VITAMIN D  25 Hydroxy (Vit-D Deficiency, Fractures)   Collection Time: 08/22/23  9:57 AM  Result Value Ref Range   Vit D, 25-Hydroxy 35.9 30.0 - 100.0 ng/mL  Gamma GT   Collection Time: 08/22/23  9:57 AM  Result Value Ref Range   GGT 23 0 - 60 IU/L      Assessment & Plan:   Problem List Items Addressed This Visit       Cardiovascular and Mediastinum   Hypertension   Chronic, exacerbated due to recent stressors.  BP above goal at home and in GYN office on review.  Continue current medication regimen, Benazepril  daily as is at max dose, and restart HCTZ 25 MG daily which she tolerated in past.  Monitor kidney function closely with this.  Last K+ in normal range.  Could consider addition of Amlodipine or BB if return of palpitations in future.  Recommend she monitor BP at home at least a few days a week and document.  DASH diet recommended.  LABS: up to date.  She will return in 4 weeks, but will send BP checks to provider for updates until then.  No red flag symptoms reported. - Recommend cut back on alcohol use.  Relevant Medications   hydrochlorothiazide  (HYDRODIURIL ) 25 MG tablet     Other   Insomnia   Chronic, ongoing.  Continue Trazodone  as offers benefit to sleep, recommend she take this nightly for full benefit.  Recommend focus on healthy sleep hygiene.   ?Maintain a regular sleep schedule, particularly a regular wake-up time in the morning ?Try not to force sleep ?Avoid caffeinated beverages after lunch ?Avoid alcohol near bedtime (eg, late afternoon and evening)  ?Avoid smoking or other nicotine intake, particularly during the evening ?Adjust the bedroom environment as needed to decrease stimuli (eg, reduce ambient light, turn off the television or radio) ?Avoid prolonged use of  light-emitting screens (laptops, tablets, smartphones, ebooks) before bedtime  ?Resolve concerns or worries before bedtime ?Exercise regularly for at least 20 minutes, preferably more than four to five hours prior to bedtime  ?Avoid daytime naps, especially if they are longer than 20 to 30 minutes or occur late in the day       Generalized anxiety disorder   Refer to depression plan of care.      Relevant Medications   buPROPion  (WELLBUTRIN  XL) 300 MG 24 hr tablet   Depression - Primary   Chronic, exacerbated by stressors  She denies SI/HI.  Will continue Effexor  XR 75 MG, but increase Wellbutrin  with XL dosing to 300 MG.  She tolerated this in past and it may help her energy levels and anhedonia + reduction in alcohol use.  Recommend therapy via [facility] or [facility].  Recommend cut back on alcohol use.      Relevant Medications   buPROPion  (WELLBUTRIN  XL) 300 MG 24 hr tablet     Follow up plan: Return in about 4 weeks (around 10/26/2023) for HTN AND MOOD -- increased Wellbutrin  XL to 300 and added on HCTZ for BP.

## 2023-09-28 NOTE — Assessment & Plan Note (Signed)
 Chronic, exacerbated by stressors  She denies SI/HI.  Will continue Effexor  XR 75 MG, but increase Wellbutrin  with XL dosing to 300 MG.  She tolerated this in past and it may help her energy levels and anhedonia + reduction in alcohol use.  Recommend therapy via [facility] or [facility].  Recommend cut back on alcohol use.

## 2023-09-28 NOTE — Assessment & Plan Note (Signed)
 Refer to depression plan of care.

## 2023-09-28 NOTE — Assessment & Plan Note (Signed)
 Chronic, ongoing.  Continue Trazodone as offers benefit to sleep, recommend she take this nightly for full benefit.  Recommend focus on healthy sleep hygiene.   ?Maintain a regular sleep schedule, particularly a regular wake-up time in the morning ?Try not to force sleep ?Avoid caffeinated beverages after lunch ?Avoid alcohol near bedtime (eg, late afternoon and evening)  ?Avoid smoking or other nicotine intake, particularly during the evening ?Adjust the bedroom environment as needed to decrease stimuli (eg, reduce ambient light, turn off the television or radio) ?Avoid prolonged use of light-emitting screens (laptops, tablets, smartphones, ebooks) before bedtime  ?Resolve concerns or worries before bedtime ?Exercise regularly for at least 20 minutes, preferably more than four to five hours prior to bedtime  ?Avoid daytime naps, especially if they are longer than 20 to 30 minutes or occur late in the day

## 2023-10-02 NOTE — Progress Notes (Signed)
 Called patient to schedule appt. Patient stated she would call back by the end of the week to schedule appt.

## 2023-12-04 ENCOUNTER — Encounter: Payer: Self-pay | Admitting: Nurse Practitioner

## 2023-12-04 MED ORDER — BENAZEPRIL HCL 40 MG PO TABS: 40.0000 mg | ORAL_TABLET | Freq: Every day | ORAL | 0 refills | Status: DC

## 2023-12-11 NOTE — Telephone Encounter (Signed)
 Appt scheduled

## 2023-12-14 ENCOUNTER — Other Ambulatory Visit: Payer: Self-pay | Admitting: Nurse Practitioner

## 2023-12-18 NOTE — Telephone Encounter (Signed)
 Requested Prescriptions  Pending Prescriptions Disp Refills   venlafaxine  XR (EFFEXOR -XR) 75 MG 24 hr capsule [Pharmacy Med Name: Venlafaxine  HCl ER 75 MG Oral Capsule Extended Release 24 Hour] 90 capsule 0    Sig: TAKE 1 CAPSULE BY MOUTH DAILY  WITH BREAKFAST     Psychiatry: Antidepressants - SNRI - desvenlafaxine & venlafaxine  Failed - 12/18/2023  3:26 PM      Failed - Last BP in normal range    BP Readings from Last 1 Encounters:  09/28/23 (!) 172/113         Failed - Lipid Panel in normal range within the last 12 months    Cholesterol, Total  Date Value Ref Range Status  08/22/2023 233 (H) 100 - 199 mg/dL Final   LDL Chol Calc (NIH)  Date Value Ref Range Status  08/22/2023 134 (H) 0 - 99 mg/dL Final   HDL  Date Value Ref Range Status  08/22/2023 79 >39 mg/dL Final   Triglycerides  Date Value Ref Range Status  08/22/2023 118 0 - 149 mg/dL Final         Passed - Cr in normal range and within 360 days    Creatinine  Date Value Ref Range Status  12/01/2022 220.6 20.0 - 300.0 mg/dL Final   Creatinine, Ser  Date Value Ref Range Status  08/22/2023 0.88 0.57 - 1.00 mg/dL Final         Passed - Completed PHQ-2 or PHQ-9 in the last 360 days      Passed - Valid encounter within last 6 months    Recent Outpatient Visits           2 months ago Severe episode of recurrent major depressive disorder, without psychotic features (HCC)   [facility] [place], Jolene T, NP   3 months ago Severe episode of recurrent major depressive disorder, without psychotic features (HCC)   [facility] [place], Melanie DASEN, NP

## 2024-01-12 NOTE — Patient Instructions (Incomplete)
 Be Involved in Caring For Your Health:  Taking Medications When medications are taken as directed, they can greatly improve your health. But if they are not taken as prescribed, they may not work. In some cases, not taking them correctly can be harmful. To help ensure your treatment remains effective and safe, understand your medications and how to take them. Bring your medications to each visit for review by your provider.  Your lab results, notes, and after visit summary will be available on My Chart. We strongly encourage you to use this feature. If lab results are abnormal the clinic will contact you with the appropriate steps. If the clinic does not contact you assume the results are satisfactory. You can always view your results on My Chart. If you have questions regarding your health or results, please contact the clinic during office hours. You can also ask questions on My Chart.  We at [facility] are grateful that you chose us  to provide your care. We strive to provide evidence-based and compassionate care and are always looking for feedback. If you get a survey from the clinic please complete this so we can hear your opinions.  Managing Depression, Adult Depression is a mental health condition that affects your thoughts, feelings, and actions. Being diagnosed with depression can bring you relief if you did not know why you have felt or behaved a certain way. It could also leave you feeling overwhelmed. Finding ways to manage your symptoms can help you feel more positive about your future. How to manage lifestyle changes Being depressed is difficult. Depression can increase the level of everyday stress. Stress can make depression symptoms worse. You may believe your symptoms cannot be managed or will never improve. However, there are many things you can try to help manage your symptoms. There is hope. Managing stress  Stress is your body's reaction to life changes and events,  both good and bad. Stress can add to your feelings of depression. Learning to manage your stress can help lessen your feelings of depression. Try some of the following approaches to reducing your stress (stress reduction techniques): Listen to music that you enjoy and that inspires you. Try using a meditation app or take a meditation class. Develop a practice that helps you connect with your spiritual self. Walk in nature, pray, or go to a place of worship. Practice deep breathing. To do this, inhale slowly through your nose. Pause at the top of your inhale for a few seconds and then exhale slowly, letting yourself relax. Repeat this three or four times. Practice yoga to help relax and work your muscles. Choose a stress reduction technique that works for you. These techniques take time and practice to develop. Set aside 5-15 minutes a day to do them. Therapists can offer training in these techniques. Do these things to help manage stress: Keep a journal. Know your limits. Set healthy boundaries for yourself and others, such as saying no when you think something is too much. Pay attention to how you react to certain situations. You may not be able to control everything, but you can change your reaction. Add humor to your life by watching funny movies or shows. Make time for activities that you enjoy and that relax you. Spend less time using electronics, especially at night before bed. The light from screens can make your brain think it is time to get up rather than go to bed.  Medicines Medicines, such as antidepressants, are often a part of  treatment for depression. Talk with your pharmacist or health care provider about all the medicines, supplements, and herbal products that you take, their possible side effects, and what medicines and other products are safe to take together. Make sure to report any side effects you may have to your health care provider. Relationships Your health care  provider may suggest family therapy, couples therapy, or individual therapy as part of your treatment. How to recognize changes Everyone responds differently to treatment for depression. As you recover from depression, you may start to: Have more interest in doing activities. Feel more hopeful. Have more energy. Eat a more regular amount of food. Have better mental focus. It is important to recognize if your depression is not getting better or is getting worse. The symptoms you had in the beginning may return, such as: Feeling tired. Eating too much or too little. Sleeping too much or too little. Feeling restless, agitated, or hopeless. Trouble focusing or making decisions. Having unexplained aches and pains. Feeling irritable, angry, or aggressive. If you or your family members notice these symptoms coming back, let your health care provider know right away. Follow these instructions at home: Activity Try to get some form of exercise each day, such as walking. Try yoga, mindfulness, or other stress reduction techniques. Participate in group activities if you are able. Lifestyle Get enough sleep. Cut down on or stop using caffeine, tobacco, alcohol, and any other harmful substances. Eat a healthy diet that includes plenty of vegetables, fruits, whole grains, low-fat dairy products, and lean protein. Limit foods that are high in solid fats, added sugar, or salt (sodium). General instructions Take over-the-counter and prescription medicines only as told by your health care provider. Keep all follow-up visits. It is important for your health care provider to check on your mood, behavior, and medicines. Your health care provider may need to make changes to your treatment. Where to find support Talking to others  Friends and family members can be sources of support and guidance. Talk to trusted friends or family members about your condition. Explain your symptoms and let them know that you  are working with a health care provider to treat your depression. Tell friends and family how they can help. Finances Find mental health providers that fit with your financial situation. Talk with your health care provider if you are worried about access to food, housing, or medicine. Call your insurance company to learn about your co-pays and prescription plan. Where to find more information You can find support in your area from: Anxiety and Depression Association of America (ADAA): adaa.org Mental Health America: mentalhealthamerica.net [employer] on Mental Illness: nami.org Contact a health care provider if: You stop taking your antidepressant medicines, and you have any of these symptoms: Nausea. Headache. Light-headedness. Chills and body aches. Not being able to sleep (insomnia). You or your friends and family think your depression is getting worse. Get help right away if: You have thoughts of hurting yourself or others. Get help right away if you feel like you may hurt yourself or others, or have thoughts about taking your own life. Go to your nearest emergency room or: Call 911. Call the National Suicide Prevention Lifeline at (743) 630-7432 or 988. This is open 24 hours a day. Text the Crisis Text Line at 854-590-3828. This information is not intended to replace advice given to you by your health care provider. Make sure you discuss any questions you have with your health care provider. Document Revised: 08/30/2021 Document Reviewed:  08/30/2021 Elsevier Patient Education  2024 [employer].

## 2024-01-14 ENCOUNTER — Ambulatory Visit: Admitting: Nurse Practitioner

## 2024-01-14 DIAGNOSIS — F332 Major depressive disorder, recurrent severe without psychotic features: Secondary | ICD-10-CM

## 2024-01-14 DIAGNOSIS — N1831 Chronic kidney disease, stage 3a: Secondary | ICD-10-CM

## 2024-01-14 DIAGNOSIS — I10 Essential (primary) hypertension: Secondary | ICD-10-CM

## 2024-01-14 DIAGNOSIS — F411 Generalized anxiety disorder: Secondary | ICD-10-CM

## 2024-01-20 NOTE — Patient Instructions (Incomplete)
 Be Involved in Caring For Your Health:  Taking Medications When medications are taken as directed, they can greatly improve your health. But if they are not taken as prescribed, they may not work. In some cases, not taking them correctly can be harmful. To help ensure your treatment remains effective and safe, understand your medications and how to take them. Bring your medications to each visit for review by your provider.  Your lab results, notes, and after visit summary will be available on My Chart. We strongly encourage you to use this feature. If lab results are abnormal the clinic will contact you with the appropriate steps. If the clinic does not contact you assume the results are satisfactory. You can always view your results on My Chart. If you have questions regarding your health or results, please contact the clinic during office hours. You can also ask questions on My Chart.  We at [facility] are grateful that you chose us  to provide your care. We strive to provide evidence-based and compassionate care and are always looking for feedback. If you get a survey from the clinic please complete this so we can hear your opinions.  DASH Eating Plan DASH stands for Dietary Approaches to Stop Hypertension. The DASH eating plan is a healthy eating plan that has been shown to: Lower high blood pressure (hypertension). Reduce your risk for type 2 diabetes, heart disease, and stroke. Help with weight loss. What are tips for following this plan? Reading food labels Check food labels for the amount of salt (sodium) per serving. Choose foods with less than 5 percent of the Daily Value (DV) of sodium. In general, foods with less than 300 milligrams (mg) of sodium per serving fit into this eating plan. To find whole grains, look for the word whole as the first word in the ingredient list. Shopping Buy products labeled as low-sodium or no salt added. Buy fresh foods. Avoid canned  foods and pre-made or frozen meals. Cooking Try not to add salt when you cook. Use salt-free seasonings or herbs instead of table salt or sea salt. Check with your health care provider or pharmacist before using salt substitutes. Do not fry foods. Cook foods in healthy ways, such as baking, boiling, grilling, roasting, or broiling. Cook using oils that are good for your heart. These include olive, canola, avocado, soybean, and sunflower oil. Meal planning  Eat a balanced diet. This should include: 4 or more servings of fruits and 4 or more servings of vegetables each day. Try to fill half of your plate with fruits and vegetables. 6-8 servings of whole grains each day. 6 or less servings of lean meat, poultry, or fish each day. 1 oz is 1 serving. A 3 oz (85 g) serving of meat is about the same size as the palm of your hand. One egg is 1 oz (28 g). 2-3 servings of low-fat dairy each day. One serving is 1 cup (237 mL). 1 serving of nuts, seeds, or beans 5 times each week. 2-3 servings of heart-healthy fats. Healthy fats called omega-3 fatty acids are found in foods such as walnuts, flaxseeds, fortified milks, and eggs. These fats are also found in cold-water fish, such as sardines, salmon, and mackerel. Limit how much you eat of: Canned or prepackaged foods. Food that is high in trans fat, such as fried foods. Food that is high in saturated fat, such as fatty meat. Desserts and other sweets, sugary drinks, and other foods with added sugar. Full-fat  dairy products. Do not salt foods before eating. Do not eat more than 4 egg yolks a week. Try to eat at least 2 vegetarian meals a week. Eat more home-cooked food and less restaurant, buffet, and fast food. Lifestyle When eating at a restaurant, ask if your food can be made with less salt or no salt. If you drink alcohol: Limit how much you have to: 0-1 drink a day if you are female. 0-2 drinks a day if you are female. Know how much alcohol is in  your drink. In the [place] one drink is one 12 oz bottle of beer (355 mL), one 5 oz glass of wine (148 mL), or one 1 oz glass of hard liquor (44 mL). General information Avoid eating more than 2,300 mg of salt a day. If you have hypertension, you may need to reduce your sodium intake to 1,500 mg a day. Work with your provider to stay at a healthy body weight or lose weight. Ask what the best weight range is for you. On most days of the week, get at least 30 minutes of exercise that causes your heart to beat faster. This may include walking, swimming, or biking. Work with your provider or dietitian to adjust your eating plan to meet your specific calorie needs. What foods should I eat? Fruits All fresh, dried, or frozen fruit. Canned fruits that are in their natural juice and do not have sugar added to them. Vegetables Fresh or frozen vegetables that are raw, steamed, roasted, or grilled. Low-sodium or reduced-sodium tomato and vegetable juice. Low-sodium or reduced-sodium tomato sauce and tomato paste. Low-sodium or reduced-sodium canned vegetables. Grains Whole-grain or whole-wheat bread. Whole-grain or whole-wheat pasta. Brown rice. Mcneil Madeira. Bulgur. Whole-grain and low-sodium cereals. Pita bread. Low-fat, low-sodium crackers. Whole-wheat flour tortillas. Meats and other proteins Skinless chicken or [place]. Ground chicken or [place]. Pork with fat trimmed off. Fish and seafood. Egg whites. Dried beans, peas, or lentils. Unsalted nuts, nut butters, and seeds. Unsalted canned beans. Lean cuts of beef with fat trimmed off. Low-sodium, lean precooked or cured meat, such as sausages or meat loaves. Dairy Low-fat (1%) or fat-free (skim) milk. Reduced-fat, low-fat, or fat-free cheeses. Nonfat, low-sodium ricotta or cottage cheese. Low-fat or nonfat yogurt. Low-fat, low-sodium cheese. Fats and oils Soft margarine without trans fats. Vegetable oil. Reduced-fat, low-fat, or light mayonnaise and salad  dressings (reduced-sodium). Canola, safflower, olive, avocado, soybean, and sunflower oils. Avocado. Seasonings and condiments Herbs. Spices. Seasoning mixes without salt. Other foods Unsalted popcorn and pretzels. Fat-free sweets. The items listed above may not be all the foods and drinks you can have. Talk to a dietitian to learn more. What foods should I avoid? Fruits Canned fruit in a light or heavy syrup. Fried fruit. Fruit in cream or butter sauce. Vegetables Creamed or fried vegetables. Vegetables in a cheese sauce. Regular canned vegetables that are not marked as low-sodium or reduced-sodium. Regular canned tomato sauce and paste that are not marked as low-sodium or reduced-sodium. Regular tomato and vegetable juices that are not marked as low-sodium or reduced-sodium. Dene. Olives. Grains Baked goods made with fat, such as croissants, muffins, or some breads. Dry pasta or rice meal packs. Meats and other proteins Fatty cuts of meat. Ribs. Fried meat. [place]. Bologna, salami, and other precooked or cured meats, such as sausages or meat loaves, that are not lean and low in sodium. Fat from the back of a pig (fatback). Bratwurst. Salted nuts and seeds. Canned beans with added salt. Canned  or smoked fish. Whole eggs or egg yolks. Chicken or [place] with skin. Dairy Whole or 2% milk, cream, and half-and-half. Whole or full-fat cream cheese. Whole-fat or sweetened yogurt. Full-fat cheese. Nondairy creamers. Whipped toppings. Processed cheese and cheese spreads. Fats and oils Butter. Stick margarine. Lard. Shortening. Ghee. Bacon fat. Tropical oils, such as coconut, palm kernel, or palm oil. Seasonings and condiments Onion salt, garlic salt, seasoned salt, table salt, and sea salt. Worcestershire sauce. Tartar sauce. Barbecue sauce. Teriyaki sauce. Soy sauce, including reduced-sodium soy sauce. Steak sauce. Canned and packaged gravies. Fish sauce. Oyster sauce. Cocktail sauce. Store-bought  horseradish. Ketchup. Mustard. Meat flavorings and tenderizers. Bouillon cubes. Hot sauces. Pre-made or packaged marinades. Pre-made or packaged taco seasonings. Relishes. Regular salad dressings. Other foods Salted popcorn and pretzels. The items listed above may not be all the foods and drinks you should avoid. Talk to a dietitian to learn more. Where to find more information National Heart, Lung, and Blood Institute (NHLBI): [place]DryCleaner.gl American Heart Association (AHA): heart.org Academy of Nutrition and Dietetics: eatright.org National Kidney Foundation (NKF): kidney.org This information is not intended to replace advice given to you by your health care provider. Make sure you discuss any questions you have with your health care provider. Document Revised: 05/11/2022 Document Reviewed: 05/11/2022 Elsevier Patient Education  2024 [employer].

## 2024-01-25 ENCOUNTER — Ambulatory Visit: Admitting: Nurse Practitioner

## 2024-01-25 DIAGNOSIS — N1831 Chronic kidney disease, stage 3a: Secondary | ICD-10-CM

## 2024-01-25 DIAGNOSIS — I10 Essential (primary) hypertension: Secondary | ICD-10-CM

## 2024-01-25 DIAGNOSIS — Z23 Encounter for immunization: Secondary | ICD-10-CM

## 2024-01-25 DIAGNOSIS — F332 Major depressive disorder, recurrent severe without psychotic features: Secondary | ICD-10-CM

## 2024-02-17 NOTE — Patient Instructions (Signed)
 Be Involved in Caring For Your Health:  Taking Medications When medications are taken as directed, they can greatly improve your health. But if they are not taken as prescribed, they may not work. In some cases, not taking them correctly can be harmful. To help ensure your treatment remains effective and safe, understand your medications and how to take them. Bring your medications to each visit for review by your provider.  Your lab results, notes, and after visit summary will be available on My Chart. We strongly encourage you to use this feature. If lab results are abnormal the clinic will contact you with the appropriate steps. If the clinic does not contact you assume the results are satisfactory. You can always view your results on My Chart. If you have questions regarding your health or results, please contact the clinic during office hours. You can also ask questions on My Chart.  We at [facility] are grateful that you chose us  to provide your care. We strive to provide evidence-based and compassionate care and are always looking for feedback. If you get a survey from the clinic please complete this so we can hear your opinions.  DASH Eating Plan DASH stands for Dietary Approaches to Stop Hypertension. The DASH eating plan is a healthy eating plan that has been shown to: Lower high blood pressure (hypertension). Reduce your risk for type 2 diabetes, heart disease, and stroke. Help with weight loss. What are tips for following this plan? Reading food labels Check food labels for the amount of salt (sodium) per serving. Choose foods with less than 5 percent of the Daily Value (DV) of sodium. In general, foods with less than 300 milligrams (mg) of sodium per serving fit into this eating plan. To find whole grains, look for the word whole as the first word in the ingredient list. Shopping Buy products labeled as low-sodium or no salt added. Buy fresh foods. Avoid canned  foods and pre-made or frozen meals. Cooking Try not to add salt when you cook. Use salt-free seasonings or herbs instead of table salt or sea salt. Check with your health care provider or pharmacist before using salt substitutes. Do not fry foods. Cook foods in healthy ways, such as baking, boiling, grilling, roasting, or broiling. Cook using oils that are good for your heart. These include olive, canola, avocado, soybean, and sunflower oil. Meal planning  Eat a balanced diet. This should include: 4 or more servings of fruits and 4 or more servings of vegetables each day. Try to fill half of your plate with fruits and vegetables. 6-8 servings of whole grains each day. 6 or less servings of lean meat, poultry, or fish each day. 1 oz is 1 serving. A 3 oz (85 g) serving of meat is about the same size as the palm of your hand. One egg is 1 oz (28 g). 2-3 servings of low-fat dairy each day. One serving is 1 cup (237 mL). 1 serving of nuts, seeds, or beans 5 times each week. 2-3 servings of heart-healthy fats. Healthy fats called omega-3 fatty acids are found in foods such as walnuts, flaxseeds, fortified milks, and eggs. These fats are also found in cold-water fish, such as sardines, salmon, and mackerel. Limit how much you eat of: Canned or prepackaged foods. Food that is high in trans fat, such as fried foods. Food that is high in saturated fat, such as fatty meat. Desserts and other sweets, sugary drinks, and other foods with added sugar. Full-fat  dairy products. Do not salt foods before eating. Do not eat more than 4 egg yolks a week. Try to eat at least 2 vegetarian meals a week. Eat more home-cooked food and less restaurant, buffet, and fast food. Lifestyle When eating at a restaurant, ask if your food can be made with less salt or no salt. If you drink alcohol: Limit how much you have to: 0-1 drink a day if you are female. 0-2 drinks a day if you are female. Know how much alcohol is in  your drink. In the [place] one drink is one 12 oz bottle of beer (355 mL), one 5 oz glass of wine (148 mL), or one 1 oz glass of hard liquor (44 mL). General information Avoid eating more than 2,300 mg of salt a day. If you have hypertension, you may need to reduce your sodium intake to 1,500 mg a day. Work with your provider to stay at a healthy body weight or lose weight. Ask what the best weight range is for you. On most days of the week, get at least 30 minutes of exercise that causes your heart to beat faster. This may include walking, swimming, or biking. Work with your provider or dietitian to adjust your eating plan to meet your specific calorie needs. What foods should I eat? Fruits All fresh, dried, or frozen fruit. Canned fruits that are in their natural juice and do not have sugar added to them. Vegetables Fresh or frozen vegetables that are raw, steamed, roasted, or grilled. Low-sodium or reduced-sodium tomato and vegetable juice. Low-sodium or reduced-sodium tomato sauce and tomato paste. Low-sodium or reduced-sodium canned vegetables. Grains Whole-grain or whole-wheat bread. Whole-grain or whole-wheat pasta. Brown rice. [place]neil [place]. Bulgur. Whole-grain and low-sodium cereals. Pita bread. Low-fat, low-sodium crackers. Whole-wheat flour tortillas. Meats and other proteins Skinless chicken or [place]. Ground chicken or [place]. Pork with fat trimmed off. Fish and seafood. Egg whites. Dried beans, peas, or lentils. Unsalted nuts, nut butters, and seeds. Unsalted canned beans. Lean cuts of beef with fat trimmed off. Low-sodium, lean precooked or cured meat, such as sausages or meat loaves. Dairy Low-fat (1%) or fat-free (skim) milk. Reduced-fat, low-fat, or fat-free cheeses. Nonfat, low-sodium ricotta or cottage cheese. Low-fat or nonfat yogurt. Low-fat, low-sodium cheese. Fats and oils Soft margarine without trans fats. Vegetable oil. Reduced-fat, low-fat, or light mayonnaise and salad  dressings (reduced-sodium). Canola, safflower, olive, avocado, soybean, and sunflower oils. Avocado. Seasonings and condiments Herbs. Spices. Seasoning mixes without salt. Other foods Unsalted popcorn and pretzels. Fat-free sweets. The items listed above may not be all the foods and drinks you can have. Talk to a dietitian to learn more. What foods should I avoid? Fruits Canned fruit in a light or heavy syrup. Fried fruit. Fruit in cream or butter sauce. Vegetables Creamed or fried vegetables. Vegetables in a cheese sauce. Regular canned vegetables that are not marked as low-sodium or reduced-sodium. Regular canned tomato sauce and paste that are not marked as low-sodium or reduced-sodium. Regular tomato and vegetable juices that are not marked as low-sodium or reduced-sodium. Dene. Olives. Grains Baked goods made with fat, such as croissants, muffins, or some breads. Dry pasta or rice meal packs. Meats and other proteins Fatty cuts of meat. Ribs. Fried meat. [place]. Bologna, salami, and other precooked or cured meats, such as sausages or meat loaves, that are not lean and low in sodium. Fat from the back of a pig (fatback). Bratwurst. Salted nuts and seeds. Canned beans with added salt. Canned  or smoked fish. Whole eggs or egg yolks. Chicken or [place] with skin. Dairy Whole or 2% milk, cream, and half-and-half. Whole or full-fat cream cheese. Whole-fat or sweetened yogurt. Full-fat cheese. Nondairy creamers. Whipped toppings. Processed cheese and cheese spreads. Fats and oils Butter. Stick margarine. Lard. Shortening. Ghee. Bacon fat. Tropical oils, such as coconut, palm kernel, or palm oil. Seasonings and condiments Onion salt, garlic salt, seasoned salt, table salt, and sea salt. Worcestershire sauce. Tartar sauce. Barbecue sauce. Teriyaki sauce. Soy sauce, including reduced-sodium soy sauce. Steak sauce. Canned and packaged gravies. Fish sauce. Oyster sauce. Cocktail sauce. Store-bought  horseradish. Ketchup. Mustard. Meat flavorings and tenderizers. Bouillon cubes. Hot sauces. Pre-made or packaged marinades. Pre-made or packaged taco seasonings. Relishes. Regular salad dressings. Other foods Salted popcorn and pretzels. The items listed above may not be all the foods and drinks you should avoid. Talk to a dietitian to learn more. Where to find more information National Heart, Lung, and Blood Institute (NHLBI): [place]DryCleaner.gl American Heart Association (AHA): heart.org Academy of Nutrition and Dietetics: eatright.org National Kidney Foundation (NKF): kidney.org This information is not intended to replace advice given to you by your health care provider. Make sure you discuss any questions you have with your health care provider. Document Revised: 05/11/2022 Document Reviewed: 05/11/2022 Elsevier Patient Education  2024 [employer].

## 2024-02-20 ENCOUNTER — Encounter: Payer: Self-pay | Admitting: Nurse Practitioner

## 2024-02-20 ENCOUNTER — Ambulatory Visit (INDEPENDENT_AMBULATORY_CARE_PROVIDER_SITE_OTHER): Admitting: Nurse Practitioner

## 2024-02-20 VITALS — BP 116/80 | HR 79 | Temp 98.1°F | Resp 16 | Ht 68.31 in | Wt 178.0 lb

## 2024-02-20 DIAGNOSIS — N1831 Chronic kidney disease, stage 3a: Secondary | ICD-10-CM

## 2024-02-20 DIAGNOSIS — Z1231 Encounter for screening mammogram for malignant neoplasm of breast: Secondary | ICD-10-CM

## 2024-02-20 DIAGNOSIS — F332 Major depressive disorder, recurrent severe without psychotic features: Secondary | ICD-10-CM | POA: Diagnosis not present

## 2024-02-20 DIAGNOSIS — F411 Generalized anxiety disorder: Secondary | ICD-10-CM

## 2024-02-20 DIAGNOSIS — Z6827 Body mass index (BMI) 27.0-27.9, adult: Secondary | ICD-10-CM

## 2024-02-20 DIAGNOSIS — Z23 Encounter for immunization: Secondary | ICD-10-CM | POA: Diagnosis not present

## 2024-02-20 DIAGNOSIS — I10 Essential (primary) hypertension: Secondary | ICD-10-CM | POA: Diagnosis not present

## 2024-02-20 DIAGNOSIS — F1090 Alcohol use, unspecified, uncomplicated: Secondary | ICD-10-CM

## 2024-02-20 DIAGNOSIS — F5101 Primary insomnia: Secondary | ICD-10-CM

## 2024-02-20 DIAGNOSIS — Z79899 Other long term (current) drug therapy: Secondary | ICD-10-CM

## 2024-02-20 MED ORDER — BENAZEPRIL HCL 40 MG PO TABS: 40.0000 mg | ORAL_TABLET | Freq: Every day | ORAL | 3 refills | Status: AC

## 2024-02-20 MED ORDER — VENLAFAXINE HCL ER 75 MG PO CP24: 75.0000 mg | ORAL_CAPSULE | Freq: Every day | ORAL | 3 refills | Status: AC

## 2024-02-20 MED ORDER — TRAZODONE HCL 50 MG PO TABS: 25.0000 mg | ORAL_TABLET | Freq: Every evening | ORAL | 4 refills | Status: AC | PRN

## 2024-02-20 NOTE — Assessment & Plan Note (Signed)
 Refer to depression plan of care.

## 2024-02-20 NOTE — Assessment & Plan Note (Signed)
 Chronic, stable.  BP at goal today.  Continue current medication regimen and adjust as needed.  Monitor kidney function closely with this. Could consider addition of Amlodipine or BB if return of palpitations in future.  Recommend she monitor BP at home at least a few days a week and document.  DASH diet recommended.  LABS: BMP.

## 2024-02-20 NOTE — Assessment & Plan Note (Signed)
 Fluctuates on labs.  Continue Benazepril  for kidney protection.  Recommend increased water  intake at home and decrease Ibuprofen  use.  Recheck CMP and urine ALB at physical.

## 2024-02-20 NOTE — Assessment & Plan Note (Signed)
 Refer to depression plan of care.  Recommend complete cessation of use and attending AA meetings.

## 2024-02-20 NOTE — Assessment & Plan Note (Signed)
 Chronic, ongoing.  Continue Trazodone as offers benefit to sleep, recommend she take this nightly for full benefit.  Recommend focus on healthy sleep hygiene.   ?Maintain a regular sleep schedule, particularly a regular wake-up time in the morning ?Try not to force sleep ?Avoid caffeinated beverages after lunch ?Avoid alcohol near bedtime (eg, late afternoon and evening)  ?Avoid smoking or other nicotine intake, particularly during the evening ?Adjust the bedroom environment as needed to decrease stimuli (eg, reduce ambient light, turn off the television or radio) ?Avoid prolonged use of light-emitting screens (laptops, tablets, smartphones, ebooks) before bedtime  ?Resolve concerns or worries before bedtime ?Exercise regularly for at least 20 minutes, preferably more than four to five hours prior to bedtime  ?Avoid daytime naps, especially if they are longer than 20 to 30 minutes or occur late in the day

## 2024-02-20 NOTE — Assessment & Plan Note (Signed)
 Chronic, stable.  She denies SI/HI.  Will continue Effexor  and Wellbutrin , adjust as needed.  She tolerated this in past and it may helps her energy levels and anhedonia + reduction in alcohol use.  Recommend therapy via [facility] or [facility].  She has not used alcohol in some time.

## 2024-02-20 NOTE — Progress Notes (Signed)
 BP 116/80 (BP Location: Left Arm, Patient Position: Sitting, Cuff Size: Normal)   Pulse 79   Temp 98.1 F (36.7 C) (Oral)   Resp 16   Ht 5' 8.31 (1.735 m)   Wt 178 lb (80.7 kg)   LMP 03/06/2021 (Exact Date)   SpO2 98%   BMI 26.82 kg/m    Subjective:    Patient ID: Alexandra Le, female    DOB: [birth year], 53 y.o.   MRN: [id number]  HPI: Alexandra Le is a 53 y.o. female  Chief Complaint  Patient presents with   Follow-up   HYPERTENSION with Chronic Kidney Disease Taking HCTZ and Benazepril .  No statin therapy.   Hypertension status: exacerbated  Satisfied with current treatment? yes Duration of hypertension: chronic BP monitoring frequency: not checking BP range:  BP medication side effects:  no Medication compliance: good compliance Aspirin: no Recurrent headaches: no Visual changes: no Palpitations: no Dyspnea: no Chest pain: no Lower extremity edema: no Dizzy/lightheaded: no  The 10-year ASCVD risk score (Arnett DK, et al., 2019) is: 1.6%   Values used to calculate the score:     Age: 53 years     Clincally relevant sex: Female     Is Non-Hispanic African American: No     Diabetic: No     Tobacco smoker: No     Systolic Blood Pressure: 116 mmHg     Is BP treated: Yes     HDL Cholesterol: 79 mg/dL     Total Cholesterol: 233 mg/dL   DEPRESSION/ANXIETY Taking Effexor  XR 75 MG and Wellbutrin  XL 300 MG daily. Feels like mood is doing well. Still has lots of stressors with raising grandchildren. Has not drank alcohol in awhile.   Mood status: controlled Satisfied with current treatment?: yes Symptom severity: moderate  Duration of current treatment : chronic Side effects: no Medication compliance: good compliance Psychotherapy/counseling: yes in the past Depressed mood: no Anxious mood: no Anhedonia: no Significant weight loss or gain: no Insomnia: varies, does not take Trazodone  like should Fatigue: a little Feelings of  worthlessness or guilt: no Impaired concentration/indecisiveness: no Suicidal ideations: no Hopelessness: no Crying spells: no    02/20/2024    2:42 PM 09/28/2023    8:56 AM 08/22/2023    8:59 AM 12/01/2022    9:17 AM 08/04/2022   10:50 AM  Depression screen PHQ 2/9  Decreased Interest 0 2 2 0 0  Down, Depressed, Hopeless 0 1 2 0 0  PHQ - 2 Score 0 3 4 0 0  Altered sleeping 3 3 3 3 1   Tired, decreased energy 1 2 3 2 1   Change in appetite 0 1 2 0 1  Feeling bad or failure about yourself  0 1 1 0 0  Trouble concentrating 1 1 2 1 1   Moving slowly or fidgety/restless 0 1 1 1 1   Suicidal thoughts 0 0 0 0 0  PHQ-9 Score 5 12 16 7 5   Difficult doing work/chores Not difficult at all Somewhat difficult Very difficult Somewhat difficult Not difficult at all       02/20/2024    2:42 PM 09/28/2023    8:58 AM 08/22/2023    8:59 AM 12/01/2022    9:17 AM  GAD 7 : Generalized Anxiety Score  Nervous, Anxious, on Edge 0 1 1 0  Control/stop worrying 0 2 2 0  Worry too much - different things 0 2 2 0  Trouble relaxing 0 1 2 1   Restless 0  1 1 1   Easily annoyed or irritable 1 2 3 1   Afraid - awful might happen 0 0 0 0  Total GAD 7 Score 1 9 11 3   Anxiety Difficulty  Somewhat difficult Very difficult Not difficult at all     Relevant past medical, surgical, family and social history reviewed and updated as indicated. Interim medical history since our last visit reviewed. Allergies and medications reviewed and updated.  Review of Systems  Constitutional:  Negative for activity change, appetite change, diaphoresis, fatigue and fever.  Respiratory:  Negative for cough, chest tightness and shortness of breath.   Cardiovascular:  Negative for chest pain, palpitations and leg swelling.  Gastrointestinal: Negative.   Neurological: Negative.   Psychiatric/Behavioral:  Negative for decreased concentration, self-injury, sleep disturbance and suicidal ideas. The patient is not nervous/anxious.     Per  HPI unless specifically indicated above     Objective:    BP 116/80 (BP Location: Left Arm, Patient Position: Sitting, Cuff Size: Normal)   Pulse 79   Temp 98.1 F (36.7 C) (Oral)   Resp 16   Ht 5' 8.31 (1.735 m)   Wt 178 lb (80.7 kg)   LMP 03/06/2021 (Exact Date)   SpO2 98%   BMI 26.82 kg/m   Wt Readings from Last 3 Encounters:  02/20/24 178 lb (80.7 kg)  09/28/23 189 lb (85.7 kg)  08/22/23 191 lb 9.6 oz (86.9 kg)    Physical Exam Vitals and nursing note reviewed.  Constitutional:      General: She is awake. She is not in acute distress.    Appearance: She is well-developed and well-groomed. She is not ill-appearing or toxic-appearing.  HENT:     Head: Normocephalic.     Right Ear: Hearing and external ear normal.     Left Ear: Hearing and external ear normal.  Eyes:     General: Lids are normal.        Right eye: No discharge.        Left eye: No discharge.     Conjunctiva/sclera: Conjunctivae normal.     Pupils: Pupils are equal, round, and reactive to light.  Neck:     Thyroid: No thyromegaly.     Vascular: No carotid bruit.  Cardiovascular:     Rate and Rhythm: Normal rate and regular rhythm.     Heart sounds: Normal heart sounds. No murmur heard.    No gallop.  Pulmonary:     Effort: Pulmonary effort is normal. No accessory muscle usage or respiratory distress.     Breath sounds: Normal breath sounds.  Abdominal:     General: Bowel sounds are normal. There is no distension.     Palpations: Abdomen is soft.     Tenderness: There is no abdominal tenderness.  Musculoskeletal:     Cervical back: Normal range of motion and neck supple.     Right lower leg: No edema.     Left lower leg: No edema.  Lymphadenopathy:     Cervical: No cervical adenopathy.  Skin:    General: Skin is warm and dry.  Neurological:     Mental Status: She is alert and oriented to person, place, and time.     Deep Tendon Reflexes: Reflexes are normal and symmetric.     Reflex  Scores:      Brachioradialis reflexes are 2+ on the right side and 2+ on the left side.      Patellar reflexes are 2+ on the right side  and 2+ on the left side. Psychiatric:        Attention and Perception: Attention normal.        Mood and Affect: Mood normal.        Speech: Speech normal.        Behavior: Behavior normal. Behavior is cooperative.        Thought Content: Thought content normal.    Results for orders placed or performed in visit on 08/22/23  Microalbumin, Urine Waived   Collection Time: 08/22/23  9:56 AM  Result Value Ref Range   Microalb, Ur Waived 30 (H) 0 - 19 mg/L   Creatinine, Urine Waived 100 10 - 300 mg/dL   Microalb/Creat Ratio <30 <30 mg/g  CBC with Differential/Platelet   Collection Time: 08/22/23  9:57 AM  Result Value Ref Range   WBC 6.5 3.4 - 10.8 x10E3/uL   RBC 4.50 3.77 - 5.28 x10E6/uL   Hemoglobin 13.9 11.1 - 15.9 g/dL   Hematocrit 57.7 65.9 - 46.6 %   MCV 94 79 - 97 fL   MCH 30.9 26.6 - 33.0 pg   MCHC 32.9 31.5 - 35.7 g/dL   RDW 86.6 88.2 - 84.5 %   Platelets 289 150 - 450 x10E3/uL   Neutrophils 63 Not Estab. %   Lymphs 28 Not Estab. %   Monocytes 5 Not Estab. %   Eos 3 Not Estab. %   Basos 1 Not Estab. %   Neutrophils Absolute 4.1 1.4 - 7.0 x10E3/uL   Lymphocytes Absolute 1.8 0.7 - 3.1 x10E3/uL   Monocytes Absolute 0.4 0.1 - 0.9 x10E3/uL   EOS (ABSOLUTE) 0.2 0.0 - 0.4 x10E3/uL   Basophils Absolute 0.0 0.0 - 0.2 x10E3/uL   Immature Granulocytes 0 Not Estab. %   Immature Grans (Abs) 0.0 0.0 - 0.1 x10E3/uL  Comprehensive metabolic panel with GFR   Collection Time: 08/22/23  9:57 AM  Result Value Ref Range   Glucose 87 70 - 99 mg/dL   BUN 14 6 - 24 mg/dL   Creatinine, Ser 9.11 0.57 - 1.00 mg/dL   eGFR 78 >40 fO/fpw/8.26   BUN/Creatinine Ratio 16 9 - 23   Sodium 141 134 - 144 mmol/L   Potassium 4.6 3.5 - 5.2 mmol/L   Chloride 104 96 - 106 mmol/L   CO2 23 20 - 29 mmol/L   Calcium 9.8 8.7 - 10.2 mg/dL   Total Protein 7.1 6.0 - 8.5  g/dL   Albumin 4.4 3.8 - 4.9 g/dL   Globulin, Total 2.7 1.5 - 4.5 g/dL   Bilirubin Total 1.0 0.0 - 1.2 mg/dL   Alkaline Phosphatase 99 44 - 121 IU/L   AST 18 0 - 40 IU/L   ALT 12 0 - 32 IU/L  Lipid Panel w/o Chol/HDL Ratio   Collection Time: 08/22/23  9:57 AM  Result Value Ref Range   Cholesterol, Total 233 (H) 100 - 199 mg/dL   Triglycerides 881 0 - 149 mg/dL   HDL 79 >60 mg/dL   VLDL Cholesterol Cal 20 5 - 40 mg/dL   LDL Chol Calc (NIH) 865 (H) 0 - 99 mg/dL  VITAMIN D  25 Hydroxy (Vit-D Deficiency, Fractures)   Collection Time: 08/22/23  9:57 AM  Result Value Ref Range   Vit D, 25-Hydroxy 35.9 30.0 - 100.0 ng/mL  Gamma GT   Collection Time: 08/22/23  9:57 AM  Result Value Ref Range   GGT 23 0 - 60 IU/L      Assessment & Plan:  Problem List Items Addressed This Visit       Cardiovascular and Mediastinum   Hypertension   Chronic, stable.  BP at goal today.  Continue current medication regimen and adjust as needed.  Monitor kidney function closely with this. Could consider addition of Amlodipine or BB if return of palpitations in future.  Recommend she monitor BP at home at least a few days a week and document.  DASH diet recommended.  LABS: BMP.        Relevant Medications   benazepril  (LOTENSIN ) 40 MG tablet   Other Relevant Orders   Basic metabolic panel with GFR   235116 11+Oxyco+Alc+Crt-Bund     Genitourinary   Chronic kidney disease, stage 3a (HCC) - Primary   Fluctuates on labs.  Continue Benazepril  for kidney protection.  Recommend increased water  intake at home and decrease Ibuprofen  use.  Recheck CMP and urine ALB at physical.        Other   Insomnia   Chronic, ongoing.  Continue Trazodone  as offers benefit to sleep, recommend she take this nightly for full benefit.  Recommend focus on healthy sleep hygiene.   ?Maintain a regular sleep schedule, particularly a regular wake-up time in the morning ?Try not to force sleep ?Avoid caffeinated beverages after  lunch ?Avoid alcohol near bedtime (eg, late afternoon and evening)  ?Avoid smoking or other nicotine intake, particularly during the evening ?Adjust the bedroom environment as needed to decrease stimuli (eg, reduce ambient light, turn off the television or radio) ?Avoid prolonged use of light-emitting screens (laptops, tablets, smartphones, ebooks) before bedtime  ?Resolve concerns or worries before bedtime ?Exercise regularly for at least 20 minutes, preferably more than four to five hours prior to bedtime  ?Avoid daytime naps, especially if they are longer than 20 to 30 minutes or occur late in the day       Generalized anxiety disorder   Refer to depression plan of care.      Relevant Medications   traZODone  (DESYREL ) 50 MG tablet   venlafaxine  XR (EFFEXOR -XR) 75 MG 24 hr capsule   Depression   Chronic, stable.  She denies SI/HI.  Will continue Effexor  and Wellbutrin , adjust as needed.  She tolerated this in past and it may helps her energy levels and anhedonia + reduction in alcohol use.  Recommend therapy via [facility] or [facility].  She has not used alcohol in some time.      Relevant Medications   traZODone  (DESYREL ) 50 MG tablet   venlafaxine  XR (EFFEXOR -XR) 75 MG 24 hr capsule   BMI 27.0-27.9,adult   BMI 26.82, 13 lbs weight loss, praised for this.  Recommended eating smaller high protein, low fat meals more frequently and exercising 30 mins a day 5 times a week with a goal of 10-15lb weight loss in the next 3 months. Patient voiced their understanding and motivation to adhere to these recommendations.         Alcohol use   Refer to depression plan of care.  Recommend complete cessation of use and attending AA meetings.      Other Visit Diagnoses       High risk medication use       UDS due to rare Tramadol  use   Relevant Orders   235116 11+Oxyco+Alc+Crt-Bund     Pneumococcal vaccination given       PCV20 in office today, educated patient.   Relevant Orders    Pneumococcal conjugate vaccine 20-valent     Encounter for screening mammogram for  malignant neoplasm of breast       Mammogram ordered and instructed how to schedule.   Relevant Orders   MM 3D SCREENING MAMMOGRAM BILATERAL BREAST     Need for shingles vaccine       Shingrix # 1 in office today   Relevant Orders   Varicella-zoster vaccine IM        Follow up plan: Return in about 6 months (around 08/22/2024) for Annual Physical after 08/21/24.

## 2024-02-20 NOTE — Assessment & Plan Note (Addendum)
 BMI 26.82, 13 lbs weight loss, praised for this.  Recommended eating smaller high protein, low fat meals more frequently and exercising 30 mins a day 5 times a week with a goal of 10-15lb weight loss in the next 3 months. Patient voiced their understanding and motivation to adhere to these recommendations.

## 2024-02-21 ENCOUNTER — Ambulatory Visit: Payer: Self-pay | Admitting: Nurse Practitioner

## 2024-02-21 LAB — BASIC METABOLIC PANEL WITH GFR
BUN/Creatinine Ratio: 12 (ref 9–23)
BUN: 13 mg/dL (ref 6–24)
CO2: 24 mmol/L (ref 20–29)
Calcium: 10.2 mg/dL (ref 8.7–10.2)
Chloride: 99 mmol/L (ref 96–106)
Creatinine, Ser: 1.12 mg/dL — ABNORMAL HIGH (ref 0.57–1.00)
Glucose: 89 mg/dL (ref 70–99)
Potassium: 4.5 mmol/L (ref 3.5–5.2)
Sodium: 139 mmol/L (ref 134–144)
eGFR: 58 mL/min/1.73 — ABNORMAL LOW (ref >59)

## 2024-02-21 LAB — DRUG SCREEN 764883 11+OXYCO+ALC+CRT-BUND
Amphetamines, Urine: NEGATIVE ng/mL
BENZODIAZ UR QL: NEGATIVE ng/mL
Barbiturate screen, urine: NEGATIVE ng/mL
Cannabinoid Quant, Ur: NEGATIVE ng/mL
Cocaine (Metab.): NEGATIVE ng/mL
Creatinine, Urine: 91.5 mg/dL (ref 20.0–300.0)
Ethanol, Urine: NEGATIVE %
Meperidine: NEGATIVE ng/mL
Methadone Screen, Urine: NEGATIVE ng/mL
Nitrite Urine, Quantitative: NEGATIVE ug/mL
OPIATE SCREEN URINE: NEGATIVE ng/mL
Oxycodone/Oxymorphone, Urine: NEGATIVE ng/mL
PCP Quant, Ur: NEGATIVE ng/mL
Propoxyphene: NEGATIVE ng/mL
Tramadol: NEGATIVE ng/mL
pH, Urine: 7.1 (ref 4.5–8.9)

## 2024-02-21 NOTE — Progress Notes (Signed)
 Contacted via MyChart  Good morning Eline, your labs have returned and kidney function did trend down a little this check.  Continue all current medications and ensure good water  intake daily.  Any questions? Keep being amazing!!  Thank you for allowing me to participate in your care.  I appreciate you. Kindest regards, Lucero Auzenne

## 2024-02-29 ENCOUNTER — Ambulatory Visit
Admission: RE | Admit: 2024-02-29 | Discharge: 2024-02-29 | Disposition: A | Discharge status: Home / Self Care | Source: Ambulatory Visit | Attending: Nurse Practitioner | Admitting: Nurse Practitioner

## 2024-02-29 DIAGNOSIS — Z1231 Encounter for screening mammogram for malignant neoplasm of breast: Secondary | ICD-10-CM

## 2024-03-03 ENCOUNTER — Other Ambulatory Visit: Payer: Self-pay | Admitting: Nurse Practitioner

## 2024-03-05 NOTE — Telephone Encounter (Signed)
 Duplicate request, refilled 02/20/24.  Requested Prescriptions  Pending Prescriptions Disp Refills   venlafaxine  XR (EFFEXOR -XR) 75 MG 24 hr capsule [Pharmacy Med Name: Venlafaxine  HCl ER 75 MG Oral Capsule Extended Release 24 Hour] 90 capsule 3    Sig: TAKE 1 CAPSULE BY MOUTH DAILY  WITH BREAKFAST     Psychiatry: Antidepressants - SNRI - desvenlafaxine & venlafaxine  Failed - 03/05/2024 12:01 PM      Failed - Cr in normal range and within 360 days    Creatinine  Date Value Ref Range Status  12/01/2022 220.6 20.0 - 300.0 mg/dL Final   Creatinine, Ser  Date Value Ref Range Status  02/20/2024 1.12 (H) 0.57 - 1.00 mg/dL Final         Failed - Lipid Panel in normal range within the last 12 months    Cholesterol, Total  Date Value Ref Range Status  08/22/2023 233 (H) 100 - 199 mg/dL Final   LDL Chol Calc (NIH)  Date Value Ref Range Status  08/22/2023 134 (H) 0 - 99 mg/dL Final   HDL  Date Value Ref Range Status  08/22/2023 79 >39 mg/dL Final   Triglycerides  Date Value Ref Range Status  08/22/2023 118 0 - 149 mg/dL Final         Passed - Completed PHQ-2 or PHQ-9 in the last 360 days      Passed - Last BP in normal range    BP Readings from Last 1 Encounters:  02/20/24 116/80         Passed - Valid encounter within last 6 months    Recent Outpatient Visits           2 weeks ago Chronic kidney disease, stage 3a (HCC)   [place] [facility] [place], Jolene T, NP   5 months ago Severe episode of recurrent major depressive disorder, without psychotic features (HCC)   [place] [facility] [place], Jolene T, NP   6 months ago Severe episode of recurrent major depressive disorder, without psychotic features (HCC)   [facility] [place], Melanie DASEN, NP

## 2024-03-05 NOTE — Progress Notes (Signed)
 Contacted via MyChart   Normal mammogram, may repeat in one year:)

## 2024-03-10 ENCOUNTER — Encounter: Payer: Self-pay | Admitting: Nurse Practitioner

## 2024-03-12 NOTE — Telephone Encounter (Signed)
 Form pending for provider to complete.

## 2024-05-24 ENCOUNTER — Encounter: Payer: Self-pay | Admitting: Nurse Practitioner

## 2024-05-26 MED ORDER — HYDROCHLOROTHIAZIDE 25 MG PO TABS: 25.0000 mg | ORAL_TABLET | Freq: Every day | ORAL | 1 refills | Status: AC

## 2024-06-06 ENCOUNTER — Encounter: Payer: Self-pay | Admitting: Nurse Practitioner

## 2024-06-06 MED ORDER — NALTREXONE HCL 50 MG PO TABS: ORAL_TABLET | ORAL | 2 refills | Status: AC

## 2024-08-22 ENCOUNTER — Encounter: Admitting: Nurse Practitioner
